# Patient Record
Sex: Female | Born: 1950
Health system: Southern US, Community
[De-identification: ages and names within clinical notes are randomized; demographics above are authoritative.]

## PROBLEM LIST (undated history)

## (undated) DIAGNOSIS — J45909 Unspecified asthma, uncomplicated: Secondary | ICD-10-CM

## (undated) DIAGNOSIS — R51 Headache: Secondary | ICD-10-CM

## (undated) DIAGNOSIS — J189 Pneumonia, unspecified organism: Secondary | ICD-10-CM

## (undated) DIAGNOSIS — R0602 Shortness of breath: Secondary | ICD-10-CM

## (undated) DIAGNOSIS — Z9889 Other specified postprocedural states: Secondary | ICD-10-CM

## (undated) DIAGNOSIS — R112 Nausea with vomiting, unspecified: Secondary | ICD-10-CM

## (undated) DIAGNOSIS — K219 Gastro-esophageal reflux disease without esophagitis: Secondary | ICD-10-CM

## (undated) HISTORY — PX: KNEE SURGERY: SHX244

## (undated) HISTORY — DX: Unspecified asthma, uncomplicated: J45.909

## (undated) HISTORY — PX: ABDOMINAL HYSTERECTOMY: SHX81

## (undated) HISTORY — PX: CHOLECYSTECTOMY: SHX55

## (undated) HISTORY — PX: RHINOPLASTY: SHX2354

---
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
## 1999-04-16 ENCOUNTER — Other Ambulatory Visit: Admission: RE | Admit: 1999-04-16 | Discharge: 1999-04-16 | Payer: Self-pay | Admitting: *Deleted

## 1999-12-31 ENCOUNTER — Encounter: Payer: Self-pay | Admitting: Orthopedic Surgery

## 1999-12-31 ENCOUNTER — Ambulatory Visit (HOSPITAL_COMMUNITY): Admission: RE | Admit: 1999-12-31 | Discharge: 1999-12-31 | Payer: Self-pay | Admitting: Orthopedic Surgery

## 2004-02-20 ENCOUNTER — Other Ambulatory Visit: Admission: RE | Admit: 2004-02-20 | Discharge: 2004-02-20 | Payer: Self-pay | Admitting: *Deleted

## 2005-04-14 ENCOUNTER — Encounter: Admission: RE | Admit: 2005-04-14 | Discharge: 2005-04-14 | Payer: Self-pay | Admitting: Family Medicine

## 2005-04-20 ENCOUNTER — Encounter: Admission: RE | Admit: 2005-04-20 | Discharge: 2005-04-20 | Payer: Self-pay | Admitting: General Surgery

## 2005-05-10 ENCOUNTER — Ambulatory Visit (HOSPITAL_COMMUNITY): Admission: RE | Admit: 2005-05-10 | Discharge: 2005-05-11 | Payer: Self-pay | Admitting: General Surgery

## 2010-03-10 LAB — HM COLONOSCOPY

## 2011-12-09 ENCOUNTER — Encounter (HOSPITAL_COMMUNITY): Payer: Self-pay | Admitting: Emergency Medicine

## 2011-12-09 ENCOUNTER — Emergency Department (HOSPITAL_COMMUNITY)
Admission: EM | Admit: 2011-12-09 | Discharge: 2011-12-09 | Disposition: A | Discharge status: Nursing Home (Includes ICF) | Payer: 59 | Source: Home / Self Care

## 2011-12-09 ENCOUNTER — Inpatient Hospital Stay (HOSPITAL_COMMUNITY)
Admission: EM | Admit: 2011-12-09 | Discharge: 2011-12-11 | DRG: 195 | Disposition: A | Discharge status: Home / Self Care | Payer: 59 | Attending: Internal Medicine | Admitting: Internal Medicine

## 2011-12-09 ENCOUNTER — Emergency Department (INDEPENDENT_AMBULATORY_CARE_PROVIDER_SITE_OTHER): Payer: 59

## 2011-12-09 DIAGNOSIS — J189 Pneumonia, unspecified organism: Secondary | ICD-10-CM

## 2011-12-09 DIAGNOSIS — K219 Gastro-esophageal reflux disease without esophagitis: Secondary | ICD-10-CM | POA: Diagnosis present

## 2011-12-09 DIAGNOSIS — D72829 Elevated white blood cell count, unspecified: Secondary | ICD-10-CM | POA: Diagnosis present

## 2011-12-09 DIAGNOSIS — J154 Pneumonia due to other streptococci: Principal | ICD-10-CM | POA: Diagnosis present

## 2011-12-09 DIAGNOSIS — J45909 Unspecified asthma, uncomplicated: Secondary | ICD-10-CM | POA: Diagnosis present

## 2011-12-09 HISTORY — DX: Nausea with vomiting, unspecified: R11.2

## 2011-12-09 HISTORY — DX: Headache: R51

## 2011-12-09 HISTORY — DX: Gastro-esophageal reflux disease without esophagitis: K21.9

## 2011-12-09 HISTORY — DX: Nausea with vomiting, unspecified: Z98.890

## 2011-12-09 HISTORY — DX: Shortness of breath: R06.02

## 2011-12-09 HISTORY — DX: Pneumonia, unspecified organism: J18.9

## 2011-12-09 LAB — CBC
MCH: 30.6 pg (ref 26.0–34.0)
MCHC: 33.6 g/dL (ref 30.0–36.0)
MCV: 91.2 fL (ref 78.0–100.0)
Platelets: 163 10*3/uL (ref 150–400)
RBC: 4.34 MIL/uL (ref 3.87–5.11)

## 2011-12-09 LAB — DIFFERENTIAL
Basophils Relative: 0 % (ref 0–1)
Eosinophils Absolute: 0 10*3/uL (ref 0.0–0.7)
Eosinophils Relative: 0 % (ref 0–5)
Lymphs Abs: 0.5 10*3/uL — ABNORMAL LOW (ref 0.7–4.0)
Monocytes Relative: 5 % (ref 3–12)

## 2011-12-09 LAB — BASIC METABOLIC PANEL
BUN: 19 mg/dL (ref 6–23)
Calcium: 10.2 mg/dL (ref 8.4–10.5)
GFR calc non Af Amer: 90 mL/min — ABNORMAL LOW (ref >90)
Glucose, Bld: 159 mg/dL — ABNORMAL HIGH (ref 70–99)
Sodium: 135 mEq/L (ref 135–145)

## 2011-12-09 NOTE — ED Notes (Signed)
PT. TRANSFERRED FROM [facility] THIS EVENING - CHEST X-RAY RESULT SHOWS RLL PNEUMONIA , PERSISTENT COUGH FOR 4 WEEKS/SLIGHT SOB , SEEN AT [facility] TODAY RECEIVED NEBULIZER TREATMENT / STEROID INJECTION AND PRESCRIPTION FOR Z-PACK AND PREDNISONE.

## 2011-12-09 NOTE — ED Notes (Signed)
Seen at [facility] today.  Coughing and phlegm for 4 weeks.  Today had an episode of feeling very tired and near syncopal episode while running around doing errands.  No wheezing, has had fever today.  Right lower back pain with attempts to take deep inspirations, also has stuffy ears.

## 2011-12-09 NOTE — ED Notes (Signed)
Offered blankets, pillows, sprite/ice, graham crackers

## 2011-12-09 NOTE — ED Notes (Signed)
Patient received script for z-pack and prednisone.

## 2011-12-09 NOTE — ED Provider Notes (Signed)
History     CSN: [id number]  Arrival date & time 12/09/11  1735   None     Chief Complaint  Patient presents with  . URI    (Consider location/radiation/quality/duration/timing/severity/associated sxs/prior treatment) HPI Comments: Patient presents today requesting a chest x-ray. She states she has had a productive cough with yellow-green sputum for the last 4 weeks. She denies dyspnea but has had some intermittent wheezing. She does have a history of asthma. She states that she was running to [place] today after work and became very lightheaded and felt when she was going to pass out. She was seen at her primary care provider's office and treated with a nebulizer and given an injection of steroids. She states her pulse ox was low at 91%. They prescribed a Z-Pak and oral steroids for her. They also advised her to come to our urgent care to have a chest x-ray performed.   Past Medical History  Diagnosis Date  . Asthma     Past Surgical History  Procedure Date  . Cholecystectomy   . Abdominal hysterectomy   . Rhinoplasty     History reviewed. No pertinent family history.  History  Substance Use Topics  . Smoking status: Never Smoker   . Smokeless tobacco: Not on file  . Alcohol Use: Yes    OB History    Grav Para Term Preterm Abortions TAB SAB Ect Mult Living                  Review of Systems  Constitutional: Negative for fever, chills and fatigue.  HENT: Negative for ear pain, sore throat, rhinorrhea, sneezing, postnasal drip and sinus pressure.   Respiratory: Positive for cough and wheezing. Negative for shortness of breath.   Cardiovascular: Negative for chest pain.  Musculoskeletal: Positive for back pain (Rt mid, with deep inspiration).    Allergies  Codeine; Food; Penicillins; and Sulfa antibiotics  Home Medications   Current Outpatient Rx  Name Route Sig Dispense Refill  . VENTOLIN IN Inhalation Inhale into the lungs.    . ESOMEPRAZOLE MAGNESIUM 20 MG  PO CPDR Oral Take 20 mg by mouth daily before breakfast.    . FLUTICASONE PROPIONATE 50 MCG/ACT NA SUSP Nasal Place 2 sprays into the nose daily.    Marland Kitchen ADVAIR HFA IN Inhalation Inhale into the lungs.    . IBANDRONATE SODIUM 150 MG PO TABS Oral Take 150 mg by mouth every 30 (thirty) days. Take in the morning with a full glass of water, on an empty stomach, and do not take anything else by mouth or lie down for the next 30 min.    Marland Kitchen LORATADINE-D 12HR PO Oral Take by mouth.      BP 122/64  Pulse 102  Temp(Src) 99.2 F (37.3 C) (Oral)  Resp 20  SpO2 94%  Physical Exam  Nursing note and vitals reviewed. Constitutional: She appears well-developed and well-nourished. No distress.  HENT:  Head: Normocephalic and atraumatic.  Right Ear: Tympanic membrane, external ear and ear canal normal.  Left Ear: Tympanic membrane, external ear and ear canal normal.  Nose: Nose normal.  Mouth/Throat: Uvula is midline, oropharynx is clear and moist and mucous membranes are normal. No oropharyngeal exudate, posterior oropharyngeal edema or posterior oropharyngeal erythema.  Neck: Neck supple.  Cardiovascular: Normal rate, regular rhythm and normal heart sounds.   Pulmonary/Chest: Effort normal and breath sounds normal. No respiratory distress.  Lymphadenopathy:    She has no cervical adenopathy.  Neurological: She is  alert.  Skin: Skin is warm and dry.  Psychiatric: She has a normal mood and affect.    ED Course  Procedures (including critical care time)  Labs Reviewed - No data to display Dg Chest 2 View  12/09/2011  *RADIOLOGY REPORT*  Clinical Data: 4-week history of malaise.  Cough with fever.  CHEST - 2 VIEW  Comparison: Chest x-ray 05/04/2005.  Findings: Borderline cardiac enlargement.  Low lung volumes.  Plate- like atelectasis left base.  Focal opacity along the right heart border not present previously likely represents pneumonia.  This may involve the right middle and right lower lobes.  There  is no significant effusion.  There is no visible pneumothorax. Unremarkable aorta.  No acute osseous findings.  IMPRESSION: Interval worsening aeration since 2006.  Right lower lung zone process suspicious for right middle and right lower lobe pneumonia. Mild left base atelectasis.  Original Report Authenticated By: Elsie Stain, M.D.     1. Community acquired pneumonia       MDM  CXR reviewed by radiologist and myself. Pt transferred to ED.        Melody Comas, [place] 12/09/11 2038

## 2011-12-10 ENCOUNTER — Encounter (HOSPITAL_COMMUNITY): Payer: Self-pay | Admitting: Internal Medicine

## 2011-12-10 DIAGNOSIS — J189 Pneumonia, unspecified organism: Secondary | ICD-10-CM | POA: Diagnosis present

## 2011-12-10 DIAGNOSIS — D72829 Elevated white blood cell count, unspecified: Secondary | ICD-10-CM | POA: Diagnosis present

## 2011-12-10 DIAGNOSIS — J45909 Unspecified asthma, uncomplicated: Secondary | ICD-10-CM | POA: Diagnosis present

## 2011-12-10 LAB — COMPREHENSIVE METABOLIC PANEL
ALT: 16 U/L (ref 0–35)
Albumin: 3.5 g/dL (ref 3.5–5.2)
Calcium: 9.7 mg/dL (ref 8.4–10.5)
GFR calc Af Amer: >90 mL/min (ref >90)
Glucose, Bld: 127 mg/dL — ABNORMAL HIGH (ref 70–99)
Potassium: 3.8 mEq/L (ref 3.5–5.1)
Sodium: 135 mEq/L (ref 135–145)
Total Protein: 6.1 g/dL (ref 6.0–8.3)

## 2011-12-10 LAB — CBC
HCT: 34.7 % — ABNORMAL LOW (ref 36.0–46.0)
Hemoglobin: 11.9 g/dL — ABNORMAL LOW (ref 12.0–15.0)
MCV: 91.6 fL (ref 78.0–100.0)
WBC: 12.9 10*3/uL — ABNORMAL HIGH (ref 4.0–10.5)

## 2011-12-10 LAB — DIFFERENTIAL
Eosinophils Relative: 0 % (ref 0–5)
Lymphocytes Relative: 4 % — ABNORMAL LOW (ref 12–46)
Monocytes Absolute: 0.5 10*3/uL (ref 0.1–1.0)
Monocytes Relative: 4 % (ref 3–12)
Neutro Abs: 11.9 10*3/uL — ABNORMAL HIGH (ref 1.7–7.7)

## 2011-12-10 LAB — EXPECTORATED SPUTUM ASSESSMENT W GRAM STAIN, RFLX TO RESP C

## 2011-12-10 MED ORDER — ENOXAPARIN SODIUM 40 MG/0.4ML ~~LOC~~ SOLN
40.0000 mg | SUBCUTANEOUS | Status: DC
  Administered 2011-12-10: 40 mg via SUBCUTANEOUS
  Filled 2011-12-10 (×4): qty 0.4

## 2011-12-10 MED ORDER — LORATADINE 10 MG PO TABS
5.0000 mg | ORAL_TABLET | Freq: Every day | ORAL | Status: DC
  Administered 2011-12-10: 5 mg via ORAL
  Administered 2011-12-11: 11:00:00 via ORAL
  Filled 2011-12-10 (×2): qty 0.5

## 2011-12-10 MED ORDER — ADULT MULTIVITAMIN W/MINERALS CH
1.0000 | ORAL_TABLET | Freq: Every day | ORAL | Status: DC
  Administered 2011-12-10 – 2011-12-11 (×2): 1 via ORAL
  Filled 2011-12-10 (×2): qty 1

## 2011-12-10 MED ORDER — FLUTICASONE-SALMETEROL 250-50 MCG/DOSE IN AEPB
1.0000 | INHALATION_SPRAY | Freq: Two times a day (BID) | RESPIRATORY_TRACT | Status: DC
  Administered 2011-12-10 – 2011-12-11 (×2): 1 via RESPIRATORY_TRACT
  Filled 2011-12-10: qty 14

## 2011-12-10 MED ORDER — SODIUM CHLORIDE 0.9 % IV SOLN
INTRAVENOUS | Status: DC
  Administered 2011-12-10: 75 mL/h via INTRAVENOUS

## 2011-12-10 MED ORDER — LORATADINE-PSEUDOEPHEDRINE ER 5-120 MG PO TB12: 1.0000 | ORAL_TABLET | Freq: Every morning | ORAL | Status: DC

## 2011-12-10 MED ORDER — ONDANSETRON HCL 4 MG/2ML IJ SOLN: 4.0000 mg | Freq: Four times a day (QID) | INTRAMUSCULAR | Status: DC | PRN

## 2011-12-10 MED ORDER — ACETAMINOPHEN 325 MG PO TABS
650.0000 mg | ORAL_TABLET | Freq: Four times a day (QID) | ORAL | Status: DC | PRN
  Administered 2011-12-10: 650 mg via ORAL
  Filled 2011-12-10: qty 2

## 2011-12-10 MED ORDER — ALBUTEROL SULFATE HFA 108 (90 BASE) MCG/ACT IN AERS
2.0000 | INHALATION_SPRAY | Freq: Four times a day (QID) | RESPIRATORY_TRACT | Status: DC | PRN
  Filled 2011-12-10: qty 6.7

## 2011-12-10 MED ORDER — IBANDRONATE SODIUM 150 MG PO TABS: 150.0000 mg | ORAL_TABLET | ORAL | Status: DC

## 2011-12-10 MED ORDER — ALBUTEROL SULFATE (5 MG/ML) 0.5% IN NEBU
5.0000 mg | INHALATION_SOLUTION | RESPIRATORY_TRACT | Status: DC | PRN
  Administered 2011-12-10 (×2): 2.5 mg via RESPIRATORY_TRACT
  Administered 2011-12-11: 5 mg via RESPIRATORY_TRACT
  Filled 2011-12-10 (×3): qty 0.5

## 2011-12-10 MED ORDER — MOXIFLOXACIN HCL IN NACL 400 MG/250ML IV SOLN
400.0000 mg | Freq: Once | INTRAVENOUS | Status: AC
  Administered 2011-12-10: 400 mg via INTRAVENOUS
  Filled 2011-12-10: qty 250

## 2011-12-10 MED ORDER — LORATADINE 10 MG PO TABS: 5.0000 mg | ORAL_TABLET | Freq: Every day | ORAL | Status: DC

## 2011-12-10 MED ORDER — PANTOPRAZOLE SODIUM 40 MG PO TBEC
40.0000 mg | DELAYED_RELEASE_TABLET | Freq: Every day | ORAL | Status: DC
  Administered 2011-12-10 – 2011-12-11 (×2): 40 mg via ORAL
  Filled 2011-12-10 (×2): qty 1

## 2011-12-10 MED ORDER — FLUTICASONE PROPIONATE 50 MCG/ACT NA SUSP
2.0000 | Freq: Every day | NASAL | Status: DC | PRN
  Filled 2011-12-10: qty 16

## 2011-12-10 MED ORDER — PSEUDOEPHEDRINE HCL ER 120 MG PO TB12
120.0000 mg | ORAL_TABLET | Freq: Every day | ORAL | Status: DC
  Administered 2011-12-10 – 2011-12-11 (×2): 120 mg via ORAL
  Filled 2011-12-10 (×2): qty 1

## 2011-12-10 MED ORDER — LEVOFLOXACIN IN D5W 750 MG/150ML IV SOLN
750.0000 mg | INTRAVENOUS | Status: DC
  Administered 2011-12-11: 750 mg via INTRAVENOUS
  Filled 2011-12-10 (×3): qty 150

## 2011-12-10 MED ORDER — PSEUDOEPHEDRINE HCL ER 120 MG PO TB12
120.0000 mg | ORAL_TABLET | Freq: Two times a day (BID) | ORAL | Status: DC
  Filled 2011-12-10 (×2): qty 1

## 2011-12-10 NOTE — Progress Notes (Signed)
PHARMACIST - PHYSICIAN COMMUNICATION  CONCERNING: P&T Medication Policy Regarding Oral Bisphosphonates  RECOMMENDATION: Your order for alendronate (Fosamax), ibandronate (Boniva), or risedronate (Actonel) has been discontinued at this time.  If the patient's post-hospital medical condition warrants safe use of this class of drugs, please resume the pre-hospital regimen upon discharge.  DESCRIPTION:  Alendronate (Fosamax), ibandronate (Boniva), and risedronate (Actonel) can cause severe esophageal erosions in patients who are unable to remain upright at least 30 minutes after taking this medication.   Since brief interruptions in therapy are thought to have minimal impact on bone mineral density, the Pharmacy & Therapeutics Committee has established that bisphosphonate orders should be routinely discontinued during hospitalization.   To override this safety policy and permit administration of Boniva, Fosamax, or Actonel in the hospital, prescribers must write "DO NOT HOLD" in the comments section when placing the order for this class of medications.   Karver Fadden Jonathan  

## 2011-12-10 NOTE — Progress Notes (Signed)
12/10/2011 Newel Oien [facility] Case Management Note [phone]  Utilization review completed.  

## 2011-12-10 NOTE — Progress Notes (Signed)
ANTIBIOTIC CONSULT NOTE - FOLLOW UP  Pharmacy Consult for Rx to renally adjust antibiotics (levofloxacin) Indication: pneumonia  Allergies  Allergen Reactions  . Codeine   . Food   . Penicillins   . Sulfa Antibiotics     Patient Measurements: Weight: 212 lb 11.9 oz (96.5 kg) Adjusted Body Weight:   Vital Signs: Temp: 99.7 F (37.6 C) (03/29 0508) Temp src: Oral (03/29 0508) BP: 104/58 mmHg (03/29 0508) Pulse Rate: 87  (03/29 0508) Intake/Output from previous day: 03/28 0701 - 03/29 0700 In: 120 [P.O.:120] Out: 650 [Urine:650] Intake/Output from this shift:    Labs:  Basename 12/10/11 0328 12/09/11 2136  WBC 12.9* 15.7*  HGB 11.9* 13.3  PLT 141* 163  LABCREA -- --  CREATININE 0.74 0.76   CrCl is unknown because there is no height on file for the current visit. No results found for this basename: VANCOTROUGH:2,VANCOPEAK:2,VANCORANDOM:2,GENTTROUGH:2,GENTPEAK:2,GENTRANDOM:2,TOBRATROUGH:2,TOBRAPEAK:2,TOBRARND:2,AMIKACINPEAK:2,AMIKACINTROU:2,AMIKACIN:2, in the last 72 hours   Microbiology: No results found for this or any previous visit (from the past 720 hour(s)).  Anti-infectives     Start     Dose/Rate Route Frequency Ordered Stop   12/11/11 0000   levofloxacin (LEVAQUIN) IVPB 750 mg        750 mg 100 mL/hr over 90 Minutes Intravenous Every 24 hours 12/10/11 0314 12/15/11 2359   12/10/11 0015   moxifloxacin (AVELOX) IVPB 400 mg        400 mg 250 mL/hr over 60 Minutes Intravenous  Once 12/10/11 0012 12/10/11 0153          Assessment: 60 YOF admitted SOB, Cough, Fever.  Started levofloxacin 750mg  IV q24h for CAP.  S. Pneumoniae urine Ag is positive. BC pending.  WBC inc at admit, afeb, renal fx WNL.    Plan:  1. Continue levofoxacin 750mg  IV q24h, await blood cultures. Rx to follow at distance as no intervention needed at this time.   Dannielle Huh 12/10/2011,9:10 AM

## 2011-12-10 NOTE — Progress Notes (Signed)
Subjective: Patient seen and examined this morning. informs her breathing to be better  Objective:  Vital signs in last 24 hours:  Filed Vitals:   12/10/11 0215 12/10/11 0254 12/10/11 0508 12/10/11 0917  BP: 112/61 114/71 104/58   Pulse: 87 91 87   Temp:  99.9 F (37.7 C) 99.7 F (37.6 C)   TempSrc:  Oral Oral   Resp:  20 20   Weight:  96.5 kg (212 lb 11.9 oz)    SpO2: 98% 98% 97% 97%    Intake/Output from previous day:   Intake/Output Summary (Last 24 hours) at 12/10/11 1307 Last data filed at 12/10/11 0456  Gross per 24 hour  Intake    120 ml  Output    650 ml  Net   -530 ml    Physical Exam:  General: elderly female  in no acute distress. HEENT: no pallor, no icterus, moist oral mucosa, no JVD, no lymphadenopathy Heart: Normal  s1 &s2  Regular rate and rhythm, without murmurs, rubs, gallops. Lungs: coarse crackles over rt side, no added sounds Abdomen: Soft, nontender, nondistended, positive bowel sounds. Extremities: No clubbing cyanosis or edema with positive pedal pulses. Neuro: Alert, awake, oriented x3, nonfocal.   Lab Results:  Basic Metabolic Panel:    Component Value Date/Time   NA 135 12/10/2011 0328   K 3.8 12/10/2011 0328   CL 101 12/10/2011 0328   CO2 24 12/10/2011 0328   BUN 17 12/10/2011 0328   CREATININE 0.74 12/10/2011 0328   GLUCOSE 127* 12/10/2011 0328   CALCIUM 9.7 12/10/2011 0328   CBC:    Component Value Date/Time   WBC 12.9* 12/10/2011 0328   HGB 11.9* 12/10/2011 0328   HCT 34.7* 12/10/2011 0328   PLT 141* 12/10/2011 0328   MCV 91.6 12/10/2011 0328   NEUTROABS 11.9* 12/10/2011 0328   LYMPHSABS 0.5* 12/10/2011 0328   MONOABS 0.5 12/10/2011 0328   EOSABS 0.0 12/10/2011 0328   BASOSABS 0.0 12/10/2011 0328    No results found for this or any previous visit (from the past 240 hour(s)).  Studies/Results: Dg Chest 2 View  12/09/2011  *RADIOLOGY REPORT*  Clinical Data: 4-week history of malaise.  Cough with fever.  CHEST - 2 VIEW  Comparison:  Chest x-ray 05/04/2005.  Findings: Borderline cardiac enlargement.  Low lung volumes.  Plate- like atelectasis left base.  Focal opacity along the right heart border not present previously likely represents pneumonia.  This may involve the right middle and right lower lobes.  There is no significant effusion.  There is no visible pneumothorax. Unremarkable aorta.  No acute osseous findings.  IMPRESSION: Interval worsening aeration since 2006.  Right lower lung zone process suspicious for right middle and right lower lobe pneumonia. Mild left base atelectasis.  Original Report Authenticated By: Elsie Stain, M.D.    Medications: Scheduled Meds:   . enoxaparin  40 mg Subcutaneous Q24H  . Fluticasone-Salmeterol  1 puff Inhalation Q12H  . levofloxacin (LEVAQUIN) IV  750 mg Intravenous Q24H  . pseudoephedrine  120 mg Oral Daily   And  . loratadine  5 mg Oral Daily  . moxifloxacin  400 mg Intravenous Once  . mulitivitamin with minerals  1 tablet Oral Daily  . pantoprazole  40 mg Oral Daily  . DISCONTD: ibandronate  150 mg Oral Q30 days  . DISCONTD: loratadine  5 mg Oral Daily  . DISCONTD: loratadine-pseudoephedrine  1 tablet Oral q morning - 10a  . DISCONTD: pseudoephedrine  120 mg Oral  BID   Continuous Infusions:   . sodium chloride 75 mL/hr (12/10/11 0350)   PRN Meds:.acetaminophen, albuterol, albuterol, fluticasone, ondansetron  Assessment 61 y/o female with asthma admitted with SOB and cough and fever secondary to strep pneumonia  Plan:  *CAP (community acquired pneumonia) Strep pneumonia ag positive Started on levaquin and will continue Leucocytosis improving  d/c fluids   Asthma  cont advair  DVT prophylaxis  Regular diet  Full code  D/c home in am if stable    LOS: 1 day   Cyris Maalouf 12/10/2011, 1:07 PM

## 2011-12-10 NOTE — Progress Notes (Signed)
   CARE MANAGEMENT NOTE 12/10/2011  Patient:  Cindy Rogers, Cindy Rogers   Account Number:  [id number]  Date Initiated:  12/10/2011  Documentation initiated by:  Donn Pierini  Subjective/Objective Assessment:   Pt admitted with PNA     Action/Plan:   PTA pt lived at home with spouse, was independent with ADLs   Anticipated DC Date:  12/11/2011   Anticipated DC Plan:  HOME/SELF CARE      DC Planning Services  CM consult      Choice offered to / List presented to:             Status of service:  In process, will continue to follow Medicare Important Message given?   (If response is "NO", the following Medicare IM given date fields will be blank) Date Medicare IM given:   Date Additional Medicare IM given:    Discharge Disposition:    Per UR Regulation:    If discussed at Long Length of Stay Meetings, dates discussed:    Comments:  PCP- Claude Manges- [place] Sprang- [place]. J.  12/10/11- 1420- Donn Pierini RN, BSN [phone] Spoke with pt at bedside- per conversation pt states she feels stronger today-has been up to bathroom. Pt reports that she uses [facility] for medications and has medication benefits- pt will have transportation home. No anticipated d/c needs.  CM to  follow.

## 2011-12-10 NOTE — ED Notes (Addendum)
Pt stated that she has been having a cough x 1 week. She went to her primary MD who sent her to Urgent Care to for a chest Xray. According to Urgent Care she has PNA and they sent her to the ED. Currently no respiratory distress. Pt is on room air. Will continue to monitor.

## 2011-12-10 NOTE — ED Provider Notes (Signed)
History     CSN: [id number]  Arrival date & time 12/09/11  2118   First MD Initiated Contact with Patient 12/10/11 0011      Chief Complaint  Patient presents with  . Cough    (Consider location/radiation/quality/duration/timing/severity/associated sxs/prior treatment) The history is provided by the patient and the spouse.   not feeling well for the last few weeks with cough and malaise, the last 2 days developed fever with productive sputum and worsening cough. Saw primary care physician this morning, was sent for chest x-ray and told to come the emergency department to be admitted to the hospital. Some shortness of breath. No hemoptysis. No chest pains. No leg pain or leg swelling. No sick contacts at home. Moderate in severity. Breathing treatment was given with steroids at her doctor's office. H/o asthma. Wheezes feel improved at time of my evaluation.  Past Medical History  Diagnosis Date  . Asthma     Past Surgical History  Procedure Date  . Cholecystectomy   . Abdominal hysterectomy   . Rhinoplasty     No family history on file.  History  Substance Use Topics  . Smoking status: Never Smoker   . Smokeless tobacco: Not on file  . Alcohol Use: Yes    OB History    Grav Para Term Preterm Abortions TAB SAB Ect Mult Living                  Review of Systems  Constitutional: Positive for fever.  HENT: Negative for neck pain and neck stiffness.   Eyes: Negative for pain.  Respiratory: Positive for cough and shortness of breath.   Cardiovascular: Negative for chest pain.  Gastrointestinal: Negative for abdominal pain.  Genitourinary: Negative for dysuria.  Musculoskeletal: Negative for back pain.  Skin: Negative for rash.  Neurological: Negative for headaches.  All other systems reviewed and are negative.    Allergies  Codeine; Food; Penicillins; and Sulfa antibiotics  Home Medications   Current Outpatient Rx  Name Route Sig Dispense Refill  .  ALBUTEROL SULFATE HFA 108 (90 BASE) MCG/ACT IN AERS Inhalation Inhale 2 puffs into the lungs every 6 (six) hours as needed. For shortness of breath    . CALCIUM CARBONATE-VITAMIN D 500-200 MG-UNIT PO TABS Oral Take 1 tablet by mouth daily.    Marland Kitchen ESOMEPRAZOLE MAGNESIUM 20 MG PO CPDR Oral Take 20 mg by mouth daily before breakfast.    . OMEGA-3 FATTY ACIDS 1000 MG PO CAPS Oral Take 2 g by mouth daily.    Marland Kitchen FLUTICASONE PROPIONATE 50 MCG/ACT NA SUSP Nasal Place 2 sprays into the nose daily as needed. For allergies    . FLUTICASONE-SALMETEROL 250-50 MCG/DOSE IN AEPB Inhalation Inhale 1 puff into the lungs every 12 (twelve) hours.    . IBANDRONATE SODIUM 150 MG PO TABS Oral Take 150 mg by mouth every 30 (thirty) days. Take in the morning with a full glass of water, on an empty stomach, and do not take anything else by mouth or lie down for the next 30 min.    Marland Kitchen LORATADINE-D 12HR PO Oral Take 1 tablet by mouth daily.     . ADULT MULTIVITAMIN W/MINERALS CH Oral Take 1 tablet by mouth daily.      BP 105/67  Pulse 84  Temp(Src) 98.8 F (37.1 C) (Oral)  Resp 20  SpO2 93%  Physical Exam  Constitutional: She is oriented to person, place, and time. She appears well-developed and well-nourished.  HENT:  Head:  Normocephalic and atraumatic.  Eyes: Conjunctivae and EOM are normal. Pupils are equal, round, and reactive to light.  Neck: Trachea normal. Neck supple. No thyromegaly present.  Cardiovascular: Normal rate, regular rhythm, S1 normal, S2 normal and normal pulses.     No systolic murmur is present   No diastolic murmur is present  Pulses:      Radial pulses are 2+ on the right side, and 2+ on the left side.  Pulmonary/Chest: She has no wheezes. She has no rhonchi. She has no rales. She exhibits no tenderness.       Mildly coarse breath sounds on the right without wheezes. Mild tachypnea  Abdominal: Soft. Normal appearance and bowel sounds are normal. There is no tenderness. There is no CVA  tenderness and negative Murphy's sign.  Musculoskeletal:       BLE:s Calves nontender, no cords or erythema, negative Homans sign  Neurological: She is alert and oriented to person, place, and time. She has normal strength. No cranial nerve deficit or sensory deficit. GCS eye subscore is 4. GCS verbal subscore is 5. GCS motor subscore is 6.  Skin: Skin is warm and dry. No rash noted. She is not diaphoretic.  Psychiatric: Her speech is normal.       Cooperative and appropriate    ED Course  Procedures (including critical care time)  Labs Reviewed  CBC - Abnormal; Notable for the following:    WBC 15.7 (*)    All other components within normal limits  DIFFERENTIAL - Abnormal; Notable for the following:    Neutrophils Relative 92 (*)    Neutro Abs 14.4 (*)    Lymphocytes Relative 3 (*)    Lymphs Abs 0.5 (*)    All other components within normal limits  BASIC METABOLIC PANEL - Abnormal; Notable for the following:    Glucose, Bld 159 (*)    GFR calc non Af Amer 90 (*)    All other components within normal limits   Dg Chest 2 View  12/09/2011  *RADIOLOGY REPORT*  Clinical Data: 4-week history of malaise.  Cough with fever.  CHEST - 2 VIEW  Comparison: Chest x-ray 05/04/2005.  Findings: Borderline cardiac enlargement.  Low lung volumes.  Plate- like atelectasis left base.  Focal opacity along the right heart border not present previously likely represents pneumonia.  This may involve the right middle and right lower lobes.  There is no significant effusion.  There is no visible pneumothorax. Unremarkable aorta.  No acute osseous findings.  IMPRESSION: Interval worsening aeration since 2006.  Right lower lung zone process suspicious for right middle and right lower lobe pneumonia. Mild left base atelectasis.  Original Report Authenticated By: Elsie Stain, M.D.     1. Community acquired pneumonia    1:20 AM is discussed as above with Dr. Mikeal Hawthorne, for hypoxia pulse ox 91% room air and mild  respiratory distress with minimal wheezes and history of asthma. IV antibiotics and medical admission.    MDM   Community acquired pneumonia with associated hypoxia. Plan admit medicine        Sunnie Nielsen, MD 12/10/11 [phone]

## 2011-12-10 NOTE — ED Notes (Signed)
Placed on 2L O2 N/C. Will continue to monitor.

## 2011-12-10 NOTE — H&P (Signed)
Cindy Rogers is an 61 y.o. female.   Chief Complaint: SOB HPI: 62 yo with SOB, cough and fever. No chest pain. Patient has no sick contact. No NVD. Has history of asthma and GERD.  Past Medical History  Diagnosis Date  . Asthma   . PONV (postoperative nausea and vomiting)   . Shortness of breath   . Pneumonia   . GERD (gastroesophageal reflux disease)   . Headache     Past Surgical History  Procedure Date  . Cholecystectomy   . Abdominal hysterectomy   . Rhinoplasty     History reviewed. No pertinent family history. Social History:  reports that she has never smoked. She has never used smokeless tobacco. She reports that she drinks alcohol. She reports that she does not use illicit drugs.  Allergies:  Allergies  Allergen Reactions  . Codeine   . Food   . Penicillins   . Sulfa Antibiotics     Medications Prior to Admission  Medication Dose Route Frequency Provider Last Rate Last Dose  . 0.9 %  sodium chloride infusion   Intravenous Continuous Rometta Emery, MD 75 mL/hr at 12/10/11 0350 75 mL/hr at 12/10/11 0350  . acetaminophen (TYLENOL) tablet 650 mg  650 mg Oral Q6H PRN Caroline More, NP      . albuterol (PROVENTIL HFA;VENTOLIN HFA) 108 (90 BASE) MCG/ACT inhaler 2 puff  2 puff Inhalation Q6H PRN Rometta Emery, MD      . albuterol (PROVENTIL) (5 MG/ML) 0.5% nebulizer solution 5 mg  5 mg Nebulization Q4H PRN Sunnie Nielsen, MD      . enoxaparin (LOVENOX) injection 40 mg  40 mg Subcutaneous Q24H Rometta Emery, MD      . fluticasone (FLONASE) 50 MCG/ACT nasal spray 2 spray  2 spray Each Nare Daily PRN Rometta Emery, MD      . Fluticasone-Salmeterol (ADVAIR) 250-50 MCG/DOSE inhaler 1 puff  1 puff Inhalation Q12H Rometta Emery, MD      . levofloxacin (LEVAQUIN) IVPB 750 mg  750 mg Intravenous Q24H Rometta Emery, MD      . pseudoephedrine (SUDAFED) 12 hr tablet 120 mg  120 mg Oral Daily Nishant Dhungel, MD       And  . loratadine (CLARITIN) tablet 5 mg  5 mg  Oral Daily Nishant Dhungel, MD      . moxifloxacin (AVELOX) IVPB 400 mg  400 mg Intravenous Once Sunnie Nielsen, MD   400 mg at 12/10/11 0053  . mulitivitamin with minerals tablet 1 tablet  1 tablet Oral Daily Rometta Emery, MD      . ondansetron [facility]) injection 4 mg  4 mg Intravenous Q6H PRN Sunnie Nielsen, MD      . pantoprazole (PROTONIX) EC tablet 40 mg  40 mg Oral Daily Rometta Emery, MD      . DISCONTD: ibandronate (BONIVA) tablet 150 mg  150 mg Oral Q30 days Rometta Emery, MD      . DISCONTD: loratadine (CLARITIN) tablet 5 mg  5 mg Oral Daily Nishant Dhungel, MD      . DISCONTD: loratadine-pseudoephedrine (CLARITIN-D 12-hour) 5-120 MG per 12 hr tablet 1 tablet  1 tablet Oral q morning - 10a Rometta Emery, MD      . DISCONTD: pseudoephedrine (SUDAFED) 12 hr tablet 120 mg  120 mg Oral BID Eddie North, MD       No current outpatient prescriptions on file as of 12/10/2011.  Results for orders placed during the hospital encounter of 12/09/11 (from the past 48 hour(s))  CBC     Status: Abnormal   Collection Time   12/09/11  9:36 PM      Component Value Range Comment   WBC 15.7 (*) 4.0 - 10.5 (K/uL)    RBC 4.34  3.87 - 5.11 (MIL/uL)    Hemoglobin 13.3  12.0 - 15.0 (g/dL)    HCT 16.1  09.6 - 04.5 (%)    MCV 91.2  78.0 - 100.0 (fL)    MCH 30.6  26.0 - 34.0 (pg)    MCHC 33.6  30.0 - 36.0 (g/dL)    RDW 40.9  81.1 - 91.4 (%)    Platelets 163  150 - 400 (K/uL)   DIFFERENTIAL     Status: Abnormal   Collection Time   12/09/11  9:36 PM      Component Value Range Comment   Neutrophils Relative 92 (*) 43 - 77 (%)    Neutro Abs 14.4 (*) 1.7 - 7.7 (K/uL)    Lymphocytes Relative 3 (*) 12 - 46 (%)    Lymphs Abs 0.5 (*) 0.7 - 4.0 (K/uL)    Monocytes Relative 5  3 - 12 (%)    Monocytes Absolute 0.7  0.1 - 1.0 (K/uL)    Eosinophils Relative 0  0 - 5 (%)    Eosinophils Absolute 0.0  0.0 - 0.7 (K/uL)    Basophils Relative 0  0 - 1 (%)    Basophils Absolute 0.0  0.0 - 0.1 (K/uL)     BASIC METABOLIC PANEL     Status: Abnormal   Collection Time   12/09/11  9:36 PM      Component Value Range Comment   Sodium 135  135 - 145 (mEq/L)    Potassium 4.0  3.5 - 5.1 (mEq/L)    Chloride 98  96 - 112 (mEq/L)    CO2 26  19 - 32 (mEq/L)    Glucose, Bld 159 (*) 70 - 99 (mg/dL)    BUN 19  6 - 23 (mg/dL)    Creatinine, Ser 7.82  0.50 - 1.10 (mg/dL)    Calcium 95.6  8.4 - 10.5 (mg/dL)    GFR calc non Af Amer 90 (*) >90 (mL/min)    GFR calc Af Amer >90  >90 (mL/min)   COMPREHENSIVE METABOLIC PANEL     Status: Abnormal   Collection Time   12/10/11  3:28 AM      Component Value Range Comment   Sodium 135  135 - 145 (mEq/L)    Potassium 3.8  3.5 - 5.1 (mEq/L)    Chloride 101  96 - 112 (mEq/L)    CO2 24  19 - 32 (mEq/L)    Glucose, Bld 127 (*) 70 - 99 (mg/dL)    BUN 17  6 - 23 (mg/dL)    Creatinine, Ser 2.13  0.50 - 1.10 (mg/dL)    Calcium 9.7  8.4 - 10.5 (mg/dL)    Total Protein 6.1  6.0 - 8.3 (g/dL)    Albumin 3.5  3.5 - 5.2 (g/dL)    AST 14  0 - 37 (U/L)    ALT 16  0 - 35 (U/L)    Alkaline Phosphatase 44  39 - 117 (U/L)    Total Bilirubin 1.1  0.3 - 1.2 (mg/dL)    GFR calc non Af Amer >90  >90 (mL/min)    GFR calc Af Amer >90  >90 (  mL/min)   CBC     Status: Abnormal   Collection Time   12/10/11  3:28 AM      Component Value Range Comment   WBC 12.9 (*) 4.0 - 10.5 (K/uL)    RBC 3.79 (*) 3.87 - 5.11 (MIL/uL)    Hemoglobin 11.9 (*) 12.0 - 15.0 (g/dL)    HCT 04.5 (*) 40.9 - 46.0 (%)    MCV 91.6  78.0 - 100.0 (fL)    MCH 31.4  26.0 - 34.0 (pg)    MCHC 34.3  30.0 - 36.0 (g/dL)    RDW 81.1  91.4 - 78.2 (%)    Platelets 141 (*) 150 - 400 (K/uL)   DIFFERENTIAL     Status: Abnormal   Collection Time   12/10/11  3:28 AM      Component Value Range Comment   Neutrophils Relative 92 (*) 43 - 77 (%)    Neutro Abs 11.9 (*) 1.7 - 7.7 (K/uL)    Lymphocytes Relative 4 (*) 12 - 46 (%)    Lymphs Abs 0.5 (*) 0.7 - 4.0 (K/uL)    Monocytes Relative 4  3 - 12 (%)    Monocytes Absolute  0.5  0.1 - 1.0 (K/uL)    Eosinophils Relative 0  0 - 5 (%)    Eosinophils Absolute 0.0  0.0 - 0.7 (K/uL)    Basophils Relative 0  0 - 1 (%)    Basophils Absolute 0.0  0.0 - 0.1 (K/uL)   STREP PNEUMONIAE URINARY ANTIGEN     Status: Abnormal   Collection Time   12/10/11  4:55 AM      Component Value Range Comment   Strep Pneumo Urinary Antigen POSITIVE (*) NEGATIVE     Dg Chest 2 View  12/09/2011  *RADIOLOGY REPORT*  Clinical Data: 4-week history of malaise.  Cough with fever.  CHEST - 2 VIEW  Comparison: Chest x-ray 05/04/2005.  Findings: Borderline cardiac enlargement.  Low lung volumes.  Plate- like atelectasis left base.  Focal opacity along the right heart border not present previously likely represents pneumonia.  This may involve the right middle and right lower lobes.  There is no significant effusion.  There is no visible pneumothorax. Unremarkable aorta.  No acute osseous findings.  IMPRESSION: Interval worsening aeration since 2006.  Right lower lung zone process suspicious for right middle and right lower lobe pneumonia. Mild left base atelectasis.  Original Report Authenticated By: Elsie Stain, M.D.    Review of Systems  Constitutional: Positive for fever.  Eyes: Negative.   Respiratory: Positive for cough, sputum production, shortness of breath and wheezing. Negative for hemoptysis.   Skin: Negative.   All other systems reviewed and are negative.    Blood pressure 104/58, pulse 87, temperature 99.7 F (37.6 C), temperature source Oral, resp. rate 20, weight 96.5 kg (212 lb 11.9 oz), SpO2 97.00%. Physical Exam  Constitutional: She is oriented to person, place, and time. She appears well-developed and well-nourished.  HENT:  Head: Normocephalic and atraumatic.  Right Ear: External ear normal.  Left Ear: External ear normal.  Nose: Nose normal.  Mouth/Throat: Oropharynx is clear and moist.  Eyes: Conjunctivae and EOM are normal. Pupils are equal, round, and reactive to  light.  Neck: Normal range of motion. Neck supple.  Cardiovascular: Normal rate, regular rhythm, normal heart sounds and intact distal pulses.   Respiratory: She is in respiratory distress. She has no wheezes. She has rales. She exhibits no tenderness.  GI:  Soft. Bowel sounds are normal.  Musculoskeletal: Normal range of motion.  Neurological: She is alert and oriented to person, place, and time. She has normal reflexes.  Skin: Skin is dry.  Psychiatric: She has a normal mood and affect. Her behavior is normal. Judgment and thought content normal.     Assessment/Plan 1. Pneumonia: Seems CAP. Will admit and start Levaquine since patient is Penicillin allergic 2. Asthma: no exacerbation: Empiric nebulizers only 3. Leucocytosis: Due to Pneumonia  Xavior Niazi,LAWAL 12/10/2011, 6:53 AM

## 2011-12-11 DIAGNOSIS — J154 Pneumonia due to other streptococci: Secondary | ICD-10-CM | POA: Diagnosis present

## 2011-12-11 MED ORDER — GUAIFENESIN-DM 100-10 MG/5ML PO SYRP: 5.0000 mL | ORAL_SOLUTION | Freq: Three times a day (TID) | ORAL | Status: AC | PRN

## 2011-12-11 MED ORDER — LEVOFLOXACIN 750 MG PO TABS: 750.0000 mg | ORAL_TABLET | Freq: Every day | ORAL | Status: AC

## 2011-12-11 NOTE — Discharge Summary (Signed)
Patient ID: Cindy Rogers MRN: [id number] DOB/AGE: 11-18-1950 61 y.o.  Admit date: 12/09/2011 Discharge date: 12/11/2011  Primary Care Physician:  Claude Manges, FNP, FNP  Discharge Diagnoses:     *CAP (community acquired streptococcal pneumonia)  Asthma   Medication List  As of 12/11/2011  9:57 AM   TAKE these medications         albuterol 108 (90 BASE) MCG/ACT inhaler   Commonly known as: PROVENTIL HFA;VENTOLIN HFA   Inhale 2 puffs into the lungs every 6 (six) hours as needed. For shortness of breath      calcium-vitamin D 500-200 MG-UNIT per tablet   Commonly known as: OSCAL WITH D   Take 1 tablet by mouth daily.      fish oil-omega-3 fatty acids 1000 MG capsule   Take 2 g by mouth daily.      fluticasone 50 MCG/ACT nasal spray   Commonly known as: FLONASE   Place 2 sprays into the nose daily as needed. For allergies      Fluticasone-Salmeterol 250-50 MCG/DOSE Aepb   Commonly known as: ADVAIR   Inhale 1 puff into the lungs every 12 (twelve) hours.      guaiFENesin-dextromethorphan 100-10 MG/5ML syrup   Commonly known as: ROBITUSSIN DM   Take 5 mLs by mouth 3 (three) times daily as needed for cough.      ibandronate 150 MG tablet   Commonly known as: BONIVA   Take 150 mg by mouth every 30 (thirty) days. Take in the morning with a full glass of water, on an empty stomach, and do not take anything else by mouth or lie down for the next 30 min.      levofloxacin 750 MG tablet   Commonly known as: LEVAQUIN   Take 1 tablet (750 mg total) by mouth daily.   Start taking on: 12/12/2011      LORATADINE-D 12HR PO   Take 1 tablet by mouth daily.      mulitivitamin with minerals Tabs   Take 1 tablet by mouth daily.         ASK your doctor about these medications         esomeprazole 20 MG capsule   Commonly known as: NEXIUM   Take 20 mg by mouth daily before breakfast.            Disposition and Follow-up:  D/c home with PCP follow up in 1 week  Consults:   none  Significant Diagnostic Studies:  Clinical Data: 4-week history of malaise. Cough with fever.  CHEST - 2 VIEW  Comparison: Chest x-ray 05/04/2005.  Findings: Borderline cardiac enlargement. Low lung volumes. Plate-  like atelectasis left base. Focal opacity along the right heart  border not present previously likely represents pneumonia. This  may involve the right middle and right lower lobes. There is no  significant effusion. There is no visible pneumothorax.  Unremarkable aorta. No acute osseous findings.  IMPRESSION:  Interval worsening aeration since 2006. Right lower lung zone  process suspicious for right middle and right lower lobe pneumonia.  Mild left base atelectasis.   Brief H and P: For complete details please refer to admission H and P, but in brief 61 y/o female with hx of asthma, HL presented to the ED with subjective fever, chills and productive cough for past few days with progressive shortness of breath. She denies any chest pain or palpitations. Denies any sick contacts. She was noted to have a rt sided infiltrate on CXR  suggestive of PNA along with leucocytosis and low grade temperature and admitted to hospitalist service.   Physical Exam on Discharge:  Filed Vitals:   12/10/11 1951 12/10/11 2115 12/11/11 0500 12/11/11 0858  BP:  112/84 102/57   Pulse: 79 92 73   Temp:  97.3 F (36.3 C) 99.5 F (37.5 C)   TempSrc:  Oral Oral   Resp: 20 18 20    Weight:      SpO2:  93% 94% 96%     Intake/Output Summary (Last 24 hours) at 12/11/11 0957 Last data filed at 12/11/11 0900  Gross per 24 hour  Intake    600 ml  Output   2450 ml  Net  -1850 ml    General: Alert, awake, oriented x3, in no acute distress. HEENT: No bruits, no goiter. Heart: Regular rate and rhythm, without murmurs, rubs, gallops. Lungs: Clear to auscultation bilaterally. Abdomen: Soft, nontender, nondistended, positive bowel sounds. Extremities: No clubbing cyanosis or edema with  positive pedal pulses. Neuro: Grossly intact, nonfocal.  CBC:    Component Value Date/Time   WBC 12.9* 12/10/2011 0328   HGB 11.9* 12/10/2011 0328   HCT 34.7* 12/10/2011 0328   PLT 141* 12/10/2011 0328   MCV 91.6 12/10/2011 0328   NEUTROABS 11.9* 12/10/2011 0328   LYMPHSABS 0.5* 12/10/2011 0328   MONOABS 0.5 12/10/2011 0328   EOSABS 0.0 12/10/2011 0328   BASOSABS 0.0 12/10/2011 0328    Basic Metabolic Panel:    Component Value Date/Time   NA 135 12/10/2011 0328   K 3.8 12/10/2011 0328   CL 101 12/10/2011 0328   CO2 24 12/10/2011 0328   BUN 17 12/10/2011 0328   CREATININE 0.74 12/10/2011 0328   GLUCOSE 127* 12/10/2011 0328   CALCIUM 9.7 12/10/2011 0328    Hospital Course:    *CAP (community acquired pneumonia) Patient admitted to medical floor.  she was started on empiric abx with levaquin for community acquired PNA Blood cultures were sent on admission and negative for growth on preliminary read  streptococcal ag was positive  patient was also given IV fluids and nebs with improvement in her symptoms of SOB  she remained afebrile and follow up wbc showed improvement in leucocytosis.  patient today is afebrile and c/o productive cough but improving. Her lung exam is clear and she can be discharged home with outpatient follow up.     Time spent on Discharge: 45 minutes  Signed: Eddie North 12/11/2011, 9:57 AM

## 2011-12-13 NOTE — ED Provider Notes (Signed)
Medical screening examination/treatment/procedure(s) were performed by non-physician practitioner and as supervising physician I was immediately available for consultation/collaboration.  Luiz Blare MD   Luiz Blare, MD 12/13/11 [phone]

## 2011-12-16 LAB — CULTURE, BLOOD (ROUTINE X 2)
Culture  Setup Time: 201303290825
Culture  Setup Time: 201303290825
Culture: NO GROWTH

## 2011-12-23 ENCOUNTER — Ambulatory Visit
Admission: RE | Admit: 2011-12-23 | Discharge: 2011-12-23 | Disposition: A | Discharge status: Home / Self Care | Payer: 59 | Source: Ambulatory Visit | Attending: Family Medicine | Admitting: Family Medicine

## 2011-12-23 ENCOUNTER — Other Ambulatory Visit: Payer: Self-pay | Admitting: Family Medicine

## 2011-12-23 DIAGNOSIS — J189 Pneumonia, unspecified organism: Secondary | ICD-10-CM

## 2012-08-24 ENCOUNTER — Emergency Department (HOSPITAL_COMMUNITY)
Admission: EM | Admit: 2012-08-24 | Discharge: 2012-08-24 | Disposition: A | Discharge status: Home / Self Care | Payer: 59 | Attending: Emergency Medicine | Admitting: Emergency Medicine

## 2012-08-24 ENCOUNTER — Emergency Department (HOSPITAL_COMMUNITY): Payer: 59

## 2012-08-24 ENCOUNTER — Encounter (HOSPITAL_COMMUNITY): Payer: Self-pay | Admitting: Emergency Medicine

## 2012-08-24 DIAGNOSIS — Z79899 Other long term (current) drug therapy: Secondary | ICD-10-CM | POA: Insufficient documentation

## 2012-08-24 DIAGNOSIS — Z8701 Personal history of pneumonia (recurrent): Secondary | ICD-10-CM | POA: Insufficient documentation

## 2012-08-24 DIAGNOSIS — J45909 Unspecified asthma, uncomplicated: Secondary | ICD-10-CM | POA: Insufficient documentation

## 2012-08-24 DIAGNOSIS — K219 Gastro-esophageal reflux disease without esophagitis: Secondary | ICD-10-CM | POA: Insufficient documentation

## 2012-08-24 DIAGNOSIS — Y92009 Unspecified place in unspecified non-institutional (private) residence as the place of occurrence of the external cause: Secondary | ICD-10-CM | POA: Insufficient documentation

## 2012-08-24 DIAGNOSIS — S82409A Unspecified fracture of shaft of unspecified fibula, initial encounter for closed fracture: Secondary | ICD-10-CM

## 2012-08-24 DIAGNOSIS — Y939 Activity, unspecified: Secondary | ICD-10-CM | POA: Insufficient documentation

## 2012-08-24 DIAGNOSIS — S82899A Other fracture of unspecified lower leg, initial encounter for closed fracture: Secondary | ICD-10-CM | POA: Insufficient documentation

## 2012-08-24 DIAGNOSIS — X500XXA Overexertion from strenuous movement or load, initial encounter: Secondary | ICD-10-CM | POA: Insufficient documentation

## 2012-08-24 MED ORDER — OXYCODONE-ACETAMINOPHEN 5-325 MG PO TABS
1.0000 | ORAL_TABLET | Freq: Once | ORAL | Status: AC
  Administered 2012-08-24: 1 via ORAL
  Filled 2012-08-24: qty 1

## 2012-08-24 MED ORDER — OXYCODONE-ACETAMINOPHEN 5-325 MG PO TABS: 2.0000 | ORAL_TABLET | ORAL | Status: DC | PRN

## 2012-08-24 NOTE — ED Notes (Signed)
Patient claims was in parking lot and fell on ice.  Patient claims that she "twisted the ankle, heard something snap and then I just sat down".

## 2012-08-24 NOTE — Progress Notes (Signed)
Orthopedic Tech Progress Note Patient Details:  Cindy Rogers 06-28-1951 [id number] Post. Short leg splint applied to left LE. Patient also fitted for crutches according to height and comfort. Crutch instruction given.  Ortho Devices Type of Ortho Device: Post (short) splint Ortho Device/Splint Location: Left LE Ortho Device/Splint Interventions: Application   Asia R Thompson 08/24/2012, 12:27 PM

## 2012-08-24 NOTE — ED Provider Notes (Signed)
History     CSN: [id number]  Arrival date & time 08/24/12  5784   First MD Initiated Contact with Patient 08/24/12 [phone]      Chief Complaint  Patient presents with  . Ankle Pain    (Consider location/radiation/quality/duration/timing/severity/associated sxs/prior treatment) Patient is a 61 y.o. female presenting with ankle pain. The history is provided by the patient and the spouse. No language interpreter was used.  Ankle Pain  The incident occurred 3 to 5 hours ago. The incident occurred at home. The injury mechanism was a fall.    Past Medical History  Diagnosis Date  . Asthma   . PONV (postoperative nausea and vomiting)   . Shortness of breath   . Pneumonia   . GERD (gastroesophageal reflux disease)   . Headache     Past Surgical History  Procedure Date  . Cholecystectomy   . Abdominal hysterectomy   . Rhinoplasty     No family history on file.  History  Substance Use Topics  . Smoking status: Never Smoker   . Smokeless tobacco: Never Used  . Alcohol Use: Yes    OB History    Grav Para Term Preterm Abortions TAB SAB Ect Mult Living                  Review of Systems  Allergies  Food; Codeine; Penicillins; and Sulfa antibiotics  Home Medications   Current Outpatient Rx  Name  Route  Sig  Dispense  Refill  . ALBUTEROL SULFATE HFA 108 (90 BASE) MCG/ACT IN AERS   Inhalation   Inhale 2 puffs into the lungs every 6 (six) hours as needed. For shortness of breath         . CALCIUM CARBONATE-VITAMIN D 500-200 MG-UNIT PO TABS   Oral   Take 1 tablet by mouth daily.         Marland Kitchen VITAMIN D 1000 UNITS PO TABS   Oral   Take 1,000 Units by mouth daily.         Marland Kitchen ESOMEPRAZOLE MAGNESIUM 40 MG PO CPDR   Oral   Take 40 mg by mouth daily before breakfast.         . OMEGA-3 FATTY ACIDS 1000 MG PO CAPS   Oral   Take 2 g by mouth daily.         Marland Kitchen FLUTICASONE PROPIONATE 50 MCG/ACT NA SUSP   Nasal   Place 2 sprays into the nose daily as needed. For  allergies         . FLUTICASONE-SALMETEROL 250-50 MCG/DOSE IN AEPB   Inhalation   Inhale 1 puff into the lungs every 12 (twelve) hours.         . IBANDRONATE SODIUM 150 MG PO TABS   Oral   Take 150 mg by mouth every 30 (thirty) days. Take in the morning with a full glass of water, on an empty stomach, and do not take anything else by mouth or lie down for the next 30 min.         . IBUPROFEN 200 MG PO TABS   Oral   Take 600 mg by mouth every 6 (six) hours as needed. For pain         . LORATADINE-D 12HR PO   Oral   Take 1 tablet by mouth daily.          . ADULT MULTIVITAMIN W/MINERALS CH   Oral   Take 1 tablet by mouth daily.  BP 109/56  Pulse 67  Temp 98.1 F (36.7 C) (Oral)  Resp 18  Ht 5' 10.5" (1.791 m)  Wt 220 lb (99.791 kg)  BMI 31.12 kg/m2  SpO2 97%  Physical Exam  Nursing note and vitals reviewed. Constitutional: She is oriented to person, place, and time. She appears well-developed and well-nourished.  HENT:  Head: Normocephalic and atraumatic.  Eyes: Conjunctivae normal and EOM are normal. Pupils are equal, round, and reactive to light.  Neck: Normal range of motion. Neck supple.  Cardiovascular: Normal rate.   Pulmonary/Chest: Effort normal.  Abdominal: Soft.  Musculoskeletal: She exhibits tenderness. She exhibits no edema.       Left ankle tenderness [place] ankle rules designate x-rayed needed. Positive C & S below injury.  Limited ROM due to pain  Neurological: She is alert and oriented to person, place, and time. She has normal reflexes.  Skin: Skin is warm and dry.  Psychiatric: She has a normal mood and affect.      Spoke with orthopedic PA who is in surgery presently. He would be glad to followup. She will call the office for an appointment today or tomorrow. ED Course  Procedures (including critical care time)  Labs Reviewed - No data to display No results found.   No diagnosis found.    MDM  Distal fibula  fracture on the left. Positive C & S below injury. Patient will followup with Eulah Pont and Wyline Mood. Posterior splint and crutches applied. Prescription for Percocet for pain. Patient understands to return for worsening symptoms.        Remi Haggard, NP 08/24/12 [phone]

## 2012-08-24 NOTE — ED Notes (Signed)
Pt getting undressed form waist down, putting on gown and ice pack ws given to place on ankle; family at bedside

## 2012-08-26 NOTE — ED Provider Notes (Signed)
Medical screening examination/treatment/procedure(s) were performed by non-physician practitioner and as supervising physician I was immediately available for consultation/collaboration.   Jermond Burkemper M Dashawn Bartnick, DO 08/26/12 1310 

## 2013-05-01 ENCOUNTER — Other Ambulatory Visit: Payer: Self-pay | Admitting: Hematology & Oncology

## 2013-07-12 ENCOUNTER — Other Ambulatory Visit: Payer: Self-pay | Admitting: Oral Surgery

## 2013-08-27 ENCOUNTER — Ambulatory Visit
Admission: RE | Admit: 2013-08-27 | Discharge: 2013-08-27 | Disposition: A | Discharge status: Home / Self Care | Payer: Commercial Managed Care - PPO | Source: Ambulatory Visit | Attending: Family | Admitting: Family

## 2013-08-27 ENCOUNTER — Other Ambulatory Visit: Payer: Self-pay | Admitting: Family

## 2013-08-27 DIAGNOSIS — R05 Cough: Secondary | ICD-10-CM

## 2013-10-11 ENCOUNTER — Ambulatory Visit (INDEPENDENT_AMBULATORY_CARE_PROVIDER_SITE_OTHER): Payer: Commercial Managed Care - PPO | Admitting: Internal Medicine

## 2013-10-11 ENCOUNTER — Encounter: Payer: Self-pay | Admitting: Internal Medicine

## 2013-10-11 VITALS — BP 138/82 | HR 86 | Temp 99.2°F | Ht 70.0 in | Wt 227.0 lb

## 2013-10-11 DIAGNOSIS — J45991 Cough variant asthma: Secondary | ICD-10-CM

## 2013-10-11 MED ORDER — PREDNISONE 10 MG PO TABS: ORAL_TABLET | ORAL | Status: DC

## 2013-10-11 MED ORDER — CEFDINIR 300 MG PO CAPS: 300.0000 mg | ORAL_CAPSULE | Freq: Two times a day (BID) | ORAL | Status: DC

## 2013-10-11 MED ORDER — BUDESONIDE-FORMOTEROL FUMARATE 80-4.5 MCG/ACT IN AERO: INHALATION_SPRAY | RESPIRATORY_TRACT | Status: DC

## 2013-10-11 NOTE — Patient Instructions (Addendum)
omnicef 300 mg twice daily x 10 days Prednisone 10 mg take  4 each am x 2 days,   2 each am x 2 days,  1 each am x 2 days and stop  symbicort 80 Take 2 puffs first thing in am and then another 2 puffs about 12 hours later.      Stop advair, fish oil, boniva   For cough mucinex dm up to 1200 mg every 12 hours   nexium 40  Take 30-60 min before first meal of the day and Pepcid ac 20 mg at bedtime   GERD (REFLUX)  is an extremely common cause of respiratory symptoms, many times with no significant heartburn at all.    It can be treated with medication, but also with lifestyle changes including avoidance of late meals, excessive alcohol, smoking cessation, and avoid fatty foods, chocolate, peppermint, colas, red wine, and acidic juices such as orange juice.  NO MINT OR MENTHOL PRODUCTS SO NO COUGH DROPS  USE SUGARLESS CANDY INSTEAD (jolley ranchers or Stover's)  NO OIL BASED VITAMINS - use powdered substitutes.    Please schedule a follow up office visit in 2 weeks, sooner if needed

## 2013-10-11 NOTE — Progress Notes (Signed)
   Subjective:    Patient ID: ELVIS BOOT, female    DOB: 03-Mar-1951   MRN: [id number]  HPI  39 yowf never smoker eczema as toddler then asthma at  age7-8 rx with allergy shots and some kind of resp rx ever since with sev times per year gets "bronchitis" rx abx s prednisone then onsetcough Oct/Nov 2014  with fever/ nasty mucus better with abx but cough lingered and never resolved on advair maint rx referred 10/11/2013 to pulmonary clinic by Eloise Levels [facility] at [facility].  10/11/2013 1st [place] [facility] office visit/ Qiara Minetti cc cough each am x 4 months x tbsp x green mucus thicker in ams.  On nexium and boniva/fish oil.  Albuterol helps some  No obvious patterns in  day to day or daytime variabilty or assoc chronic cough or cp or chest tightness, subjective wheeze overt sinus or hb symptoms. No unusual exp hx or h/o childhood pna/ asthma or knowledge of premature birth.  Sleeping ok without nocturnal  or early am exacerbation  of respiratory  c/o's or need for noct saba. Also denies any obvious fluctuation of symptoms with weather or environmental changes or other aggravating or alleviating factors except as outlined above   Current Medications, Allergies, Complete Past Medical History, Past Surgical History, Family History, and Social History were reviewed in [employer] record.            Review of Systems  Constitutional: Positive for fever. Negative for chills and unexpected weight change.  HENT: Positive for ear pain and postnasal drip. Negative for congestion, dental problem, nosebleeds, rhinorrhea, sinus pressure, sneezing, sore throat, trouble swallowing and voice change.   Eyes: Negative for visual disturbance.  Respiratory: Positive for cough. Negative for choking and shortness of breath.   Cardiovascular: Negative for chest pain and leg swelling.  Gastrointestinal: Negative for vomiting, abdominal pain and diarrhea.  Genitourinary: Negative for  difficulty urinating.  Musculoskeletal: Negative for arthralgias.  Skin: Negative for rash.  Neurological: Negative for tremors, syncope and headaches.  Hematological: Does not bruise/bleed easily.       Objective:   Physical Exam   amb slt hoarse wf nad  Wt Readings from Last 3 Encounters:  10/11/13 227 lb (102.967 kg)  08/24/12 220 lb (99.791 kg)  12/10/11 212 lb 11.9 oz (96.5 kg)     HEENT: nl dentition, turbinates, and orophanx. Nl external ear canals without cough reflex   NECK :  without JVD/Nodes/TM/ nl carotid upstrokes bilaterally   LUNGS: no acc muscle use, clear to A and P bilaterally with  Cough on exp maneuvers   CV:  RRR  no s3 or murmur or increase in P2, no edema   ABD:  soft and nontender with nl excursion in the supine position. No bruits or organomegaly, bowel sounds nl  MS:  warm without deformities, calf tenderness, cyanosis or clubbing  SKIN: warm and dry without lesions    NEURO:  alert, approp, no deficits    cxr 08/27/13 No acute cardiopulmonary disease     Assessment & Plan:

## 2013-10-13 DIAGNOSIS — J45991 Cough variant asthma: Secondary | ICD-10-CM | POA: Insufficient documentation

## 2013-10-13 NOTE — Assessment & Plan Note (Signed)
DDX of  difficult airways managment all start with A and  include Adherence, Ace Inhibitors, Acid Reflux, Active Sinus Disease, Alpha 1 Antitripsin deficiency, Anxiety masquerading as Airways dz,  ABPA,  allergy(esp in young), Aspiration (esp in elderly), Adverse effects of DPI,  Active smokers, plus two Bs  = Bronchiectasis and Beta blocker use..and one C= CHF  Adherence is always the initial "prime suspect" and is a multilayered concern that requires a "trust but verify" approach in every patient - starting with knowing how to use medications, especially inhalers, correctly, keeping up with refills and understanding the fundamental difference between maintenance and prns vs those medications only taken for a very short course and then stopped and not refilled.  The proper method of use, as well as anticipated side effects, of a metered-dose inhaler are discussed and demonstrated to the patient. Improved effectiveness after extensive coaching during this visit to a level of approximately  75% so try symbicort 80 2bid  ?adverse effect of advair > try symbicort hfa  ? Active sinus dz > try omnicef x 10 d  ? Acid (or non-acid) GERD > always difficult to exclude as up to 75% of pts in some series report no assoc GI/ Heartburn symptoms> rec max (24h)  acid suppression and diet restrictions/ reviewed and instructions given in writing. (off boniva and fish oil for now)    Each maintenance medication was reviewed in detail including most importantly the difference between maintenance and as needed and under what circumstances the prns are to be used.  Please see instructions for details which were reviewed in writing and the patient given a copy.

## 2013-10-13 NOTE — Assessment & Plan Note (Signed)
DDX of  difficult airways managment all start with A and  include Adherence, Ace Inhibitors, Acid Reflux, Active Sinus Disease, Alpha 1 Antitripsin deficiency, Anxiety masquerading as Airways dz,  ABPA,  allergy(esp in young), Aspiration (esp in elderly), Adverse effects of DPI,  Active smokers, plus two Bs  = Bronchiectasis and Beta blocker use..and one C= CHF  Adherence is always the initial "prime suspect" and is a multilayered concern that requires a "trust but verify" approach in every patient - starting with knowing how to use medications, especially inhalers, correctly, keeping up with refills and understanding the fundamental difference between maintenance and prns vs those medications only taken for a very short course and then stopped and not refilled.  The proper method of use, as well as anticipated side effects, of a metered-dose inhaler are discussed and demonstrated to the patient. Improved effectiveness after extensive coaching during this visit to a level of approximately  75% so try symbicort 80 2bid  ?adverse effect of advair > try symbicort hfa  ? Active sinus dz > try omnicef x 10 d  ? Acid (or non-acid) GERD > always difficult to exclude as up to 75% of pts in some series report no assoc GI/ Heartburn symptoms> rec max (24h)  acid suppression and diet restrictions/ reviewed and instructions given in writing. (off boniva and fish oil for now)

## 2013-10-25 ENCOUNTER — Other Ambulatory Visit (INDEPENDENT_AMBULATORY_CARE_PROVIDER_SITE_OTHER): Payer: Commercial Managed Care - PPO

## 2013-10-25 ENCOUNTER — Encounter: Payer: Self-pay | Admitting: Internal Medicine

## 2013-10-25 ENCOUNTER — Ambulatory Visit (INDEPENDENT_AMBULATORY_CARE_PROVIDER_SITE_OTHER): Payer: Commercial Managed Care - PPO | Admitting: Internal Medicine

## 2013-10-25 VITALS — BP 116/78 | HR 78 | Temp 98.4°F | Ht 70.0 in | Wt 228.4 lb

## 2013-10-25 DIAGNOSIS — J45991 Cough variant asthma: Secondary | ICD-10-CM

## 2013-10-25 LAB — CBC WITH DIFFERENTIAL/PLATELET
BASOS ABS: 0 10*3/uL (ref 0.0–0.1)
Basophils Relative: 0.3 % (ref 0.0–3.0)
EOS ABS: 0.1 10*3/uL (ref 0.0–0.7)
Eosinophils Relative: 1.4 % (ref 0.0–5.0)
HCT: 41.3 % (ref 36.0–46.0)
HEMOGLOBIN: 13.8 g/dL (ref 12.0–15.0)
LYMPHS PCT: 14.9 % (ref 12.0–46.0)
Lymphs Abs: 1.1 10*3/uL (ref 0.7–4.0)
MCHC: 33.4 g/dL (ref 30.0–36.0)
MCV: 93.9 fl (ref 78.0–100.0)
MONO ABS: 0.4 10*3/uL (ref 0.1–1.0)
Monocytes Relative: 5.4 % (ref 3.0–12.0)
NEUTROS ABS: 5.9 10*3/uL (ref 1.4–7.7)
NEUTROS PCT: 78 % — AB (ref 43.0–77.0)
Platelets: 208 10*3/uL (ref 150.0–400.0)
RBC: 4.4 Mil/uL (ref 3.87–5.11)
RDW: 13.7 % (ref 11.5–14.6)
WBC: 7.6 10*3/uL (ref 4.5–10.5)

## 2013-10-25 MED ORDER — BUDESONIDE-FORMOTEROL FUMARATE 160-4.5 MCG/ACT IN AERO: INHALATION_SPRAY | RESPIRATORY_TRACT | Status: DC

## 2013-10-25 NOTE — Progress Notes (Signed)
Subjective:    Patient ID: Cindy Rogers, female    DOB: [birth year]   MRN: [id number]   Brief patient profile:  55 yowf never smoker eczema as toddler then asthma at  age7-8 rx with allergy shots and some kind of resp rx ever since with sev times per year gets "bronchitis" rx abx s prednisone then onsetcough Oct/Nov 2014  with fever/ nasty mucus better with abx but cough lingered and never resolved on advair maint rx referred 10/11/2013 to pulmonary clinic by Cindy Levels NP at [facility].   History of Present Illness  10/11/2013 1st [facility] office visit/ Cindy Rogers cc cough each am x 4 months x tbsp x green mucus thicker in ams.  On nexium and boniva/fish oil.  Albuterol helps some rec omnicef 300 mg twice daily x 10 days Prednisone 10 mg take  4 each am x 2 days,   2 each am x 2 days,  1 each am x 2 days and stop  symbicort 80 Take 2 puffs first thing in am and then another 2 puffs about 12 hours later.  Stop advair, fish oil, boniva  For cough mucinex dm up to 1200 mg every 12 hours  nexium 40  Take 30-60 min before first meal of the day and Pepcid ac 20 mg at bedtime  GERD diet   10/25/2013 f/u ov/Cindy Rogers re: cough variant asthma Chief Complaint  Patient presents with  . Follow-up    Feeling better-cough not as much-milky green,no metallic taste now,no sob,occass. wheezing,denies cp or tightness       No obvious day to day or daytime variabilty or assoc chronic cough or cp or chest tightness, subjective wheeze overt sinus or hb symptoms. No unusual exp hx or h/o childhood pna/ asthma or knowledge of premature birth.  Sleeping ok without nocturnal  or early am exacerbation  of respiratory  c/o's or need for noct saba. Also denies any obvious fluctuation of symptoms with weather or environmental changes or other aggravating or alleviating factors except as outlined above   Current Medications, Allergies, Complete Past Medical History, Past Surgical History, Family  History, and Social History were reviewed in [employer] record.  ROS  The following are not active complaints unless bolded sore throat, dysphagia, dental problems, itching, sneezing,  nasal congestion or excess/ purulent secretions, ear ache,   fever, chills, sweats, unintended wt loss, pleuritic or exertional cp, hemoptysis,  orthopnea pnd or leg swelling, presyncope, palpitations, heartburn, abdominal pain, anorexia, nausea, vomiting, diarrhea  or change in bowel or urinary habits, change in stools or urine, dysuria,hematuria,  rash, arthralgias, visual complaints, headache, numbness weakness or ataxia or problems with walking or coordination,  change in mood/affect or memory.             Objective:   Physical Exam   amb slt hoarse wf nad 10/25/2013       228 Wt Readings from Last 3 Encounters:  10/11/13 227 lb (102.967 kg)  08/24/12 220 lb (99.791 kg)  12/10/11 212 lb 11.9 oz (96.5 kg)     HEENT: nl dentition, turbinates, and orophanx. Nl external ear canals without cough reflex   NECK :  without JVD/Nodes/TM/ nl carotid upstrokes bilaterally   LUNGS: no acc muscle use, clear to A and P bilaterally with still  Coughs on exp maneuvers   CV:  RRR  no s3 or murmur or increase in P2, no edema   ABD:  soft and nontender with  nl excursion in the supine position. No bruits or organomegaly, bowel sounds nl  MS:  warm without deformities, calf tenderness, cyanosis or clubbing  SKIN: warm and dry without lesions    NEURO:  alert, approp, no deficits    cxr 08/27/13 No acute cardiopulmonary disease     Assessment & Plan:

## 2013-10-25 NOTE — Patient Instructions (Addendum)
Please remember to go to the lab  department downstairs for your tests - we will call you with the results when they are available.  Please see patient coordinator before you leave today  to schedule sinus ct   Increase symbicort 160 Take 2 puffs first thing in am and then another 2 puffs about 12 hours later.   Continue nexium 40 mg before breakfast and pepcid at bedtime  Please schedule a follow up office visit in 2  weeks, sooner if needed

## 2013-10-26 ENCOUNTER — Encounter: Payer: Self-pay | Admitting: Internal Medicine

## 2013-10-26 LAB — ALLERGY PROFILE REGION II-DC, DE, MD, ~~LOC~~, VA
Allergen, D pternoyssinus,d7: 0.22 kU/L — ABNORMAL HIGH
Alternaria Alternata: <0.1 kU/L
Aspergillus fumigatus, m3: <0.1 kU/L
CAT DANDER: 0.2 kU/L — AB
Cladosporium Herbarum: <0.1 kU/L
Cockroach: <0.1 kU/L
D. FARINAE: 0.14 kU/L — AB
Dog Dander: <0.1 kU/L
IgE (Immunoglobulin E), Serum: 3.8 IU/mL (ref 0.0–180.0)
Johnson Grass: <0.1 kU/L
Pecan/Hickory Tree IgE: <0.1 kU/L

## 2013-10-26 NOTE — Assessment & Plan Note (Addendum)
Better but still coughs on exp ? What is causing her problem to be difficult to control  DDX of  difficult airways managment all start with A and  include Adherence, Ace Inhibitors, Acid Reflux, Active Sinus Disease, Alpha 1 Antitripsin deficiency, Anxiety masquerading as Airways dz,  ABPA,  allergy(esp in young), Aspiration (esp in elderly), Adverse effects of DPI,  Active smokers, plus two Bs  = Bronchiectasis and Beta blocker use..and one C= CHF  Adherence is always the initial "prime suspect" and is a multilayered concern that requires a "trust but verify" approach in every patient - starting with knowing how to use medications, especially inhalers, correctly, keeping up with refills and understanding the fundamental difference between maintenance and prns vs those medications only taken for a very short course and then stopped and not refilled.  - The proper method of use, as well as anticipated side effects, of a metered-dose inhaler are discussed and demonstrated to the patient. Improved effectiveness after extensive coaching during this visit to a level of approximately  90% so try symbicort 160 2bid  ? Active sinus dz > check ct  ? Allergies > check profile   ? Acid (or non-acid) GERD > always difficult to exclude as up to 75% of pts in some series report no assoc GI/ Heartburn symptoms> rec cont max (24h)  acid suppression and diet restrictions/ stay off boniva and fish oil for now   See instructions for specific recommendations which were reviewed directly with the patient who was given a copy with highlighter outlining the key components.

## 2013-10-26 NOTE — Progress Notes (Signed)
Quick Note:  LMTCB ______ 

## 2013-10-29 ENCOUNTER — Telehealth: Payer: Self-pay

## 2013-10-29 NOTE — Telephone Encounter (Signed)
Pt aware of test results, advised pt to continue with office recs.  Nothing further needed at this time.

## 2013-10-29 NOTE — Telephone Encounter (Signed)
Message copied by Len Blalock on Mon Oct 29, 2013  2:29 PM ------      Message from: Christinia Gully B      Created: Fri Oct 26, 2013  5:12 PM       Call patient :  Studies are unremarkable, very slt allergies to dust and cat are probably not relevant ------

## 2013-10-29 NOTE — Progress Notes (Signed)
Quick Note:  LMTCB ______ 

## 2013-10-30 ENCOUNTER — Other Ambulatory Visit: Payer: Commercial Managed Care - PPO

## 2013-11-02 ENCOUNTER — Ambulatory Visit (INDEPENDENT_AMBULATORY_CARE_PROVIDER_SITE_OTHER)
Admission: RE | Admit: 2013-11-02 | Discharge: 2013-11-02 | Disposition: A | Discharge status: Home / Self Care | Payer: Commercial Managed Care - PPO | Source: Ambulatory Visit | Attending: Internal Medicine | Admitting: Internal Medicine

## 2013-11-02 DIAGNOSIS — J45991 Cough variant asthma: Secondary | ICD-10-CM

## 2013-11-02 NOTE — Progress Notes (Signed)
Quick Note:  Spoke with pt and notified of results per Dr. Wert. Pt verbalized understanding and denied any questions.  ______ 

## 2013-11-03 ENCOUNTER — Encounter: Payer: Self-pay | Admitting: Internal Medicine

## 2013-11-08 ENCOUNTER — Ambulatory Visit: Payer: Commercial Managed Care - PPO | Admitting: Adult Health

## 2013-11-09 ENCOUNTER — Telehealth: Payer: Self-pay | Admitting: Internal Medicine

## 2013-11-09 NOTE — Telephone Encounter (Signed)
1 sample up front for p/u  Pt aware  Will keep ov next wk

## 2013-11-12 ENCOUNTER — Ambulatory Visit (INDEPENDENT_AMBULATORY_CARE_PROVIDER_SITE_OTHER): Payer: Commercial Managed Care - PPO | Admitting: Adult Health

## 2013-11-12 ENCOUNTER — Encounter: Payer: Self-pay | Admitting: Adult Health

## 2013-11-12 VITALS — BP 124/72 | HR 70 | Temp 98.6°F | Ht 70.0 in | Wt 228.8 lb

## 2013-11-12 DIAGNOSIS — J45991 Cough variant asthma: Secondary | ICD-10-CM

## 2013-11-12 MED ORDER — BUDESONIDE-FORMOTEROL FUMARATE 160-4.5 MCG/ACT IN AERO: INHALATION_SPRAY | RESPIRATORY_TRACT | Status: DC

## 2013-11-12 NOTE — Progress Notes (Signed)
Subjective:    Patient ID: Cindy Rogers, female    DOB: [birth year]   MRN: [id number]   Brief patient profile:  75 yowf never smoker eczema as toddler then asthma at  age7-8 rx with allergy shots and some kind of resp rx ever since with sev times per year gets "bronchitis" rx abx s prednisone then onsetcough Oct/Nov 2014  with fever/ nasty mucus better with abx but cough lingered and never resolved on advair maint rx referred 10/11/2013 to pulmonary clinic by Eloise Levels NP at [facility].   History of Present Illness  10/11/2013 1st [facility] office visit/ Wert cc cough each am x 4 months x tbsp x green mucus thicker in ams.  On nexium and boniva/fish oil.  Albuterol helps some rec omnicef 300 mg twice daily x 10 days Prednisone 10 mg take  4 each am x 2 days,   2 each am x 2 days,  1 each am x 2 days and stop  symbicort 80 Take 2 puffs first thing in am and then another 2 puffs about 12 hours later.  Stop advair, fish oil, boniva  For cough mucinex dm up to 1200 mg every 12 hours  nexium 40  Take 30-60 min before first meal of the day and Pepcid ac 20 mg at bedtime  GERD diet   10/25/2013 f/u ov/Wert re: cough variant asthma Chief Complaint  Patient presents with  . Follow-up    Feeling better-cough not as much-milky green,no metallic taste now,no sob,occass. wheezing,denies cp or tightness   >>CT sinus >nad   11/12/2013 Follow up  Patient returns for a two-week followup for cough variant Asthma asthma. Last visit, patient underwent a CT sinus, which showed no evidence of sinus disease. Her Symbicort was increased to 160 2 puffs twice daily. Patient reports that she feels much improved. Cough has decreased substantially. She feels that her breathing and shortness of breath, is 90% better. She denies any hemoptysis, orthopnea, PND, or leg swelling.      Current Medications, Allergies, Complete Past Medical History, Past Surgical History, Family History, and  Social History were reviewed in [employer] record.  ROS  The following are not active complaints unless bolded sore throat, dysphagia, dental problems, itching, sneezing,  nasal congestion or excess/ purulent secretions, ear ache,   fever, chills, sweats, unintended wt loss, pleuritic or exertional cp, hemoptysis,  orthopnea pnd or leg swelling, presyncope, palpitations, heartburn, abdominal pain, anorexia, nausea, vomiting, diarrhea  or change in bowel or urinary habits, change in stools or urine, dysuria,hematuria,  rash, arthralgias, visual complaints, headache, numbness weakness or ataxia or problems with walking or coordination,  change in mood/affect or memory.             Objective:   Physical Exam   amb slt hoarse wf nad 10/25/2013       228 >228 11/12/2013     HEENT: nl dentition, turbinates, and orophanx. Nl external ear canals without cough reflex   NECK :  without JVD/Nodes/TM/ nl carotid upstrokes bilaterally   LUNGS: no acc muscle use, clear to A and P bilaterally   CV:  RRR  no s3 or murmur or increase in P2, no edema   ABD:  soft and nontender with nl excursion in the supine position. No bruits or organomegaly, bowel sounds nl  MS:  warm without deformities, calf tenderness, cyanosis or clubbing  SKIN: warm and dry without lesions    NEURO:  alert, approp, no deficits    cxr 08/27/13 No acute cardiopulmonary disease     Assessment & Plan:

## 2013-11-12 NOTE — Patient Instructions (Signed)
Symbicort 160 Take 2 puffs first thing in am and then another 2 puffs about 12 hours later. -rinse after use.  Continue nexium 40 mg before breakfast and pepcid at bedtime Please schedule a follow up office visit in 2  Months with Dr. Melvyn Novas  , sooner if needed

## 2013-11-12 NOTE — Assessment & Plan Note (Signed)
Much improved on current regimen  Needs PFT on return   Plan  Symbicort 160 Take 2 puffs first thing in am and then another 2 puffs about 12 hours later. -rinse after use.  Continue nexium 40 mg before breakfast and pepcid at bedtime Please schedule a follow up office visit in 2  Months with Dr. Melvyn Novas  , sooner if needed

## 2014-02-08 ENCOUNTER — Ambulatory Visit (INDEPENDENT_AMBULATORY_CARE_PROVIDER_SITE_OTHER): Payer: Commercial Managed Care - PPO | Admitting: Internal Medicine

## 2014-02-08 ENCOUNTER — Encounter: Payer: Self-pay | Admitting: Internal Medicine

## 2014-02-08 VITALS — BP 102/64 | HR 93 | Temp 99.0°F | Ht 70.0 in | Wt 229.0 lb

## 2014-02-08 DIAGNOSIS — J45991 Cough variant asthma: Secondary | ICD-10-CM

## 2014-02-08 MED ORDER — BUDESONIDE-FORMOTEROL FUMARATE 80-4.5 MCG/ACT IN AERO: INHALATION_SPRAY | RESPIRATORY_TRACT | Status: DC

## 2014-02-08 MED ORDER — PREDNISONE 10 MG PO TABS: ORAL_TABLET | ORAL | Status: DC

## 2014-02-08 MED ORDER — BUDESONIDE-FORMOTEROL FUMARATE 80-4.5 MCG/ACT IN AERO
INHALATION_SPRAY | RESPIRATORY_TRACT | Status: DC
Start: 2014-02-08 — End: 2014-02-08

## 2014-02-08 MED ORDER — MONTELUKAST SODIUM 10 MG PO TABS: ORAL_TABLET | ORAL | Status: DC

## 2014-02-08 NOTE — Progress Notes (Signed)
Subjective:    Patient ID: Cindy Rogers, female    DOB: 22-Nov-1950   MRN: [id number]   Brief patient profile:  63 yowf never smoker eczema as toddler then asthma at  Age 63 rx with allergy shots and some kind of resp rx ever since with sev times per year gets "bronchitis" rx abx s prednisone then onset cough Oct/Nov 2014  with fever/ nasty mucus better with abx but cough lingered and never resolved on advair maint rx referred 10/11/2013 to pulmonary clinic by Cindy Levels NP at [facility] with Upper airway cough syndrome vs UACS   History of Present Illness  10/11/2013 1st [facility] office visit/ Cindy Rogers cc cough each am x 4 months x tbsp x green mucus thicker in ams.  On nexium and boniva/fish oil.  Albuterol helps some rec omnicef 300 mg twice daily x 10 days Prednisone 10 mg take  4 each am x 2 days,   2 each am x 2 days,  1 each am x 2 days and stop  symbicort 80 Take 2 puffs first thing in am and then another 2 puffs about 12 hours later.  Stop advair, fish oil, boniva  For cough mucinex dm up to 1200 mg every 12 hours  nexium 40  Take 30-60 min before first meal of the day and Pepcid ac 20 mg at bedtime  GERD diet   10/25/2013 f/u ov/Cindy Rogers re: cough variant asthma Chief Complaint  Patient presents with  . Follow-up    Feeling better-cough not as much-milky green,no metallic taste now,no sob,occass. wheezing,denies cp or tightness   >>CT sinus = nad  rec .  Increase symbicort 160 Take 2 puffs first thing in am and then another 2 puffs about 12 hours later.  Continue nexium 40 mg before breakfast and pepcid at bedtime and stay off boniva for now    11/12/2013 Follow up  Patient returns for a two-week followup for cough variant Asthma asthma. Last visit, patient underwent a CT sinus, which showed no evidence of sinus disease. Her Symbicort was increased to 160 2 puffs twice daily. Patient reports that she feels much improved. Cough has decreased substantially. She  feels that her breathing and shortness of breath, is 90% better. rec  no change rx   02/08/2014 f/u ov/Cindy Rogers re: chronic cough x 6 m/ longstanding ? Asthma with neg allergy profile Chief Complaint  Patient presents with  . Follow-up    Pt states cough is unchanged. Still "coughing up junk every day"- minimal yellow to white sputum.   mostly in am's doesn't wake her up prematurely, ? With swallowing made worse On symbicort 160 2 bid and gerd rx Only med that works really well is prednisone, never tried singulair     No obvious day to day or daytime variabilty or assoc sob or cp or chest tightness, subjective wheeze overt sinus or hb symptoms. No unusual exp hx or h/o childhood pna/ asthma or knowledge of premature birth.  Sleeping ok without nocturnal  or early am exacerbation  of respiratory  c/o's or need for noct saba. Also denies any obvious fluctuation of symptoms with weather or environmental changes or other aggravating or alleviating factors except as outlined above   Current Medications, Allergies, Complete Past Medical History, Past Surgical History, Family History, and Social History were reviewed in [employer] record.  ROS  The following are not active complaints unless bolded sore throat, dysphagia, dental problems, itching, sneezing,  nasal congestion or excess/ purulent secretions, ear ache,   fever, chills, sweats, unintended wt loss, pleuritic or exertional cp, hemoptysis,  orthopnea pnd or leg swelling, presyncope, palpitations, heartburn, abdominal pain, anorexia, nausea, vomiting, diarrhea  or change in bowel or urinary habits, change in stools or urine, dysuria,hematuria,  rash, arthralgias, visual complaints, headache, numbness weakness or ataxia or problems with walking or coordination,  change in mood/affect or memory.                 Objective:   Physical Exam   amb slt hoarse wf nad  10/25/2013       228 >228 11/12/2013 > 02/08/2014 229      HEENT: nl dentition, turbinates, and orophanx. Nl external ear canals without cough reflex   NECK :  without JVD/Nodes/TM/ nl carotid upstrokes bilaterally   LUNGS: prominent pseudowheeze, resolves completely with PLM   CV:  RRR  no s3 or murmur or increase in P2, no edema   ABD:  soft and nontender with nl excursion in the supine position. No bruits or organomegaly, bowel sounds nl  MS:  warm without deformities, calf tenderness, cyanosis or clubbing  SKIN: warm and dry without lesions    NEURO:  alert, approp, no deficits    cxr 08/27/13 No acute cardiopulmonary disease     Assessment & Plan:

## 2014-02-08 NOTE — Patient Instructions (Addendum)
Try symbicort 80 Take 2 puffs first thing in am and then another 2 puffs about 12 hours later.   Add singulair 10 mg each pm   Prednisone 10 mg take  4 each am x 2 days,   2 each am x 2 days,  1 each am x 2 days and stop  Please schedule a follow up visit in 3 months but call sooner if needed

## 2014-02-08 NOTE — Assessment & Plan Note (Signed)
symbicort 80 2bid started 10/12/13  - increased symbicort to 160 2bid 10/26/2013 - Sinus CT 11/02/13 > No evidence of acute or chronic sinusitis. Negative study. - Allergy profile 10/25/13 > IgE  3.8 with min elevations of cat/ dust   Discussed in detail all the  indications, usual  risks and alternatives  relative to the benefits with patient who agrees to proceed with trial of lower dose symbicort (as the higher dose may be contributing to pseudowheeze) and trial of singulair   The proper method of use, as well as anticipated side effects, of a metered-dose inhaler are discussed and demonstrated to the patient. Improved effectiveness after extensive coaching during this visit to a level of approximately  90%     Each maintenance medication was reviewed in detail including most importantly the difference between maintenance and as needed and under what circumstances the prns are to be used.  Please see instructions for details which were reviewed in writing and the patient given a copy.

## 2014-04-17 ENCOUNTER — Other Ambulatory Visit: Payer: Self-pay | Admitting: Family

## 2014-04-17 DIAGNOSIS — M8580 Other specified disorders of bone density and structure, unspecified site: Secondary | ICD-10-CM

## 2014-05-09 ENCOUNTER — Other Ambulatory Visit: Payer: Self-pay | Admitting: Internal Medicine

## 2014-05-09 ENCOUNTER — Ambulatory Visit: Payer: Commercial Managed Care - PPO | Admitting: Internal Medicine

## 2014-05-09 DIAGNOSIS — J45991 Cough variant asthma: Secondary | ICD-10-CM

## 2014-05-10 ENCOUNTER — Ambulatory Visit (INDEPENDENT_AMBULATORY_CARE_PROVIDER_SITE_OTHER): Payer: Commercial Managed Care - PPO | Admitting: Internal Medicine

## 2014-05-10 ENCOUNTER — Encounter: Payer: Self-pay | Admitting: Internal Medicine

## 2014-05-10 VITALS — BP 116/70 | HR 79 | Temp 99.1°F | Ht 70.0 in | Wt 227.0 lb

## 2014-05-10 DIAGNOSIS — J45991 Cough variant asthma: Secondary | ICD-10-CM

## 2014-05-10 DIAGNOSIS — M810 Age-related osteoporosis without current pathological fracture: Secondary | ICD-10-CM

## 2014-05-10 LAB — PULMONARY FUNCTION TEST
DL/VA % PRED: 94 %
DL/VA: 5.15 ml/min/mmHg/L
DLCO UNC % PRED: 88 %
DLCO UNC: 28.63 ml/min/mmHg
FEF 25-75 Post: 1.84 L/sec
FEF 25-75 Pre: 1.74 L/sec
FEF2575-%Change-Post: 5 %
FEF2575-%Pred-Post: 71 %
FEF2575-%Pred-Pre: 67 %
FEV1-%CHANGE-POST: 0 %
FEV1-%PRED-POST: 90 %
FEV1-%PRED-PRE: 89 %
FEV1-PRE: 2.74 L
FEV1-Post: 2.76 L
FEV1FVC-%CHANGE-POST: 0 %
FEV1FVC-%PRED-PRE: 89 %
FEV6-%Change-Post: 0 %
FEV6-%PRED-POST: 101 %
FEV6-%Pred-Pre: 101 %
FEV6-PRE: 3.9 L
FEV6-Post: 3.89 L
FEV6FVC-%Change-Post: 0 %
FEV6FVC-%PRED-PRE: 102 %
FEV6FVC-%Pred-Post: 102 %
FVC-%Change-Post: 0 %
FVC-%Pred-Post: 99 %
FVC-%Pred-Pre: 99 %
FVC-POST: 3.97 L
FVC-Pre: 3.95 L
POST FEV1/FVC RATIO: 70 %
POST FEV6/FVC RATIO: 98 %
PRE FEV6/FVC RATIO: 99 %
Pre FEV1/FVC ratio: 69 %
RV % pred: 87 %
RV: 2.05 L
TLC % pred: 99 %
TLC: 5.93 L

## 2014-05-10 MED ORDER — MONTELUKAST SODIUM 10 MG PO TABS: ORAL_TABLET | ORAL | Status: DC

## 2014-05-10 NOTE — Progress Notes (Addendum)
Subjective:    Patient ID: Cindy Rogers, female    DOB: [birth year]   MRN: [id number]   Brief patient profile:  63 yowf never smoker eczema as toddler then asthma at  Age [age] rx with allergy shots and some kind of resp rx ever since with sev times per year gets "bronchitis" rx abx s prednisone then onset cough Oct/Nov 2014  with fever/ nasty mucus better with abx but cough lingered and never resolved on advair maint rx referred 10/11/2013 to pulmonary clinic by Eloise Levels NP at [facility] with Upper airway cough syndrome vs UACS   History of Present Illness  10/11/2013 1st [facility] office visit/ Cindy Rogers cc cough each am x 4 months x tbsp x green mucus thicker in ams.  On nexium and boniva/fish oil.  Albuterol helps some rec omnicef 300 mg twice daily x 10 days Prednisone 10 mg take  4 each am x 2 days,   2 each am x 2 days,  1 each am x 2 days and stop  symbicort 80 Take 2 puffs first thing in am and then another 2 puffs about 12 hours later.  Stop advair, fish oil, boniva  For cough mucinex dm up to 1200 mg every 12 hours  nexium 40  Take 30-60 min before first meal of the day and Pepcid ac 20 mg at bedtime  GERD diet   10/25/2013 f/u ov/Cindy Rogers re: cough variant asthma Chief Complaint  Patient presents with  . Follow-up    Feeling better-cough not as much-milky green,no metallic taste now,no sob,occass. wheezing,denies cp or tightness   >>CT sinus = nad  rec .  Increase symbicort 160 Take 2 puffs first thing in am and then another 2 puffs about 12 hours later.  Continue nexium 40 mg before breakfast and pepcid at bedtime and stay off boniva for now        02/08/2014 f/u ov/Cindy Rogers re: chronic cough x 6 m/ longstanding ? Asthma with neg allergy profile Chief Complaint  Patient presents with  . Follow-up    Pt states cough is unchanged. Still "coughing up junk every day"- minimal yellow to white sputum.   mostly in am's doesn't wake her up prematurely, ? With  swallowing made worse On symbicort 160 2 bid and gerd rx Only med that works really well is prednisone, never tried singulair  rec Try symbicort 80 Take 2 puffs first thing in am and then another 2 puffs about 12 hours later.  Add singulair 10 mg each pm  Prednisone 10 mg take  4 each am x 2 days,   2 each am x 2 days,  1 each am x 2 days and stop   05/10/2014 f/u ov/Cindy Rogers re: ? Cough variant asthma vs asthma Chief Complaint  Patient presents with  . Follow-up    Pt states that her cough is much improved. She is using albuterol inhaler approx once per month.   still some problem walking in heat and humidity but overall very satisfied and change def occurred after starting singulair and lowering dose of symbicort to 80 2bid  No obvious day to day or daytime variabilty or assoc excess/ purulent mucus or cp or chest tightness, subjective wheeze overt sinus or hb symptoms. No unusual exp hx or h/o childhood pna/ asthma or knowledge of premature birth.  Sleeping ok without nocturnal  or early am exacerbation  of respiratory  c/o's or need for noct saba. Also denies any obvious fluctuation  of symptoms with weather or environmental changes or other aggravating or alleviating factors except as outlined above   Current Medications, Allergies, Complete Past Medical History, Past Surgical History, Family History, and Social History were reviewed in [employer] record.  ROS  The following are not active complaints unless bolded sore throat, dysphagia, dental problems, itching, sneezing,  nasal congestion or excess/ purulent secretions, ear ache,   fever, chills, sweats, unintended wt loss, pleuritic or exertional cp, hemoptysis,  orthopnea pnd or leg swelling, presyncope, palpitations, heartburn, abdominal pain, anorexia, nausea, vomiting, diarrhea  or change in bowel or urinary habits, change in stools or urine, dysuria,hematuria,  rash, arthralgias, visual complaints, headache,  numbness weakness or ataxia or problems with walking or coordination,  change in mood/affect or memory.                 Objective:   Physical Exam   amb min  hoarse wf nad  10/25/2013  228 >228 11/12/2013 > 02/08/2014 229 > 05/10/2014  227     HEENT: nl dentition, turbinates, and orophanx. Nl external ear canals without cough reflex   NECK :  without JVD/Nodes/TM/ nl carotid upstrokes bilaterally   LUNGS: prominent pseudowheeze, resolves completely with PLM   CV:  RRR  no s3 or murmur or increase in P2, no edema   ABD:  soft and nontender with nl excursion in the supine position. No bruits or organomegaly, bowel sounds nl  MS:  warm without deformities, calf tenderness, cyanosis or clubbing  SKIN: warm and dry without lesions    NEURO:  alert, approp, no deficits    cxr 08/27/13 No acute cardiopulmonary disease     Assessment & Plan:   Outpatient Encounter Prescriptions as of 05/10/2014  Medication Sig  . albuterol (PROVENTIL HFA;VENTOLIN HFA) 108 (90 BASE) MCG/ACT inhaler Inhale 2 puffs into the lungs every 6 (six) hours as needed. For shortness of breath  . budesonide-formoterol (SYMBICORT) 80-4.5 MCG/ACT inhaler Take 2 puffs first thing in am and then another 2 puffs about 12 hours later.  . calcium-vitamin D (OSCAL WITH D) 500-200 MG-UNIT per tablet Take 1 tablet by mouth daily.  . cholecalciferol (VITAMIN D) 1000 UNITS tablet Take 1,000 Units by mouth daily.  . ergocalciferol (VITAMIN D2) 50000 UNITS capsule Take 50,000 Units by mouth once a week.  . esomeprazole (NEXIUM) 20 MG capsule Take 2 daily before breakfast  . famotidine (PEPCID) 20 MG tablet Take by mouth one at bedtime  . fluticasone (FLONASE) 50 MCG/ACT nasal spray Place 2 sprays into the nose daily as needed. For allergies  . guaiFENesin (MUCINEX) 600 MG 12 hr tablet Take 600 mg by mouth 2 (two) times daily as needed.  Marland Kitchen ibuprofen (ADVIL,MOTRIN) 200 MG tablet Take 600 mg by mouth every 6 (six) hours as  needed. For pain  . montelukast (SINGULAIR) 10 MG tablet One at bedtime every night  . Multiple Vitamin (MULITIVITAMIN WITH MINERALS) TABS Take 1 tablet by mouth daily.  . [DISCONTINUED] montelukast (SINGULAIR) 10 MG tablet One at bedtime every night  . [DISCONTINUED] predniSONE (DELTASONE) 10 MG tablet Take  4 each am x 2 days,   2 each am x 2 days,  1 each am x 2 days and stop

## 2014-05-10 NOTE — Patient Instructions (Signed)
Alternative to boniva is reclast  Ok to try off symbiocort to see what difference this makes and if worse at all restart the 2 every 12 hours dose  We can see you for refills but if you're doing great it's ok to let you primary doctor do this and let your friends know, too!!!

## 2014-05-10 NOTE — Progress Notes (Signed)
PFT done today. 

## 2014-05-10 NOTE — Assessment & Plan Note (Signed)
Trial off boniva 10/11/13 due to cough > ok to rechallenge now that cough is well controlled but low threshold to change to IV reclast if cough recurs on it

## 2014-05-10 NOTE — Assessment & Plan Note (Signed)
symbicort 80 2bid started 10/12/13  - increased symbicort to 160 2bid 10/26/2013 - Sinus CT 11/02/13 > No evidence of acute or chronic sinusitis. Negative study. - Allergy profile 10/25/13 > IgE  3.8 with min elevations of cat/ dust  - Symbicort 80 plus singulair 02/08/2014  - 02/08/2014 p extensive coaching HFA effectiveness =    90% - PFT's 05/10/2014 wnl except for fef 25-75 > ok to try off maint symbicort and just restart prn     Each maintenance medication was reviewed in detail including most importantly the difference between maintenance and as needed and under what circumstances the prns are to be used.  Please see instructions for details which were reviewed in writing and the patient given a copy.

## 2015-01-17 ENCOUNTER — Ambulatory Visit (INDEPENDENT_AMBULATORY_CARE_PROVIDER_SITE_OTHER): Payer: BLUE CROSS/BLUE SHIELD | Admitting: Internal Medicine

## 2015-01-17 ENCOUNTER — Encounter (INDEPENDENT_AMBULATORY_CARE_PROVIDER_SITE_OTHER): Payer: Self-pay

## 2015-01-17 ENCOUNTER — Encounter: Payer: Self-pay | Admitting: Internal Medicine

## 2015-01-17 VITALS — BP 122/74 | HR 80 | Ht 70.0 in | Wt 231.8 lb

## 2015-01-17 DIAGNOSIS — J45991 Cough variant asthma: Secondary | ICD-10-CM

## 2015-01-17 MED ORDER — BECLOMETHASONE DIPROPIONATE 80 MCG/ACT IN AERS: INHALATION_SPRAY | RESPIRATORY_TRACT | Status: DC

## 2015-01-17 MED ORDER — MONTELUKAST SODIUM 10 MG PO TABS: ORAL_TABLET | ORAL | Status: DC

## 2015-01-17 MED ORDER — ALBUTEROL SULFATE HFA 108 (90 BASE) MCG/ACT IN AERS: INHALATION_SPRAY | RESPIRATORY_TRACT | Status: DC

## 2015-01-17 NOTE — Patient Instructions (Addendum)
Qvar 80 Take 2 puffs first thing in am and then another 2 puffs about 12 hours later consistently   Ok to leave off flonase if you don't feel it's helping  Only use your albuterol (proair)  as a rescue medication to be used if you can't catch your breath by resting or doing a relaxed purse lip breathing pattern.  - The less you use it, the better it will work when you need it. - Ok to use up to 2 puffs  every 4 hours if you must but call for immediate appointment if use goes up over your usual need - Don't leave home without it !!  (think of it like the spare tire for your car)   Please schedule a follow up visit in 3 months but call sooner if needed

## 2015-01-17 NOTE — Progress Notes (Signed)
Subjective:    Patient ID: Cindy Rogers, female    DOB: [birth year]   MRN: [id number]   Brief patient profile:  64 yowf never smoker eczema as toddler then asthma at  Age [age] rx with allergy shots and some kind of resp rx ever since with sev times per year gets "bronchitis" rx abx s prednisone then onset cough Oct/Nov 2014  with fever/ nasty mucus better with abx but cough lingered and never resolved on advair maint rx referred 10/11/2013 to pulmonary clinic by Eloise Levels NP at [facility] with Upper airway cough syndrome vs UACS   History of Present Illness  64/29/2015 1st  Pulmonary office visit/ Falisha Osment cc cough each am x 4 months x tbsp x green mucus thicker in ams.  On nexium and boniva/fish oil.  Albuterol helps some rec omnicef 300 mg twice daily x 10 days Prednisone 10 mg take  4 each am x 2 days,   2 each am x 2 days,  1 each am x 2 days and stop  symbicort 80 Take 2 puffs first thing in am and then another 2 puffs about 12 hours later.  Stop advair, fish oil, boniva  For cough mucinex dm up to 1200 mg every 12 hours  nexium 40  Take 30-60 min before first meal of the day and Pepcid ac 20 mg at bedtime  GERD diet   10/25/2013 f/u ov/[name] re: cough variant asthma Chief Complaint  Patient presents with  . Follow-up    Feeling better-cough not as much-milky green,no metallic taste now,no sob,occass. wheezing,denies cp or tightness   >>CT sinus = nad  rec .  Increase symbicort 160 Take 2 puffs first thing in am and then another 2 puffs about 12 hours later.  Continue nexium 40 mg before breakfast and pepcid at bedtime and stay off boniva for now        02/08/2014 f/u ov/[name] re: chronic cough x 6 m/ longstanding ? Asthma with neg allergy profile Chief Complaint  Patient presents with  . Follow-up    Pt states cough is unchanged. Still "coughing up junk every day"- minimal yellow to white sputum.   mostly in am's doesn't wake her up prematurely, ? With  swallowing made worse On symbicort 160 2 bid and gerd rx Only med that works really well is prednisone, never tried singulair  rec Try symbicort 80 Take 2 puffs first thing in am and then another 2 puffs about 12 hours later.  Add singulair 10 mg each pm  Prednisone 10 mg take  4 each am x 2 days,   2 each am x 2 days,  1 each am x 2 days and stop   05/10/2014 f/u ov/[name] re: ? Cough variant asthma vs asthma Chief Complaint  Patient presents with  . Follow-up    Pt states that her cough is much improved. She is using albuterol inhaler approx once per month.   still some problem walking in heat and humidity but overall very satisfied and change def occurred after starting singulair and lowering dose of symbicort to 80 2bid rec alternative to boniva is reclast Ok to try off symbiocort to see what difference this makes and if worse at all restart the 2 every 12 hours dose   01/17/2015 f/u ov/[name] [id number] variant asthma / recurred off symbicort 80 Chief Complaint  Patient presents with  . Follow-up    Pt states her breathing is doing well today. She had cough  a few wks ago, and had to start back on symbicort and this has helped.    Only able to stop the symbicort for a few weeks before symptoms recurred  No obvious day to day or daytime variabilty or assoc excess/ purulent mucus or cp or chest tightness, subjective wheeze overt sinus or hb symptoms. No unusual exp hx or h/o childhood pna/ asthma or knowledge of premature birth.  Sleeping ok without nocturnal  or early am exacerbation  of respiratory  c/o's or need for noct saba. Also denies any obvious fluctuation of symptoms with weather or environmental changes or other aggravating or alleviating factors except as outlined above   Current Medications, Allergies, Complete Past Medical History, Past Surgical History, Family History, and Social History were reviewed in [employer] record.  ROS  The following are not  active complaints unless bolded sore throat, dysphagia, dental problems, itching, sneezing,  nasal congestion or excess/ purulent secretions, ear ache,   fever, chills, sweats, unintended wt loss, pleuritic or exertional cp, hemoptysis,  orthopnea pnd or leg swelling, presyncope, palpitations, heartburn, abdominal pain, anorexia, nausea, vomiting, diarrhea  or change in bowel or urinary habits, change in stools or urine, dysuria,hematuria,  rash, arthralgias, visual complaints, headache, numbness weakness or ataxia or problems with walking or coordination,  change in mood/affect or memory.                 Objective:   Physical Exam   amb wf nad    10/25/2013  228 >228 11/12/2013 > 02/08/2014 229 > 05/10/2014  227 >  01/17/2015  231     HEENT: nl dentition, turbinates, and orophanx. Nl external ear canals without cough reflex   NECK :  without JVD/Nodes/TM/ nl carotid upstrokes bilaterally   LUNGS: prominent pseudowheeze, resolves completely with PLM   CV:  RRR  no s3 or murmur or increase in P2, no edema   ABD:  soft and nontender with nl excursion in the supine position. No bruits or organomegaly, bowel sounds nl  MS:  warm without deformities, calf tenderness, cyanosis or clubbing  SKIN: warm and dry without lesions    NEURO:  alert, approp, no deficits    cxr 08/27/13 No acute cardiopulmonary disease     Assessment & Plan:

## 2015-01-18 NOTE — Assessment & Plan Note (Signed)
symbicort 80 2bid started 10/12/13  - increased symbicort to 160 2bid 10/26/2013 - Sinus CT 11/02/13 > No evidence of acute or chronic sinusitis. Negative study. - Allergy profile 10/25/13 > IgE  3.8 with min elevations of cat/ dust  - Symbicort 80 plus singulair 02/08/2014  - 02/08/2014 p extensive coaching HFA effectiveness =    90% - PFT's 05/10/2014 wnl except for fef 25-75 > ok to try off maint symbicort and just restart prn> could not maint off symb even on singulair - 01/17/2015 p extensive coaching HFA effectiveness =    95% > - Trial of qvar 80 2bid  If doing great at 3 m consider step down to qvar 40 bid  Each maintenance medication was reviewed in detail including most importantly the difference between maintenance and as needed and under what circumstances the prns are to be used.  Please see instructions for details which were reviewed in writing and the patient given a copy.

## 2015-01-22 ENCOUNTER — Telehealth: Payer: Self-pay | Admitting: *Deleted

## 2015-01-22 NOTE — Telephone Encounter (Signed)
PA received from [facility] PA submitted via covermymeds Key: [id number]

## 2015-01-23 NOTE — Telephone Encounter (Signed)
PA for Qvar has been denied. Fax should be coming today with covered alternatives

## 2015-02-04 MED ORDER — MOMETASONE FUROATE 100 MCG/ACT IN AERO: 2.0000 | INHALATION_SPRAY | Freq: Two times a day (BID) | RESPIRATORY_TRACT | Status: DC

## 2015-02-04 NOTE — Telephone Encounter (Signed)
asthmanex hfa 100 2bid ok substitue

## 2015-02-04 NOTE — Telephone Encounter (Signed)
Qvar denied  Ins prefers asmanex or flovent  Pt not due back for 3 months  Please advise thanks

## 2015-02-04 NOTE — Telephone Encounter (Signed)
Patient must try and fail Asmanex and Flovent 1st. Per pts ins.

## 2015-02-04 NOTE — Telephone Encounter (Signed)
Sonya, if you have a chance can you check on this one? I never received any alternatives Thanks!

## 2015-02-04 NOTE — Telephone Encounter (Signed)
Spoke with the pt and notified of recs per MW  She verbalized understanding  1 sample of asmanex 100 up front for pick up  She will call for rx if it helps

## 2015-03-12 ENCOUNTER — Telehealth: Payer: Self-pay | Admitting: Internal Medicine

## 2015-03-12 MED ORDER — MOMETASONE FUROATE 100 MCG/ACT IN AERO: 2.0000 | INHALATION_SPRAY | Freq: Two times a day (BID) | RESPIRATORY_TRACT | Status: DC

## 2015-03-12 NOTE — Telephone Encounter (Signed)
Spoke with pt. She needs her asmanex RX sent to [facility]. I have done so. Nothing further needed

## 2015-03-24 ENCOUNTER — Telehealth: Payer: Self-pay | Admitting: Internal Medicine

## 2015-03-24 MED ORDER — AZITHROMYCIN 250 MG PO TABS: ORAL_TABLET | ORAL | Status: AC

## 2015-03-24 MED ORDER — PREDNISONE 10 MG PO TABS
ORAL_TABLET | ORAL | Status: DC
Start: 2015-03-24 — End: 2015-04-03

## 2015-03-24 NOTE — Telephone Encounter (Signed)
Spoke with pt. She would prefer to have the medication sent in. Rx's have been sent in. Nothing further was needed.

## 2015-03-24 NOTE — Telephone Encounter (Signed)
Spoke with pt. C/o prod cough (thick green phlem), chest cong, low grade temp in evenings around 99.8, chest tx, wheezing. She is taking mucinex BID. Wants to know if something can be called in. Please advise thanks

## 2015-03-24 NOTE — Telephone Encounter (Signed)
Strongly prefer w/in with Tammy or me but if declines this then zpak and Prednisone 10 mg take  4 each am x 2 days,   2 each am x 2 days,  1 each am x 2 days and stop but be sure has Maryla Morrow f/u w/in next 6 weeks

## 2015-04-02 ENCOUNTER — Telehealth: Payer: Self-pay | Admitting: Internal Medicine

## 2015-04-02 NOTE — Telephone Encounter (Signed)
Called pt and appt scheduled to see MW tomorrow AM at 9 am. Nothing further needed

## 2015-04-03 ENCOUNTER — Ambulatory Visit (INDEPENDENT_AMBULATORY_CARE_PROVIDER_SITE_OTHER): Payer: BLUE CROSS/BLUE SHIELD | Admitting: Internal Medicine

## 2015-04-03 ENCOUNTER — Encounter: Payer: Self-pay | Admitting: Internal Medicine

## 2015-04-03 ENCOUNTER — Ambulatory Visit (INDEPENDENT_AMBULATORY_CARE_PROVIDER_SITE_OTHER)
Admission: RE | Admit: 2015-04-03 | Discharge: 2015-04-03 | Disposition: A | Discharge status: Home / Self Care | Payer: BLUE CROSS/BLUE SHIELD | Source: Ambulatory Visit | Attending: Internal Medicine | Admitting: Internal Medicine

## 2015-04-03 VITALS — BP 122/80 | HR 78 | Temp 98.4°F | Ht 70.0 in | Wt 230.8 lb

## 2015-04-03 DIAGNOSIS — J45991 Cough variant asthma: Secondary | ICD-10-CM | POA: Diagnosis not present

## 2015-04-03 DIAGNOSIS — E669 Obesity, unspecified: Secondary | ICD-10-CM

## 2015-04-03 MED ORDER — CEFDINIR 300 MG PO CAPS: 300.0000 mg | ORAL_CAPSULE | Freq: Two times a day (BID) | ORAL | Status: DC

## 2015-04-03 NOTE — Patient Instructions (Addendum)
Stop asthmanex and start symbicort 160 Take 2 puffs first thing in am and then another 2 puffs about 12 hours later then when finished go back to the asthmanex   Only use your albuterol as a rescue medication to be used if you can't catch your breath or feel  Tight or wheezy   - The less you use it, the better it will work when you need it. - Ok to use up to 2 puffs  every 4 hours if you must but call for immediate appointment if use goes up over your usual need - Don't leave home without it !!  (think of it like the spare tire for your car)   omnicef twice daily x 10 days   Please remember to go to the  x-ray department downstairs for your tests - we will call you with the results when they are available.  If not happy with asthmanex > return with your formulary list of alternatives

## 2015-04-03 NOTE — Progress Notes (Signed)
Quick Note:  Spoke with pt and notified of results per Dr. Wert. Pt verbalized understanding and denied any questions.  ______ 

## 2015-04-03 NOTE — Progress Notes (Signed)
Subjective:    Patient ID: Cindy Rogers, female    DOB: 27-Jun-1951   MRN: [id number]   Brief patient profile:  64 yowf never smoker eczemaema as toddler then asthma at  Age [age] rx with allergy shots and some kind of resp rx ever since with sev times per year gets "bronchitis" rx abx s prednisone then onset cough Oct/Nov 2014  with fever/ nasty mucus better with abx but cough lingered and never resolved on advair maint rx referred 10/11/2013 to pulmonary clinic by Eloise Levels NP at [facility] with Upper airway cough syndrome vs UACS   History of Present Illness  10/11/2013 1st [facility] [facility] office visit/ Wert cc cough each am x 4 months x tbsp x green mucus thicker in ams.  On nexium and boniva/fish oil.  Albuterol helps some rec omnicef 300 mg twice daily x 10 days Prednisone 10 mg take  4 each am x 2 days,   2 each am x 2 days,  1 each am x 2 days and stop  symbicort 80 Take 2 puffs first thing in am and then another 2 puffs about 12 hours later.  Stop advair, fish oil, boniva  For cough mucinex dm up to 1200 mg every 12 hours  nexium 40  Take 30-60 min before first meal of the day and Pepcid ac 20 mg at bedtime  GERD diet   10/25/2013 f/u ov/Wert re: cough variant asthma Chief Complaint  Patient presents with  . Follow-up    Feeling better-cough not as much-milky green,no metallic taste now,no sob,occass. wheezing,denies cp or tightness   >>CT sinus = nad  rec .  Increase symbicort 160 Take 2 puffs first thing in am and then another 2 puffs about 12 hours later.  Continue nexium 40 mg before breakfast and pepcid at bedtime and stay off boniva for now        02/08/2014 f/u ov/Wert re: chronic cough x 6 m/ longstanding ? Asthma with neg allergy profile Chief Complaint  Patient presents with  . Follow-up    Pt states cough is unchanged. Still "coughing up junk every day"- minimal yellow to white sputum.   mostly in am's doesn't wake her up prematurely, ? With  swallowing made worse On symbicort 160 2 bid and gerd rx Only med that works really well is prednisone, never tried singulair  rec Try symbicort 80 Take 2 puffs first thing in am and then another 2 puffs about 12 hours later.  Add singulair 10 mg each pm  Prednisone 10 mg take  4 each am x 2 days,   2 each am x 2 days,  1 each am x 2 days and stop   05/10/2014 f/u ov/Wert re: ? Cough variant asthma vs asthma Chief Complaint  Patient presents with  . Follow-up    Pt states that her cough is much improved. She is using albuterol inhaler approx once per month.   still some problem walking in heat and humidity but overall very satisfied and change def occurred after starting singulair and lowering dose of symbicort to 80 2bid rec alternative to boniva is reclast Ok to try off symbiocort to see what difference this makes and if worse at all restart the 2 every 12 hours dose   01/17/2015 f/u ov/Wert [id number] variant asthma / recurred off symbicort 80 Chief Complaint  Patient presents with  . Follow-up    Pt states her breathing is doing well today. She had cough  a few wks ago, and had to start back on symbicort and this has helped.   Only able to stop the symbicort for a few weeks before symptoms recurred rec Qvar 80 Take 2 puffs first thing in am and then another 2 puffs about 12 hours later consistently  Ok to leave off flonase if you don't feel it's helping Only use your albuterol (proair) as rescue    04/03/2015  Acute  ov/Wert re: acute flare of cough while on asthmanex/ singulair maint rx  Chief Complaint  Patient presents with  . Acute Visit    Pt c/o increased cough- dark sputum, fever and fatigue for the past 2 wks.    Was doing great on asthmanex 100 Take 2 puffs first thing in am and then another 2 puffs about 12 hours later.  Abruptly sick x 2weeks after over heated then went into overcooled environnent in  a/c "chest cold"  Zpak/pred rx 03/24/15 > some better gen ant  cp  with coughing, more localized R parasternal and tender to touch last sev days prior to OV   Some better with saba and comfortable breathing at rest unless having a coughing fit    No obvious day to day or daytime variabilty or assoc  chest tightness, subjective wheeze overt sinus or hb symptoms. No unusual exp hx or h/o childhood pna/ asthma or knowledge of premature birth.  Sleeping ok without nocturnal  or early am exacerbation  of respiratory  c/o's or need for noct saba. Also denies any obvious fluctuation of symptoms with weather or environmental changes or other aggravating or alleviating factors except as outlined above   Current Medications, Allergies, Complete Past Medical History, Past Surgical History, Family History, and Social History were reviewed in [employer] record.  ROS  The following are not active complaints unless bolded sore throat, dysphagia, dental problems, itching, sneezing,  nasal congestion or excess/ purulent secretions, ear ache,   fever, chills, sweats, unintended wt loss, pleuritic or exertional cp, hemoptysis,  orthopnea pnd or leg swelling, presyncope, palpitations, heartburn, abdominal pain, anorexia, nausea, vomiting, diarrhea  or change in bowel or urinary habits, change in stools or urine, dysuria,hematuria,  rash, arthralgias, visual complaints, headache, numbness weakness or ataxia or problems with walking or coordination,  change in mood/affect or memory.                 Objective:   Physical Exam   amb wf nad    10/25/2013  228 >228 11/12/2013 > 02/08/2014 229 > 05/10/2014  227 >  01/17/2015  231 > 04/03/2015  231      HEENT: nl dentition, turbinates, and orophanx. Nl external ear canals without cough reflex   NECK :  without JVD/Nodes/TM/ nl carotid upstrokes bilaterally   LUNGS: prominent pseudowheeze, resolves completely with PLM   CV:  RRR  no s3 or murmur or increase in P2, no edema   ABD:  soft and nontender with nl  excursion in the supine position. No bruits or organomegaly, bowel sounds nl  MS:  warm without deformities, calf tenderness, cyanosis or clubbing  SKIN: warm and dry without lesions    NEURO:  alert, approp, no deficits       I personally reviewed images and agree with radiology impression as follows:  CXR:  04/03/15 No evidence of acute cardiopulmonary abnormality. No evidence of pneumonia. Lung volumes are normal.      Assessment & Plan:

## 2015-04-06 ENCOUNTER — Encounter: Payer: Self-pay | Admitting: Internal Medicine

## 2015-04-06 NOTE — Assessment & Plan Note (Signed)
Body mass index is 33.12 kg/(m^2).  No results found for: TSH   Contributing to gerd tendency/ doe/ needs to achieve and maintain neg calorie balance > f/u primary care

## 2015-04-06 NOTE — Assessment & Plan Note (Signed)
symbicort 80 2bid started 10/12/13  - increased symbicort to 160 2bid 10/26/2013 - Sinus CT 11/02/13 > No evidence of acute or chronic sinusitis. Negative study. - Allergy profile 10/25/13 > IgE  3.8 with min elevations of cat/ dust  - Symbicort 80 plus singulair 02/08/2014  - 02/08/2014 p extensive coaching HFA effectiveness =    90% - PFT's 05/10/2014 wnl except for fef 25-75 > ok to try off maint symbicort and just restart prn> could not maint off symb even on singulair - 01/17/2015 p extensive coaching HFA effectiveness =    95% > - Trial of qvar 80 2bid   Acute flare in setting of "head cold" but overall was doing fine on just ICS   Explained the natural history of uri and why it's necessary in patients at risk to treat GERD aggressively - at least  short term -   to reduce risk of evolving cyclical cough initially  triggered by epithelial injury and a heightened sensitivty to the effects of any upper airway irritants,  most importantly acid - related - then perpetuated by epithelial injury related to the cough itself as the upper airway collapses on itself.  That is, the more sensitive the epithelium becomes once it is damaged by the virus, the more the ensuing irritability> the more the cough, the more the secondary reflux (especially in those prone to reflux) the more the irritation of the sensitive mucosa and so on in a  Classic cyclical pattern.    Additionally rec omnicef rx and consider sinus ct if not better  I had an extended discussion with the patient reviewing all relevant studies completed to date and  lasting 15 to 20 minutes of a 25 minute visit    Each maintenance medication was reviewed in detail including most importantly the difference between maintenance and prns and under what circumstances the prns are to be triggered using an action plan format that is not reflected in the computer generated alphabetically organized AVS.    Please see instructions for details which were  reviewed in writing and the patient given a copy highlighting the part that I personally wrote and discussed at today's ov.

## 2015-11-23 ENCOUNTER — Other Ambulatory Visit: Payer: Self-pay | Admitting: Internal Medicine

## 2016-01-12 ENCOUNTER — Telehealth: Payer: Self-pay | Admitting: Internal Medicine

## 2016-01-12 MED ORDER — BUDESONIDE-FORMOTEROL FUMARATE 160-4.5 MCG/ACT IN AERO: 2.0000 | INHALATION_SPRAY | Freq: Two times a day (BID) | RESPIRATORY_TRACT | Status: DC

## 2016-01-12 MED ORDER — MONTELUKAST SODIUM 10 MG PO TABS: 10.0000 mg | ORAL_TABLET | Freq: Every day | ORAL | Status: DC

## 2016-01-12 NOTE — Telephone Encounter (Signed)
Fine with me

## 2016-01-12 NOTE — Telephone Encounter (Signed)
Last ov with MW on 04/03/15 Patient Instructions     Stop asthmanex and start symbicort 160 Take 2 puffs first thing in am and then another 2 puffs about 12 hours later then when finished go back to the asthmanex   Only use your albuterol as a rescue medication to be used if you can't catch your breath or feel Tight or wheezy  - The less you use it, the better it will work when you need it. - Ok to use up to 2 puffs every 4 hours if you must but call for immediate appointment if use goes up over your usual need - Don't leave home without it !! (think of it like the spare tire for your car)   omnicef twice daily x 10 days   Please remember to go to the x-ray department downstairs for your tests - we will call you with the results when they are available.  If not happy with asthmanex > return with your formulary list of alternatives   Called spoke with pt. She states that she has new insurance and she needs a new prescription for her singulair and for symbicort. She states that she was switched from symbicort to asthmanex due to insurance. Her new insurance will cover symbicort and she would like to switch back at this time. She is requesting a 90 day supply of both medications. I explained to her that I would need to send message to MW for approval for the symbicort. She voiced understanding and had no further questions.   MW please advise

## 2016-01-12 NOTE — Telephone Encounter (Signed)
Called spoke pt. I informed her of MW's recs and verified pharmacy as [facility]. She voiced understanding and had no further questions. Both rx sent. Nothing further needed.

## 2016-02-02 DIAGNOSIS — Z131 Encounter for screening for diabetes mellitus: Secondary | ICD-10-CM | POA: Diagnosis not present

## 2016-02-02 DIAGNOSIS — E785 Hyperlipidemia, unspecified: Secondary | ICD-10-CM | POA: Diagnosis not present

## 2016-02-02 DIAGNOSIS — E559 Vitamin D deficiency, unspecified: Secondary | ICD-10-CM | POA: Diagnosis not present

## 2016-02-02 LAB — LIPID PANEL
CHOLESTEROL: 219 mg/dL — AB (ref 0–200)
HDL: 59 mg/dL (ref 35–70)
LDL CALC: 142 mg/dL
Triglycerides: 88 mg/dL (ref 40–160)

## 2016-02-02 LAB — BASIC METABOLIC PANEL: GLUCOSE: 95 mg/dL

## 2016-02-02 LAB — VITAMIN D 25 HYDROXY (VIT D DEFICIENCY, FRACTURES): VIT D 25 HYDROXY: 33.4

## 2016-02-04 DIAGNOSIS — Z23 Encounter for immunization: Secondary | ICD-10-CM | POA: Diagnosis not present

## 2016-02-04 DIAGNOSIS — Z0000 Encounter for general adult medical examination without abnormal findings: Secondary | ICD-10-CM | POA: Diagnosis not present

## 2016-02-04 DIAGNOSIS — M8580 Other specified disorders of bone density and structure, unspecified site: Secondary | ICD-10-CM | POA: Diagnosis not present

## 2016-02-04 DIAGNOSIS — K5900 Constipation, unspecified: Secondary | ICD-10-CM | POA: Diagnosis not present

## 2016-02-04 LAB — LIPID PANEL
Cholesterol: 219 mg/dL — AB (ref 0–200)
HDL: 59 mg/dL (ref 35–70)
LDL Cholesterol: 142 mg/dL
TRIGLYCERIDES: 88 mg/dL (ref 40–160)

## 2016-02-04 LAB — BASIC METABOLIC PANEL: Glucose: 95 mg/dL

## 2016-02-04 LAB — VITAMIN D 25 HYDROXY (VIT D DEFICIENCY, FRACTURES): VIT D 25 HYDROXY: 33.4

## 2016-02-25 ENCOUNTER — Other Ambulatory Visit: Payer: Self-pay | Admitting: Family

## 2016-02-25 DIAGNOSIS — M8580 Other specified disorders of bone density and structure, unspecified site: Secondary | ICD-10-CM

## 2016-06-02 DIAGNOSIS — M8588 Other specified disorders of bone density and structure, other site: Secondary | ICD-10-CM | POA: Diagnosis not present

## 2016-06-02 DIAGNOSIS — Z1231 Encounter for screening mammogram for malignant neoplasm of breast: Secondary | ICD-10-CM | POA: Diagnosis not present

## 2016-06-02 LAB — HM MAMMOGRAPHY

## 2016-06-02 LAB — HM DEXA SCAN

## 2016-06-15 DIAGNOSIS — D225 Melanocytic nevi of trunk: Secondary | ICD-10-CM | POA: Diagnosis not present

## 2016-06-15 DIAGNOSIS — D2371 Other benign neoplasm of skin of right lower limb, including hip: Secondary | ICD-10-CM | POA: Diagnosis not present

## 2016-06-15 DIAGNOSIS — L720 Epidermal cyst: Secondary | ICD-10-CM | POA: Diagnosis not present

## 2016-06-15 DIAGNOSIS — L820 Inflamed seborrheic keratosis: Secondary | ICD-10-CM | POA: Diagnosis not present

## 2016-06-15 DIAGNOSIS — D1801 Hemangioma of skin and subcutaneous tissue: Secondary | ICD-10-CM | POA: Diagnosis not present

## 2016-06-15 DIAGNOSIS — D2361 Other benign neoplasm of skin of right upper limb, including shoulder: Secondary | ICD-10-CM | POA: Diagnosis not present

## 2016-06-15 DIAGNOSIS — L821 Other seborrheic keratosis: Secondary | ICD-10-CM | POA: Diagnosis not present

## 2016-06-15 DIAGNOSIS — D2262 Melanocytic nevi of left upper limb, including shoulder: Secondary | ICD-10-CM | POA: Diagnosis not present

## 2016-07-26 ENCOUNTER — Telehealth: Payer: Self-pay | Admitting: Internal Medicine

## 2016-07-26 NOTE — Telephone Encounter (Signed)
Spoke with pt. She is needing refills on Singulair and Symbicort. Advised her that we can't refill these medications without an appointment. OV has been scheduled for 07/27/16 at 9:15am. Nothing further was needed.

## 2016-07-27 ENCOUNTER — Ambulatory Visit (INDEPENDENT_AMBULATORY_CARE_PROVIDER_SITE_OTHER): Payer: PPO | Admitting: Internal Medicine

## 2016-07-27 ENCOUNTER — Encounter: Payer: Self-pay | Admitting: Internal Medicine

## 2016-07-27 VITALS — BP 116/80 | HR 82 | Ht 70.5 in | Wt 243.0 lb

## 2016-07-27 DIAGNOSIS — J45991 Cough variant asthma: Secondary | ICD-10-CM | POA: Diagnosis not present

## 2016-07-27 LAB — NITRIC OXIDE: Nitric Oxide: 32

## 2016-07-27 MED ORDER — BUDESONIDE-FORMOTEROL FUMARATE 80-4.5 MCG/ACT IN AERO: 2.0000 | INHALATION_SPRAY | Freq: Two times a day (BID) | RESPIRATORY_TRACT | 3 refills | Status: DC

## 2016-07-27 MED ORDER — MONTELUKAST SODIUM 10 MG PO TABS: 10.0000 mg | ORAL_TABLET | Freq: Every day | ORAL | 3 refills | Status: DC

## 2016-07-27 NOTE — Assessment & Plan Note (Addendum)
symbicort 80 2bid started 10/12/13  - increased symbicort to 160 2bid 10/26/2013 - Sinus CT 11/02/13 > No evidence of acute or chronic sinusitis. Negative study. - Allergy profile 10/25/13 > IgE  3.8 with min elevations of cat/ dust  - Symbicort 80 plus singulair 02/08/2014  - 02/08/2014 p extensive coaching HFA effectiveness =    90% - PFT's 05/10/2014 wnl except for fef 25-75 > ok to try off maint symbicort and just restart prn> could not maint off symb even on singulair - 01/17/2015  Trial of qvar 80 2bid> declined by insurace  - FENO 07/27/2016  =  32  symb 160 2bid and singulair > rec try symb 80 2bid   - The proper method of use, as well as anticipated side effects, of a metered-dose inhaler are discussed and demonstrated to the patient. Improved effectiveness after extensive coaching during this visit to a level of approximately 90 % from a baseline of 90 % > hfa still appropriate     I had an extended final summary discussion with the patient reviewing all relevant studies completed to date and  lasting 15 to 20 minutes of a 25 minute visit on the following issues:    1) All goals of chronic asthma control met including optimal function and elimination of symptoms with minimal need for rescue therapy.  Contingencies discussed in full including contacting this office immediately if not controlling the symptoms using the rule of two's.     2) FENO ok on symb 160/singulair and pt concerned re high dose symb but warned re starting 160 if having breakthru symptoms or need for saba using the rule of 2s  3) Formulary restrictions will be an ongoing challenge for the forseable future and I would be happy to pick an alternative if the pt will first  provide me a list of them but pt  will need to return here for training for any new device that is required eg dpi vs hfa vs respimat.    In meantime we can always provide samples so the patient never runs out of any needed respiratory medications.   4)  ok to try taper off ppi > f/u GI PRN - Discussed the recent press about ppi's in the context of a statistically significant (but questionably clinically relevant) increase in CRI in pts on ppi vs h2's > bottom line is the lowest dose of ppi that controls   gerd is the right dose and if that dose is zero that's fine esp since h2's are cheaper.      5) Each maintenance medication was reviewed in detail including most importantly the difference between maintenance and as needed and under what circumstances the prns are to be used.  Please see instructions for details which were reviewed in writing and the patient given a copy.    Pulmonary f/u can be prn

## 2016-07-27 NOTE — Progress Notes (Signed)
Subjective:    Patient ID: Cindy Rogers, female    DOB: 1951/01/24   MRN: [id number]   Brief patient profile:  20 yowf never smoker eczema as toddler then asthma at  Age 65-8 rx with allergy shots and some kind of resp rx ever since with sev times per year gets "bronchitis" rx abx s prednisone then onset cough Oct/Nov 2014  with fever/ nasty mucus better with abx but cough lingered and never resolved on advair maint rx referred 10/11/2013 to pulmonary clinic by Eloise Levels NP at [facility] with Upper airway cough syndrome vs UACS   History of Present Illness  10/11/2013 1st [facility] office visit/ Tonna Palazzi cc cough each am x 4 months x tbsp x green mucus thicker in ams.  On nexium and boniva/fish oil.  Albuterol helps some rec omnicef 300 mg twice daily x 10 days Prednisone 10 mg take  4 each am x 2 days,   2 each am x 2 days,  1 each am x 2 days and stop  symbicort 80 Take 2 puffs first thing in am and then another 2 puffs about 12 hours later.  Stop advair, fish oil, boniva  For cough mucinex dm up to 1200 mg every 12 hours  nexium 40  Take 30-60 min before first meal of the day and Pepcid ac 20 mg at bedtime  GERD diet   10/25/2013 f/u ov/Serria Sloma re: cough variant asthma Chief Complaint  Patient presents with  . Follow-up    Feeling better-cough not as much-milky green,no metallic taste now,no sob,occass. wheezing,denies cp or tightness   >>CT sinus = nad  rec .  Increase symbicort 160 Take 2 puffs first thing in am and then another 2 puffs about 12 hours later.  Continue nexium 40 mg before breakfast and pepcid at bedtime and stay off boniva for now        02/08/2014 f/u ov/Daleyssa Loiselle re: chronic cough x 6 m/ longstanding ? Asthma with neg allergy profile Chief Complaint  Patient presents with  . Follow-up    Pt states cough is unchanged. Still "coughing up junk every day"- minimal yellow to white sputum.   mostly in am's doesn't wake her up prematurely, ? With  swallowing made worse On symbicort 160 2 bid and gerd rx Only med that works really well is prednisone, never tried singulair  rec Try symbicort 80 Take 2 puffs first thing in am and then another 2 puffs about 12 hours later.  Add singulair 10 mg each pm  Prednisone 10 mg take  4 each am x 2 days,   2 each am x 2 days,  1 each am x 2 days and stop   05/10/2014 f/u ov/Kona Lover re: ? Cough variant asthma vs asthma Chief Complaint  Patient presents with  . Follow-up    Pt states that her cough is much improved. She is using albuterol inhaler approx once per month.   still some problem walking in heat and humidity but overall very satisfied and change def occurred after starting singulair and lowering dose of symbicort to 80 2bid rec alternative to boniva is reclast Ok to try off symbiocort to see what difference this makes and if worse at all restart the 2 every 12 hours dose   01/17/2015 f/u ov/Aniken Monestime [id number] variant asthma / recurred off symbicort 80 Chief Complaint  Patient presents with  . Follow-up    Pt states her breathing is doing well today. She had cough  a few wks ago, and had to start back on symbicort and this has helped.   Only able to stop the symbicort for a few weeks before symptoms recurred rec Qvar 80 Take 2 puffs first thing in am and then another 2 puffs about 12 hours later consistently  Ok to leave off flonase if you don't feel it's helping Only use your albuterol (proair) as rescue    04/03/2015  Acute  ov/Duanna Runk re: acute flare of cough while on asthmanex/ singulair maint rx  Chief Complaint  Patient presents with  . Acute Visit    Pt c/o increased cough- dark sputum, fever and fatigue for the past 2 wks.    Was doing great on asthmanex 100 Take 2 puffs first thing in am and then another 2 puffs about 12 hours later.  Abruptly sick x 2weeks after over heated then went into overcooled environnent in  a/c "chest cold"  Zpak/pred rx 03/24/15 > some better gen ant  cp  with coughing, more localized R parasternal and tender to touch last sev days prior to OV   Some better with saba and comfortable breathing at rest unless having a coughing fit  rec Stop asthmanex and start symbicort 160 Take 2 puffs first thing in am and then another 2 puffs about 12 hours later then when finished go back to the asthmanex   Only use your albuterol as a rescue medication to be used if you can't catch your breath or feel  Tight or wheezy   - The less you use it, the better it will work when you need it. - Ok to use up to 2 puffs  every 4 hours if you must but call for immediate appointment if use goes up over your usual need - Don't leave home without it !!  (think of it like the spare tire for your car)   omnicef twice daily x 10 days   Please remember to go to the  x-ray department downstairs for your tests - we will call you with the results when they are available.    07/27/2016  f/u ov/Naima Veldhuizen re: cough variant asthma on singulair/ symb 160  Chief Complaint  Patient presents with  . Follow-up    Breathing is doing well and she denies any new co's. She rarely uses albuterol inhaler.   recent uri > required saba twice but no pred or abx   Not limited by breathing from desired activities    No obvious day to day or daytime variabilty or assoc  chest tightness, subjective wheeze overt sinus or hb symptoms. No unusual exp hx or h/o childhood pna/ asthma or knowledge of premature birth.  Sleeping ok without nocturnal  or early am exacerbation  of respiratory  c/o's or need for noct saba. Also denies any obvious fluctuation of symptoms with weather or environmental changes or other aggravating or alleviating factors except as outlined above   Current Medications, Allergies, Complete Past Medical History, Past Surgical History, Family History, and Social History were reviewed in [employer] record.  ROS  The following are not active complaints unless  bolded sore throat, dysphagia, dental problems, itching, sneezing,  nasal congestion or excess/ purulent secretions, ear ache,   fever, chills, sweats, unintended wt loss, pleuritic or exertional cp, hemoptysis,  orthopnea pnd or leg swelling, presyncope, palpitations, heartburn, abdominal pain, anorexia, nausea, vomiting, diarrhea  or change in bowel or urinary habits, change in stools or urine, dysuria,hematuria,  rash, arthralgias,  visual complaints, headache, numbness weakness or ataxia or problems with walking or coordination,  change in mood/affect or memory.                 Objective:   Physical Exam   amb wf nad    10/25/2013  228 >228 11/12/2013 > 02/08/2014 229 > 05/10/2014  227 >  01/17/2015  231 > 04/03/2015  231 > 07/27/2016  243     HEENT: nl dentition, turbinates, and orophanx. Nl external ear canals without cough reflex   NECK :  without JVD/Nodes/TM/ nl carotid upstrokes bilaterally   LUNGS: min pseudowheeze, resolves with PLM   CV:  RRR  no s3 or murmur or increase in P2, no edema   ABD:  soft and nontender with nl excursion in the supine position. No bruits or organomegaly, bowel sounds nl  MS:  warm without deformities, calf tenderness, cyanosis or clubbing  SKIN: warm and dry without lesions    NEURO:  alert, approp, no deficits           Assessment & Plan:

## 2016-07-27 NOTE — Patient Instructions (Addendum)
  Try symbicort 80 Take 2 puffs first thing in am and then another 2 puffs about 12 hours later.    Only use your albuterol as a rescue medication to be used if you can't catch your breath by resting or doing a relaxed purse lip breathing pattern.  - The less you use it, the better it will work when you need it. - Ok to use up to 2 puffs  every 4 hours if you must but call for immediate appointment if use goes up over your usual need - Don't leave home without it !!  (think of it like the spare tire for your car)    After 2 weeks on the lower dose symbicort >>>Try to taper Nexium by using pecid ac 20 mg after bfast and supper for a couple of weeks then taper pepcid off also first stop the am dose and then a few weeks later the pm dose - if you have a flare of cough/wheeze or obvious heartburn as you taper then we need to refer you to our GI dept for other options for longterm treatment

## 2016-07-27 NOTE — Assessment & Plan Note (Signed)
Body mass index is 34.37 trending up  No results found for: TSH   Contributing to gerd tendency/ doe/reviewed the need and the process to achieve and maintain neg calorie balance > defer f/u primary care including intermittently monitoring thyroid status

## 2016-08-11 DIAGNOSIS — S61012A Laceration without foreign body of left thumb without damage to nail, initial encounter: Secondary | ICD-10-CM | POA: Diagnosis not present

## 2016-08-11 DIAGNOSIS — M79645 Pain in left finger(s): Secondary | ICD-10-CM | POA: Diagnosis not present

## 2016-11-02 DIAGNOSIS — S83281A Other tear of lateral meniscus, current injury, right knee, initial encounter: Secondary | ICD-10-CM | POA: Diagnosis not present

## 2017-01-26 ENCOUNTER — Ambulatory Visit (INDEPENDENT_AMBULATORY_CARE_PROVIDER_SITE_OTHER): Payer: PPO | Admitting: Adult Health

## 2017-01-26 ENCOUNTER — Encounter: Payer: Self-pay | Admitting: Adult Health

## 2017-01-26 VITALS — BP 127/75 | HR 76 | Temp 98.6°F | Ht 70.0 in | Wt 222.0 lb

## 2017-01-26 DIAGNOSIS — J45909 Unspecified asthma, uncomplicated: Secondary | ICD-10-CM

## 2017-01-26 DIAGNOSIS — Z0000 Encounter for general adult medical examination without abnormal findings: Secondary | ICD-10-CM

## 2017-01-26 DIAGNOSIS — J40 Bronchitis, not specified as acute or chronic: Secondary | ICD-10-CM | POA: Diagnosis not present

## 2017-01-26 DIAGNOSIS — K5904 Chronic idiopathic constipation: Secondary | ICD-10-CM

## 2017-01-26 HISTORY — DX: Bronchitis, not specified as acute or chronic: J40

## 2017-01-26 MED ORDER — AZITHROMYCIN 250 MG PO TABS: ORAL_TABLET | ORAL | 0 refills | Status: DC

## 2017-01-26 NOTE — Patient Instructions (Signed)
Asthma, Adult Asthma is a recurring condition in which the airways tighten and narrow. Asthma can make it difficult to breathe. It can cause coughing, wheezing, and shortness of breath. Asthma episodes, also called asthma attacks, range from minor to life-threatening. Asthma cannot be cured, but medicines and lifestyle changes can help control it. What are the causes? Asthma is believed to be caused by inherited (genetic) and environmental factors, but its exact cause is unknown. Asthma may be triggered by allergens, lung infections, or irritants in the air. Asthma triggers are different for each person. Common triggers include:  Animal dander.  Dust mites.  Cockroaches.  Pollen from trees or grass.  Mold.  Smoke.  Air pollutants such as dust, household cleaners, hair sprays, aerosol sprays, paint fumes, strong chemicals, or strong odors.  Cold air, weather changes, and winds (which increase molds and pollens in the air).  Strong emotional expressions such as crying or laughing hard.  Stress.  Certain medicines (such as aspirin) or types of drugs (such as beta-blockers).  Sulfites in foods and drinks. Foods and drinks that may contain sulfites include dried fruit, potato chips, and sparkling grape juice.  Infections or inflammatory conditions such as the flu, a cold, or an inflammation of the nasal membranes (rhinitis).  Gastroesophageal reflux disease (GERD).  Exercise or strenuous activity.  What are the signs or symptoms? Symptoms may occur immediately after asthma is triggered or many hours later. Symptoms include:  Wheezing.  Excessive nighttime or early morning coughing.  Frequent or severe coughing with a common cold.  Chest tightness.  Shortness of breath.  How is this diagnosed? The diagnosis of asthma is made by a review of your medical history and a physical exam. Tests may also be performed. These may include:  Lung function studies. These tests show how  much air you breathe in and out.  Allergy tests.  Imaging tests such as X-rays.  How is this treated? Asthma cannot be cured, but it can usually be controlled. Treatment involves identifying and avoiding your asthma triggers. It also involves medicines. There are 2 classes of medicine used for asthma treatment:  Controller medicines. These prevent asthma symptoms from occurring. They are usually taken every day.  Reliever or rescue medicines. These quickly relieve asthma symptoms. They are used as needed and provide short-term relief.  Your health care provider will help you create an asthma action plan. An asthma action plan is a written plan for managing and treating your asthma attacks. It includes a list of your asthma triggers and how they may be avoided. It also includes information on when medicines should be taken and when their dosage should be changed. An action plan may also involve the use of a device called a peak flow meter. A peak flow meter measures how well the lungs are working. It helps you monitor your condition. Follow these instructions at home:  Take medicines only as directed by your health care provider. Speak with your health care provider if you have questions about how or when to take the medicines.  Use a peak flow meter as directed by your health care provider. Record and keep track of readings.  Understand and use the action plan to help minimize or stop an asthma attack without needing to seek medical care.  Control your home environment in the following ways to help prevent asthma attacks: ? Do not smoke. Avoid being exposed to secondhand smoke. ? Change your heating and air conditioning filter regularly. ? Limit   and wood stoves.  Get rid of pests (such as roaches and mice) and their droppings.  Throw away plants if you see mold on them.  Clean your floors and dust regularly. Use unscented cleaning products.  Try to have someone  else vacuum for you regularly. Stay out of rooms while they are being vacuumed and for a short while afterward. If you vacuum, use a dust mask from a hardware store, a double-layered or microfilter vacuum cleaner bag, or a vacuum cleaner with a HEPA filter.  Replace carpet with wood, tile, or vinyl flooring. Carpet can trap dander and dust.  Use allergy-proof pillows, mattress covers, and box spring covers.  Wash bed sheets and blankets every week in hot water and dry them in a dryer.  Use blankets that are made of polyester or cotton.  Clean bathrooms and kitchens with bleach. If possible, have someone repaint the walls in these rooms with mold-resistant paint. Keep out of the rooms that are being cleaned and painted.  Wash hands frequently. Contact a health care provider if:  You have wheezing, shortness of breath, or a cough even if taking medicine to prevent attacks.  The colored mucus you cough up (sputum) is thicker than usual.  Your sputum changes from clear or white to yellow, green, gray, or bloody.  You have any problems that may be related to the medicines you are taking (such as a rash, itching, swelling, or trouble breathing).  You are using a reliever medicine more than 2-3 times per week.  Your peak flow is still at 50-79% of your personal best after following your action plan for 1 hour.  You have a fever. Get help right away if:  You seem to be getting worse and are unresponsive to treatment during an asthma attack.  You are short of breath even at rest.  You get short of breath when doing very little physical activity.  You have difficulty eating, drinking, or talking due to asthma symptoms.  You develop chest pain.  You develop a fast heartbeat.  You have a bluish color to your lips or fingernails.  You are light-headed, dizzy, or faint.  Your peak flow is less than 50% of your personal best. This information is not intended to replace advice given to  you by your health care provider. Make sure you discuss any questions you have with your health care provider. Document Released: 08/30/2005 Document Revised: 02/11/2016 Document Reviewed: 03/29/2013 Elsevier Interactive Patient Education  2017 [place] Many factors influence your heart health, including eating and exercise habits. Heart (coronary) risk increases with abnormal blood fat (lipid) levels. Heart-healthy meal planning includes limiting unhealthy fats, increasing healthy fats, and making other small dietary changes. This includes maintaining a healthy body weight to help keep lipid levels within a normal range. What is my plan? Your health care provider recommends that you:  Get no more than _________% of the total calories in your daily diet from fat.  Limit your intake of saturated fat to less than _________% of your total calories each day.  Limit the amount of cholesterol in your diet to less than _________ mg per day. What types of fat should I choose?  Choose healthy fats more often. Choose monounsaturated and polyunsaturated fats, such as olive oil and canola oil, flaxseeds, walnuts, almonds, and seeds.  Eat more omega-3 fats. Good choices include salmon, mackerel, sardines, tuna, flaxseed oil, and ground flaxseeds. Aim to eat fish at least two  times each week.  Limit saturated fats. Saturated fats are primarily found in animal products, such as meats, butter, and cream. Plant sources of saturated fats include palm oil, palm kernel oil, and coconut oil.  Avoid foods with partially hydrogenated oils in them. These contain trans fats. Examples of foods that contain trans fats are stick margarine, some tub margarines, cookies, crackers, and other baked goods. What general guidelines do I need to follow?  Check food labels carefully to identify foods with trans fats or high amounts of saturated fat.  Fill one half of your plate with vegetables  and green salads. Eat 4-5 servings of vegetables per day. A serving of vegetables equals 1 cup of raw leafy vegetables,  cup of raw or cooked cut-up vegetables, or  cup of vegetable juice.  Fill one fourth of your plate with whole grains. Look for the word "whole" as the first word in the ingredient list.  Fill one fourth of your plate with lean protein foods.  Eat 4-5 servings of fruit per day. A serving of fruit equals one medium whole fruit,  cup of dried fruit,  cup of fresh, frozen, or canned fruit, or  cup of 100% fruit juice.  Eat more foods that contain soluble fiber. Examples of foods that contain this type of fiber are apples, broccoli, carrots, beans, peas, and barley. Aim to get 20-30 g of fiber per day.  Eat more home-cooked food and less restaurant, buffet, and fast food.  Limit or avoid alcohol.  Limit foods that are high in starch and sugar.  Avoid fried foods.  Cook foods by using methods other than frying. Baking, boiling, grilling, and broiling are all great options. Other fat-reducing suggestions include:  Removing the skin from poultry.  Removing all visible fats from meats.  Skimming the fat off of stews, soups, and gravies before serving them.  Steaming vegetables in water or broth.  Lose weight if you are overweight. Losing just 5-10% of your initial body weight can help your overall health and prevent diseases such as diabetes and heart disease.  Increase your consumption of nuts, legumes, and seeds to 4-5 servings per week. One serving of dried beans or legumes equals  cup after being cooked, one serving of nuts equals 1 ounces, and one serving of seeds equals  ounce or 1 tablespoon.  You may need to monitor your salt (sodium) intake, especially if you have high blood pressure. Talk with your health care provider or dietitian to get more information about reducing sodium. What foods can I eat? Grains   Breads, including [place], white, pita, wheat,  raisin, rye, oatmeal, and [place]. Tortillas that are neither fried nor made with lard or trans fat. Low-fat rolls, including hotdog and hamburger buns and English muffins. Biscuits. Muffins. Waffles. Pancakes. Light popcorn. Whole-grain cereals. Flatbread. Melba toast. Pretzels. Breadsticks. Rusks. Low-fat snacks and crackers, including oyster, saltine, matzo, graham, animal, and rye. Rice and pasta, including brown rice and those that are made with whole wheat. Vegetables  All vegetables. Fruits  All fruits, but limit coconut. Meats and Other Protein Sources  Lean, well-trimmed beef, veal, pork, and lamb. Chicken and [place] without skin. All fish and shellfish. Wild duck, rabbit, pheasant, and venison. Egg whites or low-cholesterol egg substitutes. Dried beans, peas, lentils, and tofu.Seeds and most nuts. Dairy  Low-fat or nonfat cheeses, including ricotta, string, and mozzarella. Skim or 1% milk that is liquid, powdered, or evaporated. Buttermilk that is made with low-fat milk. Nonfat or  low-fat yogurt. Beverages  Mineral water. Diet carbonated beverages. Sweets and Desserts  Sherbets and fruit ices. Honey, jam, marmalade, jelly, and syrups. Meringues and gelatins. Pure sugar candy, such as hard candy, jelly beans, gumdrops, mints, marshmallows, and small amounts of dark chocolate. [employer] Eat all sweets and desserts in moderation. Fats and Oils  Nonhydrogenated (trans-free) margarines. Vegetable oils, including soybean, sesame, sunflower, olive, peanut, safflower, corn, canola, and cottonseed. Salad dressings or mayonnaise that are made with a vegetable oil. Limit added fats and oils that you use for cooking, baking, salads, and as spreads. Other  Cocoa powder. Coffee and tea. All seasonings and condiments. The items listed above may not be a complete list of recommended foods or beverages. Contact your dietitian for more options.  What foods are not recommended? Grains  Breads  that are made with saturated or trans fats, oils, or whole milk. Croissants. Butter rolls. Cheese breads. Sweet rolls. Donuts. Buttered popcorn. Chow mein noodles. High-fat crackers, such as cheese or butter crackers. Meats and Other Protein Sources  Fatty meats, such as hotdogs, short ribs, sausage, spareribs, bacon, ribeye roast or steak, and mutton. High-fat deli meats, such as salami and bologna. Caviar. Domestic duck and goose. Organ meats, such as kidney, liver, sweetbreads, brains, gizzard, chitterlings, and heart. Dairy  Cream, sour cream, cream cheese, and creamed cottage cheese. Whole milk cheeses, including blue (bleu), Monte[place], [place], [place], [place], [place], [place], [place], [place], and [place]. Whole or 2% milk that is liquid, evaporated, or condensed. Whole buttermilk. Cream sauce or high-fat cheese sauce. Yogurt that is made from whole milk. Beverages  Regular sodas and drinks with added sugar. Sweets and Desserts  Frosting. Pudding. Cookies. Cakes other than angel food cake. Candy that has milk chocolate or white chocolate, hydrogenated fat, butter, coconut, or unknown ingredients. Buttered syrups. Full-fat ice cream or ice cream drinks. Fats and Oils  Gravy that has suet, meat fat, or shortening. Cocoa butter, hydrogenated oils, palm oil, coconut oil, palm kernel oil. These can often be found in baked products, candy, fried foods, nondairy creamers, and whipped toppings. Solid fats and shortenings, including bacon fat, salt pork, lard, and butter. Nondairy cream substitutes, such as coffee creamers and sour cream substitutes. Salad dressings that are made of unknown oils, cheese, or sour cream. The items listed above may not be a complete list of foods and beverages to avoid. Contact your dietitian for more information.  This information is not intended to replace advice given to you by your health care provider. Make sure you discuss any questions you have with your health  care provider. Document Released: 06/08/2008 Document Revised: 03/19/2016 Document Reviewed: 02/21/2014 Elsevier Interactive Patient Education  2017 [employer].  Please continue all medications as directed. Please schedule annual physical with fasting labs at your convenience. Please call clinic with any questions/concerns.

## 2017-01-26 NOTE — Assessment & Plan Note (Signed)
Continue current medication regime. Followed by pulmonology.

## 2017-01-26 NOTE — Assessment & Plan Note (Signed)
Encouraged to drink at least 100 ounces water daily and increase daily fiber intake-to increase regular BMs. She is due for screening colonoscopy (last one 10 years ago).

## 2017-01-26 NOTE — Progress Notes (Signed)
Subjective:    Patient ID: Cindy Rogers, female    DOB: 1951/06/21, 66 y.o.   MRN: [id number]  HPI:  Ms. Zaffino is here to establish as a new pt.  She is very pleasant 66 year old lady.  PMH: Persistent asthma, GERD, and chronic idiopathis constipation.  She has lost 25 lbs since beginning of the year by "counting calories and exercise".  She plans on losing another 25 lbs for goal <200.  She is a retired Music therapist and enjoys playing golf 1-2 times a week  She feels that her asthma is well controlled with current medication regime.  She reports experiencing bronchitis twice a year and feels that she is developing a case now, r/t seasonal pollen.  She denies tobacco use and drinks a few glasses of wine/week.   Encouraged to drink at least 100 ounces water daily and increase daily fiber intake-to increase regular BMs. She is due for screening colonoscopy (last one 10 years ago) and annual medicare wellness OV.  Patient Care Team    Relationship Specialty Notifications Start End  Mellody Dance, DO PCP - General Family Medicine  01/26/17   Tanda Rockers, MD Consulting Physician Pulmonary Disease  01/26/17   Berle Mull, MD Consulting Physician Sports Medicine  01/26/17   Sydnee Levans, MD Consulting Physician Dermatology  01/26/17   Darleen Crocker, MD Consulting Physician Ophthalmology  01/26/17     Patient Active Problem List   Diagnosis Date Noted  . Morbid obesity due to excess calories ([place]) 04/06/2015  . Osteoporosis, unspecified 05/10/2014  . Cough variant asthma 10/13/2013  . Intrinsic asthma 12/10/2011     Past Medical History:  Diagnosis Date  . Asthma   . GERD (gastroesophageal reflux disease)   . Headache(784.0)   . Pneumonia   . PONV (postoperative nausea and vomiting)   . Shortness of breath      Past Surgical History:  Procedure Laterality Date  . ABDOMINAL HYSTERECTOMY    . CHOLECYSTECTOMY    . RHINOPLASTY       Family History  Problem Relation Age  of Onset  . Emphysema Father        smoked  . Heart disease Father   . Kidney cancer Father        with mets to liver and retina  . Heart disease Mother      History  Drug Use No     History  Alcohol Use  . Yes    Comment: glass of wine approx twice per wk     History  Smoking Status  . Never Smoker  Smokeless Tobacco  . Never Used     Outpatient Encounter Prescriptions as of 01/26/2017  Medication Sig  . albuterol (PROAIR HFA) 108 (90 BASE) MCG/ACT inhaler 2 puffs every 4 hours as needed only  if your can't catch your breath  . budesonide-formoterol (SYMBICORT) 80-4.5 MCG/ACT inhaler Inhale 2 puffs into the lungs 2 (two) times daily.  . calcium-vitamin D (OSCAL WITH D) 500-200 MG-UNIT per tablet Take 1 tablet by mouth daily.  . cholecalciferol (VITAMIN D) 1000 UNITS tablet Take 1,000 Units by mouth daily.  Marland Kitchen esomeprazole (NEXIUM) 20 MG capsule Take 1 daily before breakfast  . famotidine (PEPCID) 20 MG tablet Take by mouth one at bedtime  . fluticasone (FLONASE) 50 MCG/ACT nasal spray Place 2 sprays into the nose daily as needed. For allergies  . guaiFENesin (MUCINEX) 600 MG 12 hr tablet Take 600 mg by mouth 2 (  two) times daily as needed.  Marland Kitchen ibuprofen (ADVIL,MOTRIN) 200 MG tablet Take 600 mg by mouth every 6 (six) hours as needed. For pain  . montelukast (SINGULAIR) 10 MG tablet Take 1 tablet (10 mg total) by mouth at bedtime.  Marland Kitchen azithromycin (ZITHROMAX) 250 MG tablet Tapper Dose. 2 tabs day one, 1 tab days 2-5  . [DISCONTINUED] azithromycin (ZITHROMAX) 250 MG tablet Tapper Dose. 2 tabs day one, 1 tab days 2-5   No facility-administered encounter medications on file as of 01/26/2017.     Allergies: Food; Codeine; Penicillins; Sulfa antibiotics; and Neosporin af [miconazole]  Body mass index is 31.85 kg/m.  Blood pressure 127/75, pulse 76, temperature 98.6 F (37 C), temperature source Oral, height 5\' 10"  (1.778 m), weight 222 lb (100.7 kg), SpO2 97  %.     Review of Systems  Constitutional: Negative for activity change, appetite change, chills, diaphoresis, fatigue, fever and unexpected weight change.  HENT: Negative for congestion.   Eyes: Negative for visual disturbance.  Respiratory: Negative for cough, chest tightness, shortness of breath, wheezing and stridor.   Cardiovascular: Negative for chest pain, palpitations and leg swelling.  Gastrointestinal: Negative for abdominal distention, abdominal pain, blood in stool, constipation, diarrhea, nausea and vomiting.  Endocrine: Negative for cold intolerance, heat intolerance, polydipsia, polyphagia and polyuria.  Genitourinary: Negative for difficulty urinating, flank pain and hematuria.  Musculoskeletal: Negative for arthralgias, back pain, gait problem, joint swelling, myalgias, neck pain and neck stiffness.  Skin: Negative for color change, pallor, rash and wound.  Allergic/Immunologic: Negative for immunocompromised state.  Neurological: Negative for dizziness, tremors, weakness, light-headedness, numbness and headaches.  Hematological: Does not bruise/bleed easily.  Psychiatric/Behavioral: Negative for agitation, dysphoric mood and sleep disturbance. The patient is not nervous/anxious.        Objective:   Physical Exam  Constitutional: She is oriented to person, place, and time. She appears well-developed and well-nourished. No distress.  HENT:  Head: Normocephalic and atraumatic.  Right Ear: Hearing, tympanic membrane, external ear and ear canal normal.  Left Ear: Tympanic membrane, external ear and ear canal normal.  Nose: Nose normal. Right sinus exhibits no maxillary sinus tenderness and no frontal sinus tenderness. Left sinus exhibits no maxillary sinus tenderness and no frontal sinus tenderness.  Mouth/Throat: Uvula is midline and oropharynx is clear and moist.  Eyes: Conjunctivae are normal. Pupils are equal, round, and reactive to light.  Neck: Normal range of  motion. Neck supple.  Cardiovascular: Normal rate, regular rhythm, normal heart sounds and intact distal pulses.   No murmur heard. Pulmonary/Chest: Effort normal and breath sounds normal. No respiratory distress. She has no wheezes. She has no rales. She exhibits no tenderness.  Musculoskeletal: Normal range of motion.  Lymphadenopathy:    She has no cervical adenopathy.  Neurological: She is alert and oriented to person, place, and time. Coordination normal.  Skin: Skin is warm and dry. No rash noted. She is not diaphoretic. No erythema. No pallor.  Psychiatric: She has a normal mood and affect. Her behavior is normal. Judgment and thought content normal.  Nursing note and vitals reviewed.         Assessment & Plan:   1. Health care maintenance   2. Persistent asthma, unspecified asthma severity, unspecified whether complicated   3. Chronic idiopathic constipation   4. Intrinsic asthma   5. Bronchitis     Chronic idiopathic constipation Encouraged to drink at least 100 ounces water daily and increase daily fiber intake-to increase regular BMs. She is  due for screening colonoscopy (last one 10 years ago).  Intrinsic asthma Continue current medication regime. Followed by pulmonology.    Bronchitis Azithromycin. Increase fluids/rest/vit c.    FOLLOW-UP:  Return in about 1 year (around 01/26/2018) for Regular Follow Up.

## 2017-01-26 NOTE — Assessment & Plan Note (Signed)
Azithromycin. Increase fluids/rest/vit c.

## 2017-03-02 ENCOUNTER — Ambulatory Visit (INDEPENDENT_AMBULATORY_CARE_PROVIDER_SITE_OTHER): Payer: PPO | Admitting: Adult Health

## 2017-03-02 ENCOUNTER — Encounter: Payer: Self-pay | Admitting: Adult Health

## 2017-03-02 VITALS — BP 133/77 | HR 65 | Ht 69.75 in | Wt 218.0 lb

## 2017-03-02 DIAGNOSIS — Z23 Encounter for immunization: Secondary | ICD-10-CM

## 2017-03-02 DIAGNOSIS — M810 Age-related osteoporosis without current pathological fracture: Secondary | ICD-10-CM

## 2017-03-02 DIAGNOSIS — E559 Vitamin D deficiency, unspecified: Secondary | ICD-10-CM | POA: Diagnosis not present

## 2017-03-02 DIAGNOSIS — R5383 Other fatigue: Secondary | ICD-10-CM

## 2017-03-02 DIAGNOSIS — J45909 Unspecified asthma, uncomplicated: Secondary | ICD-10-CM

## 2017-03-02 DIAGNOSIS — Z0000 Encounter for general adult medical examination without abnormal findings: Secondary | ICD-10-CM | POA: Insufficient documentation

## 2017-03-02 DIAGNOSIS — K5904 Chronic idiopathic constipation: Secondary | ICD-10-CM

## 2017-03-02 MED ORDER — ZOSTER VAC RECOMB ADJUVANTED 50 MCG/0.5ML IM SUSR: 0.5000 mL | Freq: Once | INTRAMUSCULAR | 1 refills | Status: AC

## 2017-03-02 NOTE — Assessment & Plan Note (Signed)
Improving with increased water and fiber intake. Continues to exercise and follow a heart healthy diet.

## 2017-03-02 NOTE — Patient Instructions (Signed)
Heart-Healthy Eating Plan Many factors influence your heart health, including eating and exercise habits. Heart (coronary) risk increases with abnormal blood fat (lipid) levels. Heart-healthy meal planning includes limiting unhealthy fats, increasing healthy fats, and making other small dietary changes. This includes maintaining a healthy body weight to help keep lipid levels within a normal range. What is my plan? Your health care provider recommends that you:  Get no more than _________% of the total calories in your daily diet from fat.  Limit your intake of saturated fat to less than _________% of your total calories each day.  Limit the amount of cholesterol in your diet to less than _________ mg per day.  What types of fat should I choose?  Choose healthy fats more often. Choose monounsaturated and polyunsaturated fats, such as olive oil and canola oil, flaxseeds, walnuts, almonds, and seeds.  Eat more omega-3 fats. Good choices include salmon, mackerel, sardines, tuna, flaxseed oil, and ground flaxseeds. Aim to eat fish at least two times each week.  Limit saturated fats. Saturated fats are primarily found in animal products, such as meats, butter, and cream. Plant sources of saturated fats include palm oil, palm kernel oil, and coconut oil.  Avoid foods with partially hydrogenated oils in them. These contain trans fats. Examples of foods that contain trans fats are stick margarine, some tub margarines, cookies, crackers, and other baked goods. What general guidelines do I need to follow?  Check food labels carefully to identify foods with trans fats or high amounts of saturated fat.  Fill one half of your plate with vegetables and green salads. Eat 4-5 servings of vegetables per day. A serving of vegetables equals 1 cup of raw leafy vegetables,  cup of raw or cooked cut-up vegetables, or  cup of vegetable juice.  Fill one fourth of your plate with whole grains. Look for the word  "whole" as the first word in the ingredient list.  Fill one fourth of your plate with lean protein foods.  Eat 4-5 servings of fruit per day. A serving of fruit equals one medium whole fruit,  cup of dried fruit,  cup of fresh, frozen, or canned fruit, or  cup of 100% fruit juice.  Eat more foods that contain soluble fiber. Examples of foods that contain this type of fiber are apples, broccoli, carrots, beans, peas, and barley. Aim to get 20-30 g of fiber per day.  Eat more home-cooked food and less restaurant, buffet, and fast food.  Limit or avoid alcohol.  Limit foods that are high in starch and sugar.  Avoid fried foods.  Cook foods by using methods other than frying. Baking, boiling, grilling, and broiling are all great options. Other fat-reducing suggestions include: ? Removing the skin from poultry. ? Removing all visible fats from meats. ? Skimming the fat off of stews, soups, and gravies before serving them. ? Steaming vegetables in water or broth.  Lose weight if you are overweight. Losing just 5-10% of your initial body weight can help your overall health and prevent diseases such as diabetes and heart disease.  Increase your consumption of nuts, legumes, and seeds to 4-5 servings per week. One serving of dried beans or legumes equals  cup after being cooked, one serving of nuts equals 1 ounces, and one serving of seeds equals  ounce or 1 tablespoon.  You may need to monitor your salt (sodium) intake, especially if you have high blood pressure. Talk with your health care provider or dietitian to get  more information about reducing sodium. What foods can I eat? Grains  Breads, including [place], white, pita, wheat, raisin, rye, oatmeal, and [place]. Tortillas that are neither fried nor made with lard or trans fat. Low-fat rolls, including hotdog and hamburger buns and English muffins. Biscuits. Muffins. Waffles. Pancakes. Light popcorn. Whole-grain cereals. Flatbread.  Melba toast. Pretzels. Breadsticks. Rusks. Low-fat snacks and crackers, including oyster, saltine, matzo, graham, animal, and rye. Rice and pasta, including brown rice and those that are made with whole wheat. Vegetables All vegetables. Fruits All fruits, but limit coconut. Meats and Other Protein Sources Lean, well-trimmed beef, veal, pork, and lamb. Chicken and [place] without skin. All fish and shellfish. Wild duck, rabbit, pheasant, and venison. Egg whites or low-cholesterol egg substitutes. Dried beans, peas, lentils, and tofu.Seeds and most nuts. Dairy Low-fat or nonfat cheeses, including ricotta, string, and mozzarella. Skim or 1% milk that is liquid, powdered, or evaporated. Buttermilk that is made with low-fat milk. Nonfat or low-fat yogurt. Beverages Mineral water. Diet carbonated beverages. Sweets and Desserts Sherbets and fruit ices. Honey, jam, marmalade, jelly, and syrups. Meringues and gelatins. Pure sugar candy, such as hard candy, jelly beans, gumdrops, mints, marshmallows, and small amounts of dark chocolate. [employer] Eat all sweets and desserts in moderation. Fats and Oils Nonhydrogenated (trans-free) margarines. Vegetable oils, including soybean, sesame, sunflower, olive, peanut, safflower, corn, canola, and cottonseed. Salad dressings or mayonnaise that are made with a vegetable oil. Limit added fats and oils that you use for cooking, baking, salads, and as spreads. Other Cocoa powder. Coffee and tea. All seasonings and condiments. The items listed above may not be a complete list of recommended foods or beverages. Contact your dietitian for more options. What foods are not recommended? Grains Breads that are made with saturated or trans fats, oils, or whole milk. Croissants. Butter rolls. Cheese breads. Sweet rolls. Donuts. Buttered popcorn. Chow mein noodles. High-fat crackers, such as cheese or butter crackers. Meats and Other Protein Sources Fatty meats, such  as hotdogs, short ribs, sausage, spareribs, bacon, ribeye roast or steak, and mutton. High-fat deli meats, such as salami and bologna. Caviar. Domestic duck and goose. Organ meats, such as kidney, liver, sweetbreads, brains, gizzard, chitterlings, and heart. Dairy Cream, sour cream, cream cheese, and creamed cottage cheese. Whole milk cheeses, including blue (bleu), Monte[place] Jack, [place], [place], [place], [place], [place], [place], [place], and [place]. Whole or 2% milk that is liquid, evaporated, or condensed. Whole buttermilk. Cream sauce or high-fat cheese sauce. Yogurt that is made from whole milk. Beverages Regular sodas and drinks with added sugar. Sweets and Desserts Frosting. Pudding. Cookies. Cakes other than angel food cake. Candy that has milk chocolate or white chocolate, hydrogenated fat, butter, coconut, or unknown ingredients. Buttered syrups. Full-fat ice cream or ice cream drinks. Fats and Oils Gravy that has suet, meat fat, or shortening. Cocoa butter, hydrogenated oils, palm oil, coconut oil, palm kernel oil. These can often be found in baked products, candy, fried foods, nondairy creamers, and whipped toppings. Solid fats and shortenings, including bacon fat, salt pork, lard, and butter. Nondairy cream substitutes, such as coffee creamers and sour cream substitutes. Salad dressings that are made of unknown oils, cheese, or sour cream. The items listed above may not be a complete list of foods and beverages to avoid. Contact your dietitian for more information. This information is not intended to replace advice given to you by your health care provider. Make sure you discuss any questions you have with your health care  provider. Document Released: 06/08/2008 Document Revised: 03/19/2016 Document Reviewed: 02/21/2014 Elsevier Interactive Patient Education  2017 [place].   Asthma, Adult Asthma is a condition of the lungs in which the airways tighten and narrow. Asthma can make  it hard to breathe. Asthma cannot be cured, but medicine and lifestyle changes can help control it. Asthma may be started (triggered) by:  Animal skin flakes (dander).  Dust.  Cockroaches.  Pollen.  Mold.  Smoke.  Cleaning products.  Hair sprays or aerosol sprays.  Paint fumes or strong smells.  Cold air, weather changes, and winds.  Crying or laughing hard.  Stress.  Certain medicines or drugs.  Foods, such as dried fruit, potato chips, and sparkling grape juice.  Infections or conditions (colds, flu).  Exercise.  Certain medical conditions or diseases.  Exercise or tiring activities.  Follow these instructions at home:  Take medicine as told by your doctor.  Use a peak flow meter as told by your doctor. A peak flow meter is a tool that measures how well the lungs are working.  Record and keep track of the peak flow meter's readings.  Understand and use the asthma action plan. An asthma action plan is a written plan for taking care of your asthma and treating your attacks.  To help prevent asthma attacks: ? Do not smoke. Stay away from secondhand smoke. ? Change your heating and air conditioning filter often. ? Limit your use of fireplaces and wood stoves. ? Get rid of pests (such as roaches and mice) and their droppings. ? Throw away plants if you see mold on them. ? Clean your floors. Dust regularly. Use cleaning products that do not smell. ? Have someone vacuum when you are not home. Use a vacuum cleaner with a HEPA filter if possible. ? Replace carpet with wood, tile, or vinyl flooring. Carpet can trap animal skin flakes and dust. ? Use allergy-proof pillows, mattress covers, and box spring covers. ? Wash bed sheets and blankets every week in hot water and dry them in a dryer. ? Use blankets that are made of polyester or cotton. ? Clean bathrooms and kitchens with bleach. If possible, have someone repaint the walls in these rooms with mold-resistant  paint. Keep out of the rooms that are being cleaned and painted. ? Wash hands often. Contact a doctor if:  You have make a whistling sound when breaking (wheeze), have shortness of breath, or have a cough even if taking medicine to prevent attacks.  The colored mucus you cough up (sputum) is thicker than usual.  The colored mucus you cough up changes from clear or white to yellow, green, gray, or bloody.  You have problems from the medicine you are taking such as: ? A rash. ? Itching. ? Swelling. ? Trouble breathing.  You need reliever medicines more than 2-3 times a week.  Your peak flow measurement is still at 50-79% of your personal best after following the action plan for 1 hour.  You have a fever. Get help right away if:  You seem to be worse and are not responding to medicine during an asthma attack.  You are short of breath even at rest.  You get short of breath when doing very little activity.  You have trouble eating, drinking, or talking.  You have chest pain.  You have a fast heartbeat.  Your lips or fingernails start to turn blue.  You are light-headed, dizzy, or faint.  Your peak flow is less than 50%  of your personal best. This information is not intended to replace advice given to you by your health care provider. Make sure you discuss any questions you have with your health care provider. Document Released: 02/16/2008 Document Revised: 02/05/2016 Document Reviewed: 03/29/2013 Elsevier Interactive Patient Education  2017 [place] all medications as directed. EXCELLENT JOB on healthy eating and regular exercise! Will call when lab results are available. Annual follow-up, sooner if needed. NICE TO SEE YOU!

## 2017-03-02 NOTE — Progress Notes (Signed)
Subjective:    Patient ID: Cindy Rogers, female    DOB: 27-Mar-1951, 66 y.o.   MRN: [id number]  HPI: Cindy Rogers is here for Medicare Wellness Visit and fasting labs.  She is a very healthy, active 66 year old female.  She continues to loss weight via TLC, she has lost 4 lbs since last OV in mid May.  She denies any acute complaints/pain.  She denies tobacco use or excessive ETOH use.  She has hx of vit d deficiency and would like her levels to be checked today.  She continues to play golf several times a week and is thrilled "that I am fitting into old clothes, that I have not been able to fit in for years"-GREAT JOB! She is able to easily perform ADLs, IADLs.    Subjective:    Cindy Rogers is a 66 y.o. female who presents for Medicare Annual/Subsequent preventive examination.  Preventive Screening-Counseling & Management  Tobacco History  Smoking Status  . Never Smoker  Smokeless Tobacco  . Never Used     Problems Prior to Visit 1.   Current Problems (verified) Patient Active Problem List   Diagnosis Date Noted  . Medicare annual wellness visit, subsequent 03/02/2017  . Chronic idiopathic constipation 01/26/2017  . Bronchitis 01/26/2017  . Osteoporosis of femur without pathological fracture 05/10/2014  . Cough variant asthma 10/13/2013  . Intrinsic asthma 12/10/2011    Medications Prior to Visit Current Outpatient Prescriptions on File Prior to Visit  Medication Sig Dispense Refill  . albuterol (PROAIR HFA) 108 (90 BASE) MCG/ACT inhaler 2 puffs every 4 hours as needed only  if your can't catch your breath 1 Inhaler 1  . budesonide-formoterol (SYMBICORT) 80-4.5 MCG/ACT inhaler Inhale 2 puffs into the lungs 2 (two) times daily. 3 Inhaler 3  . calcium-vitamin D (OSCAL WITH D) 500-200 MG-UNIT per tablet Take 1 tablet by mouth daily.    . cholecalciferol (VITAMIN D) 1000 UNITS tablet Take 1,000 Units by mouth daily.    Marland Kitchen esomeprazole (NEXIUM) 20 MG capsule Take 1 daily before  breakfast    . famotidine (PEPCID) 20 MG tablet Take by mouth one at bedtime    . fluticasone (FLONASE) 50 MCG/ACT nasal spray Place 2 sprays into the nose daily as needed. For allergies    . ibuprofen (ADVIL,MOTRIN) 200 MG tablet Take 600 mg by mouth every 6 (six) hours as needed. For pain    . montelukast (SINGULAIR) 10 MG tablet Take 1 tablet (10 mg total) by mouth at bedtime. 90 tablet 3  . guaiFENesin (MUCINEX) 600 MG 12 hr tablet Take 600 mg by mouth 2 (two) times daily as needed.     No current facility-administered medications on file prior to visit.     Current Medications (verified) Current Outpatient Prescriptions  Medication Sig Dispense Refill  . albuterol (PROAIR HFA) 108 (90 BASE) MCG/ACT inhaler 2 puffs every 4 hours as needed only  if your can't catch your breath 1 Inhaler 1  . budesonide-formoterol (SYMBICORT) 80-4.5 MCG/ACT inhaler Inhale 2 puffs into the lungs 2 (two) times daily. 3 Inhaler 3  . calcium-vitamin D (OSCAL WITH D) 500-200 MG-UNIT per tablet Take 1 tablet by mouth daily.    . cholecalciferol (VITAMIN D) 1000 UNITS tablet Take 1,000 Units by mouth daily.    Marland Kitchen esomeprazole (NEXIUM) 20 MG capsule Take 1 daily before breakfast    . famotidine (PEPCID) 20 MG tablet Take by mouth one at bedtime    .  fluticasone (FLONASE) 50 MCG/ACT nasal spray Place 2 sprays into the nose daily as needed. For allergies    . ibuprofen (ADVIL,MOTRIN) 200 MG tablet Take 600 mg by mouth every 6 (six) hours as needed. For pain    . montelukast (SINGULAIR) 10 MG tablet Take 1 tablet (10 mg total) by mouth at bedtime. 90 tablet 3  . guaiFENesin (MUCINEX) 600 MG 12 hr tablet Take 600 mg by mouth 2 (two) times daily as needed.    . Zoster Vac Recomb Adjuvanted [facility]) injection Inject 0.5 mLs into the muscle once. 0.5 mL 1   No current facility-administered medications for this visit.      Allergies (verified) Food; Codeine; Penicillins; Sulfa antibiotics; and Neosporin af  [miconazole]   PAST HISTORY  Family History Family History  Problem Relation Age of Onset  . Emphysema Father        smoked  . Heart disease Father   . Kidney cancer Father        with mets to liver and retina  . Heart disease Mother     Social History Social History  Substance Use Topics  . Smoking status: Never Smoker  . Smokeless tobacco: Never Used  . Alcohol use Yes     Comment: glass of wine approx twice per wk     Are there smokers in your home (other than you)? No  Risk Factors Current exercise habits: Home exercise routine includes walking 1 hrs per day.  Dietary issues discussed:   Cardiac risk factors: advanced age (older than 74 for men, 10 for women).  Depression screen [facility] 2/9 03/02/2017  Decreased Interest 0  Down, Depressed, Hopeless 0  PHQ - 2 Score 0  Altered sleeping 0  Tired, decreased energy 0  Change in appetite 0  Feeling bad or failure about yourself  0  Trouble concentrating 0  Moving slowly or fidgety/restless 0  Suicidal thoughts 0  PHQ-9 Score 0   Activities of Daily Living In your present state of health, do you have difficulty performing the following activities?  1- Driving - no 2- Managing money - no 3- Feeding yourself - no 4- Getting from the bed to the chair - no 5- Climbing a flight of stairs - no 6- Preparing food and eating - no 7- Bathing or showering - no 8- Getting dressed - no 9- Getting to the toilet - no 10- Using the toilet - no 11- Moving around from place to place - no    Hearing Difficulties: No Do you often ask people to speak up or repeat themselves? No Do you experience ringing or noises in your ears? Yes Do you have difficulty understanding soft or whispered voices? No   Do you feel that you have a problem with memory? No  Do you often misplace items? No  Do you feel safe at home?  Yes  6CIT Screen 03/02/2017  What Year? 0 points  What month? 0 points  What time? 0 points  Count back from 20 0  points  Months in reverse 0 points  Repeat phrase 0 points  Total Score 0   Current Exercise Habits: Home exercise routine, Type of exercise: yoga;Other - see comments (golf, boot camp, gardening)   Functional Status Survey: Is the patient deaf or have difficulty hearing?: No Does the patient have difficulty seeing, even when wearing glasses/contacts?: No Does the patient have difficulty concentrating, remembering, or making decisions?: No Does the patient have difficulty walking or climbing stairs?:  No Does the patient have difficulty dressing or bathing?: No Does the patient have difficulty doing errands alone such as visiting a doctor's office or shopping?: No   List the Names of Other Physician/Practitioners you currently use: 1.    Indicate any recent Medical Services you may have received from other than [facility] providers in the past year (date may be approximate).  Immunization History  Administered Date(s) Administered  . Influenza Split 06/13/2013, 06/13/2014  . Influenza Whole 06/13/2016  . Pneumococcal Conjugate-13 02/04/2016  . Pneumococcal Polysaccharide-23 04/21/2011  . Tdap 03/27/2009    Screening Tests Health Maintenance  Topic Date Due  . Hepatitis C Screening  April 08, 1951  . PNA vac Low Risk Adult (2 of 2 - PPSV23) 02/03/2017  . INFLUENZA VACCINE  04/13/2017  . MAMMOGRAM  06/02/2018  . TETANUS/TDAP  03/28/2019  . COLONOSCOPY  03/10/2020  . DEXA SCAN  Completed    All answers were reviewed with the patient and necessary referrals were made:  Esaw Grandchild, NP   03/02/2017   History reviewed: current medications, past family history, past medical history, past social history, past surgical history and problem list  Review of Systems Pertinent items are noted in HPI.    Objective:     Vision by Snellen chart: right UYQ:IHKVQQV declines measurement, left ZDG:LOVFIEP declines measurement  Body mass index is 31.5 kg/m. BP 133/77   Pulse 65   Ht 5'  9.75" (1.772 m)   Wt 218 lb (98.9 kg)   SpO2 96%   BMI 31.50 kg/m   BP 133/77   Pulse 65   Ht 5' 9.75" (1.772 m)   Wt 218 lb (98.9 kg)   SpO2 96%   BMI 31.50 kg/m   General Appearance:    Alert, cooperative, no distress, appears stated age  Head:    Normocephalic, without obvious abnormality, atraumatic  Eyes:    PERRL, conjunctiva/corneas clear, EOM's intact, fundi    benign, both eyes  Ears:    Normal TM's and external ear canals, both ears  Nose:   Nares normal, septum midline, mucosa normal, no drainage    or sinus tenderness  Throat:   Lips, mucosa, and tongue normal; teeth and gums normal  Neck:   Supple, symmetrical, trachea midline, no adenopathy;    thyroid:  no enlargement/tenderness/nodules; no carotid   bruit or JVD  Back:     Symmetric, no curvature, ROM normal, no CVA tenderness  Lungs:     Clear to auscultation bilaterally, respirations unlabored  Chest Wall:    No tenderness or deformity   Heart:    Regular rate and rhythm, S1 and S2 normal, no murmur, rub   or gallop  Breast Exam:    No tenderness, masses, or nipple abnormality  Abdomen:     Soft, non-tender, bowel sounds active all four quadrants,    no masses, no organomegaly  Genitalia:    Normal female without lesion, discharge or tenderness  Rectal:    Normal tone, normal prostate, no masses or tenderness;   guaiac negative stool  Extremities:   Extremities normal, atraumatic, no cyanosis or edema  Pulses:   2+ and symmetric all extremities  Skin:   Skin color, texture, turgor normal, no rashes or lesions  Lymph nodes:   Cervical, supraclavicular, and axillary nodes normal  Neurologic:   CNII-XII intact, normal strength, sensation and reflexes    throughout       Assessment:    Please see below. Plan:  During the course of the visit the patient was educated and counseled about appropriate screening and preventive services including:    Pneumococcal vaccine   Diet review for nutrition  referral? Yes ____  Not Indicated __X__   Patient Instructions (the written plan) was given to the patient.  Medicare Attestation I have personally reviewed: The patient's medical and social history Their use of alcohol, tobacco or illicit drugs Their current medications and supplements The patient's functional ability including ADLs,fall risks, home safety risks, cognitive, and hearing and visual impairment Diet and physical activities Evidence for depression or mood disorders  The patient's weight, height, BMI, and visual acuity have been recorded in the chart.  I have made referrals, counseling, and provided education to the patient based on review of the above and I have provided the patient with a written personalized care plan for preventive services.     Esaw Grandchild, NP   03/02/2017           Review of Systems  Constitutional: Positive for fatigue. Negative for activity change, appetite change, chills, diaphoresis, fever and unexpected weight change.  HENT: Negative for congestion, postnasal drip and rhinorrhea.   Eyes: Negative for visual disturbance.  Respiratory: Negative for cough, chest tightness, shortness of breath, wheezing and stridor.   Cardiovascular: Negative for chest pain, palpitations and leg swelling.  Gastrointestinal: Negative for abdominal distention, abdominal pain, blood in stool, constipation, diarrhea, nausea and vomiting.  Endocrine: Negative for cold intolerance, heat intolerance, polydipsia, polyphagia and polyuria.  Genitourinary: Negative for difficulty urinating, flank pain and pelvic pain.  Musculoskeletal: Negative for arthralgias, back pain, gait problem, joint swelling, myalgias, neck pain and neck stiffness.  Skin: Negative for color change, pallor, rash and wound.  Allergic/Immunologic: Negative for immunocompromised state.  Neurological: Negative for dizziness, weakness, light-headedness and headaches.  Hematological: Does not  bruise/bleed easily.  Psychiatric/Behavioral: Negative for agitation, decreased concentration, self-injury, sleep disturbance and suicidal ideas. The patient is not nervous/anxious and is not hyperactive.        Objective:   Physical Exam  Constitutional: She is oriented to person, place, and time. She appears well-developed and well-nourished.  HENT:  Head: Normocephalic and atraumatic.  Right Ear: Hearing, tympanic membrane, external ear and ear canal normal. No decreased hearing is noted.  Left Ear: Hearing, tympanic membrane, external ear and ear canal normal. No decreased hearing is noted.  Nose: Nose normal. Right sinus exhibits no maxillary sinus tenderness and no frontal sinus tenderness. Left sinus exhibits no maxillary sinus tenderness and no frontal sinus tenderness.  Mouth/Throat: Uvula is midline, oropharynx is clear and moist and mucous membranes are normal.  Eyes: Conjunctivae are normal. Pupils are equal, round, and reactive to light.  Neck: Normal range of motion. Neck supple.  Cardiovascular: Normal rate, regular rhythm, normal heart sounds and intact distal pulses.   No murmur heard. Pulmonary/Chest: Effort normal and breath sounds normal. No respiratory distress. She has no wheezes. She has no rales. She exhibits no tenderness.  Abdominal: Soft. Bowel sounds are normal. She exhibits no distension and no mass. There is no tenderness. There is no rebound and no guarding.  Musculoskeletal: Normal range of motion. She exhibits no edema.  Lymphadenopathy:    She has no cervical adenopathy.  Neurological: She is alert and oriented to person, place, and time. Coordination normal.  Skin: Skin is warm and dry. No rash noted. She is not diaphoretic. No erythema. No pallor.  Psychiatric: She has a normal mood and affect.  Her behavior is normal. Judgment and thought content normal.  Nursing note and vitals reviewed.         Assessment & Plan:   1. Need for shingles vaccine    2. Intrinsic asthma   3. Chronic idiopathic constipation   4. Osteoporosis of femur without pathological fracture   5. Healthcare maintenance   6. Vitamin D deficiency   7. Fatigue, unspecified type   8. Medicare annual wellness visit, subsequent     Intrinsic asthma Very well controlled with montelukast 10mg  daily, symbicort BID, and regular exercise.  Followed by Pulmonology/Dr. Melvyn Novas.  Chronic idiopathic constipation Improving with increased water and fiber intake. Continues to exercise and follow a heart healthy diet.  Medicare annual wellness visit, subsequent Wellness visit completed. Health maintenance verified/updated.     FOLLOW-UP:  Return in about 1 year (around 03/02/2018) for Regular Follow Up.

## 2017-03-02 NOTE — Assessment & Plan Note (Signed)
Wellness visit completed. Health maintenance verified/updated.

## 2017-03-02 NOTE — Assessment & Plan Note (Signed)
Very well controlled with montelukast 10mg  daily, symbicort BID, and regular exercise.  Followed by Pulmonology/Dr. Melvyn Novas.

## 2017-03-03 ENCOUNTER — Telehealth: Payer: Self-pay

## 2017-03-03 ENCOUNTER — Other Ambulatory Visit: Payer: Self-pay | Admitting: Adult Health

## 2017-03-03 DIAGNOSIS — E785 Hyperlipidemia, unspecified: Secondary | ICD-10-CM | POA: Insufficient documentation

## 2017-03-03 DIAGNOSIS — E7800 Pure hypercholesterolemia, unspecified: Secondary | ICD-10-CM

## 2017-03-03 LAB — CBC WITH DIFFERENTIAL/PLATELET
BASOS ABS: 0 10*3/uL (ref 0.0–0.2)
Basos: 1 %
EOS (ABSOLUTE): 0.1 10*3/uL (ref 0.0–0.4)
Eos: 3 %
HEMOGLOBIN: 13.2 g/dL (ref 11.1–15.9)
Hematocrit: 41.2 % (ref 34.0–46.6)
IMMATURE GRANS (ABS): 0 10*3/uL (ref 0.0–0.1)
Immature Granulocytes: 0 %
LYMPHS: 38 %
Lymphocytes Absolute: 1.2 10*3/uL (ref 0.7–3.1)
MCH: 28.4 pg (ref 26.6–33.0)
MCHC: 32 g/dL (ref 31.5–35.7)
MCV: 89 fL (ref 79–97)
Monocytes Absolute: 0.3 10*3/uL (ref 0.1–0.9)
Monocytes: 9 %
Neutrophils Absolute: 1.5 10*3/uL (ref 1.4–7.0)
Neutrophils: 49 %
Platelets: 175 10*3/uL (ref 150–379)
RBC: 4.65 x10E6/uL (ref 3.77–5.28)
RDW: 13.8 % (ref 12.3–15.4)
WBC: 3 10*3/uL — ABNORMAL LOW (ref 3.4–10.8)

## 2017-03-03 LAB — COMPREHENSIVE METABOLIC PANEL
ALBUMIN: 4.6 g/dL (ref 3.6–4.8)
ALT: 19 IU/L (ref 0–32)
AST: 18 IU/L (ref 0–40)
Albumin/Globulin Ratio: 2.3 — ABNORMAL HIGH (ref 1.2–2.2)
Alkaline Phosphatase: 59 IU/L (ref 39–117)
BUN / CREAT RATIO: 17 (ref 12–28)
BUN: 16 mg/dL (ref 8–27)
Bilirubin Total: 0.5 mg/dL (ref 0.0–1.2)
CALCIUM: 10.5 mg/dL — AB (ref 8.7–10.3)
CO2: 25 mmol/L (ref 20–29)
CREATININE: 0.92 mg/dL (ref 0.57–1.00)
Chloride: 108 mmol/L — ABNORMAL HIGH (ref 96–106)
GFR calc non Af Amer: 65 mL/min/{1.73_m2} (ref >59)
GFR, EST AFRICAN AMERICAN: 75 mL/min/{1.73_m2} (ref >59)
Globulin, Total: 2 g/dL (ref 1.5–4.5)
Glucose: 91 mg/dL (ref 65–99)
Potassium: 5.1 mmol/L (ref 3.5–5.2)
Sodium: 146 mmol/L — ABNORMAL HIGH (ref 134–144)
Total Protein: 6.6 g/dL (ref 6.0–8.5)

## 2017-03-03 LAB — VITAMIN B12: Vitamin B-12: 345 pg/mL (ref 232–1245)

## 2017-03-03 LAB — LIPID PANEL
CHOL/HDL RATIO: 3.7 ratio (ref 0.0–4.4)
Cholesterol, Total: 224 mg/dL — ABNORMAL HIGH (ref 100–199)
HDL: 61 mg/dL (ref >39)
LDL CALC: 147 mg/dL — AB (ref 0–99)
Triglycerides: 82 mg/dL (ref 0–149)
VLDL Cholesterol Cal: 16 mg/dL (ref 5–40)

## 2017-03-03 LAB — HEPATITIS C ANTIBODY

## 2017-03-03 LAB — HEMOGLOBIN A1C
Est. average glucose Bld gHb Est-mCnc: 105 mg/dL
HEMOGLOBIN A1C: 5.3 % (ref 4.8–5.6)

## 2017-03-03 LAB — TSH: TSH: 4.57 u[IU]/mL — ABNORMAL HIGH (ref 0.450–4.500)

## 2017-03-03 LAB — VITAMIN D 25 HYDROXY (VIT D DEFICIENCY, FRACTURES): VIT D 25 HYDROXY: 26.7 ng/mL — AB (ref 30.0–100.0)

## 2017-03-03 MED ORDER — ATORVASTATIN CALCIUM 10 MG PO TABS: 10.0000 mg | ORAL_TABLET | Freq: Every day | ORAL | 3 refills | Status: DC

## 2017-03-03 NOTE — Telephone Encounter (Signed)
-----   Message from Esaw Grandchild, NP sent at 03/03/2017  1:51 PM EDT ----- Kermit Balo Afternoon, Please call Ms. Cindy Rogers and share that: 1) Hepatitis Screening was negative  2) Vit d low at 26.7, she can increase her daily OTC Vit d from 1000 IU to 2000 IU 3) Lipid panel:  Tot Chol 224 (normal <199), TG 82 (normal <149), HDL 61 (normal >39), LDL 147 (normal <99)-these numbers generate a ASCVD risk of 6.8%, optimal is 4.2% (which means her rick of cardiovascular event is increased). Guidelines recommend to start a moderate intensity statin, I sent in Atorvastatin 10mg , we will need to re-check lipids 6-8 weeks. He LFTs look great, we will need to re-check these is 12 weeks.  Please have her schedule nurse visit for fasting labs in 6-8 weeks. CBC, TSH, CMP stable A1c 5.3-normal. I sent in Rx and put in lab orders. Thanks! Valetta Fuller

## 2017-03-03 NOTE — Assessment & Plan Note (Signed)
Ref Range & Units 1d ago (03/02/17) 57yr ago (02/04/16) 30yr ago (02/02/16)   Cholesterol, Total 100 - 199 mg/dL 224       Triglycerides 0 - 149 mg/dL 82  88R  88R    HDL >39 mg/dL 61  59R  59R    VLDL Cholesterol Cal 5 - 40 mg/dL 16      LDL Calculated 0 - 99 mg/dL 147   142R  142R    Chol/HDL Ratio 0.0 - 4.4 ratio 3.7     Comment:                 T. Chol/HDL Ratio    Tot chol and LDL have both steadily been increasing. Started on moderate intensity statin-Atorvastatin 10mg  daily. Re-check lipids in 6-8 weeks, re-check LFTs in 12 weeks.

## 2017-03-03 NOTE — Telephone Encounter (Signed)
Left message informing patient to call office regarding lab results and recommendations.

## 2017-03-04 ENCOUNTER — Telehealth: Payer: Self-pay | Admitting: Adult Health

## 2017-03-04 NOTE — Telephone Encounter (Signed)
Patient returned clinical call about lab results and states to call her back when someone becomes available.

## 2017-03-07 NOTE — Telephone Encounter (Signed)
Informed patient of lab results and recommendations.  She refuses lipitor.  Follow up appointment made.

## 2017-05-02 ENCOUNTER — Encounter: Payer: Self-pay | Admitting: Adult Health

## 2017-05-02 ENCOUNTER — Ambulatory Visit (INDEPENDENT_AMBULATORY_CARE_PROVIDER_SITE_OTHER): Payer: PPO | Admitting: Adult Health

## 2017-05-02 VITALS — BP 122/77 | HR 75 | Ht 70.0 in | Wt 205.6 lb

## 2017-05-02 DIAGNOSIS — Z1211 Encounter for screening for malignant neoplasm of colon: Secondary | ICD-10-CM | POA: Diagnosis not present

## 2017-05-02 DIAGNOSIS — E7800 Pure hypercholesterolemia, unspecified: Secondary | ICD-10-CM

## 2017-05-02 DIAGNOSIS — E559 Vitamin D deficiency, unspecified: Secondary | ICD-10-CM | POA: Diagnosis not present

## 2017-05-02 DIAGNOSIS — Z0000 Encounter for general adult medical examination without abnormal findings: Secondary | ICD-10-CM | POA: Insufficient documentation

## 2017-05-02 NOTE — Patient Instructions (Signed)
Heart-Healthy Eating Plan Many factors influence your heart health, including eating and exercise habits. Heart (coronary) risk increases with abnormal blood fat (lipid) levels. Heart-healthy meal planning includes limiting unhealthy fats, increasing healthy fats, and making other small dietary changes. This includes maintaining a healthy body weight to help keep lipid levels within a normal range. What is my plan? Your health care provider recommends that you:  Get no more than __25__% of the total calories in your daily diet from fat.  Limit your intake of saturated fat to less than _5__% of your total calories each day.  Limit the amount of cholesterol in your diet to less than __300__ mg per day.  What types of fat should I choose?  Choose healthy fats more often. Choose monounsaturated and polyunsaturated fats, such as olive oil and canola oil, flaxseeds, walnuts, almonds, and seeds.  Eat more omega-3 fats. Good choices include salmon, mackerel, sardines, tuna, flaxseed oil, and ground flaxseeds. Aim to eat fish at least two times each week.  Limit saturated fats. Saturated fats are primarily found in animal products, such as meats, butter, and cream. Plant sources of saturated fats include palm oil, palm kernel oil, and coconut oil.  Avoid foods with partially hydrogenated oils in them. These contain trans fats. Examples of foods that contain trans fats are stick margarine, some tub margarines, cookies, crackers, and other baked goods. What general guidelines do I need to follow?  Check food labels carefully to identify foods with trans fats or high amounts of saturated fat.  Fill one half of your plate with vegetables and green salads. Eat 4-5 servings of vegetables per day. A serving of vegetables equals 1 cup of raw leafy vegetables,  cup of raw or cooked cut-up vegetables, or  cup of vegetable juice.  Fill one fourth of your plate with whole grains. Look for the word "whole" as  the first word in the ingredient list.  Fill one fourth of your plate with lean protein foods.  Eat 4-5 servings of fruit per day. A serving of fruit equals one medium whole fruit,  cup of dried fruit,  cup of fresh, frozen, or canned fruit, or  cup of 100% fruit juice.  Eat more foods that contain soluble fiber. Examples of foods that contain this type of fiber are apples, broccoli, carrots, beans, peas, and barley. Aim to get 20-30 g of fiber per day.  Eat more home-cooked food and less restaurant, buffet, and fast food.  Limit or avoid alcohol.  Limit foods that are high in starch and sugar.  Avoid fried foods.  Cook foods by using methods other than frying. Baking, boiling, grilling, and broiling are all great options. Other fat-reducing suggestions include: ? Removing the skin from poultry. ? Removing all visible fats from meats. ? Skimming the fat off of stews, soups, and gravies before serving them. ? Steaming vegetables in water or broth.  Lose weight if you are overweight. Losing just 5-10% of your initial body weight can help your overall health and prevent diseases such as diabetes and heart disease.  Increase your consumption of nuts, legumes, and seeds to 4-5 servings per week. One serving of dried beans or legumes equals  cup after being cooked, one serving of nuts equals 1 ounces, and one serving of seeds equals  ounce or 1 tablespoon.  You may need to monitor your salt (sodium) intake, especially if you have high blood pressure. Talk with your health care provider or dietitian to get  more information about reducing sodium. What foods can I eat? Grains  Breads, including [place], white, pita, wheat, raisin, rye, oatmeal, and [place]. Tortillas that are neither fried nor made with lard or trans fat. Low-fat rolls, including hotdog and hamburger buns and English muffins. Biscuits. Muffins. Waffles. Pancakes. Light popcorn. Whole-grain cereals. Flatbread. Melba toast.  Pretzels. Breadsticks. Rusks. Low-fat snacks and crackers, including oyster, saltine, matzo, graham, animal, and rye. Rice and pasta, including brown rice and those that are made with whole wheat. Vegetables All vegetables. Fruits All fruits, but limit coconut. Meats and Other Protein Sources Lean, well-trimmed beef, veal, pork, and lamb. Chicken and [place] without skin. All fish and shellfish. Wild duck, rabbit, pheasant, and venison. Egg whites or low-cholesterol egg substitutes. Dried beans, peas, lentils, and tofu.Seeds and most nuts. Dairy Low-fat or nonfat cheeses, including ricotta, string, and mozzarella. Skim or 1% milk that is liquid, powdered, or evaporated. Buttermilk that is made with low-fat milk. Nonfat or low-fat yogurt. Beverages Mineral water. Diet carbonated beverages. Sweets and Desserts Sherbets and fruit ices. Honey, jam, marmalade, jelly, and syrups. Meringues and gelatins. Pure sugar candy, such as hard candy, jelly beans, gumdrops, mints, marshmallows, and small amounts of dark chocolate. [employer] Eat all sweets and desserts in moderation. Fats and Oils Nonhydrogenated (trans-free) margarines. Vegetable oils, including soybean, sesame, sunflower, olive, peanut, safflower, corn, canola, and cottonseed. Salad dressings or mayonnaise that are made with a vegetable oil. Limit added fats and oils that you use for cooking, baking, salads, and as spreads. Other Cocoa powder. Coffee and tea. All seasonings and condiments. The items listed above may not be a complete list of recommended foods or beverages. Contact your dietitian for more options. What foods are not recommended? Grains Breads that are made with saturated or trans fats, oils, or whole milk. Croissants. Butter rolls. Cheese breads. Sweet rolls. Donuts. Buttered popcorn. Chow mein noodles. High-fat crackers, such as cheese or butter crackers. Meats and Other Protein Sources Fatty meats, such as hotdogs,  short ribs, sausage, spareribs, bacon, ribeye roast or steak, and mutton. High-fat deli meats, such as salami and bologna. Caviar. Domestic duck and goose. Organ meats, such as kidney, liver, sweetbreads, brains, gizzard, chitterlings, and heart. Dairy Cream, sour cream, cream cheese, and creamed cottage cheese. Whole milk cheeses, including blue (bleu), Monte[place] Jack, [place], [place], [place], [place], Swiss, [place], [place], and [place]. Whole or 2% milk that is liquid, evaporated, or condensed. Whole buttermilk. Cream sauce or high-fat cheese sauce. Yogurt that is made from whole milk. Beverages Regular sodas and drinks with added sugar. Sweets and Desserts Frosting. Pudding. Cookies. Cakes other than angel food cake. Candy that has milk chocolate or white chocolate, hydrogenated fat, butter, coconut, or unknown ingredients. Buttered syrups. Full-fat ice cream or ice cream drinks. Fats and Oils Gravy that has suet, meat fat, or shortening. Cocoa butter, hydrogenated oils, palm oil, coconut oil, palm kernel oil. These can often be found in baked products, candy, fried foods, nondairy creamers, and whipped toppings. Solid fats and shortenings, including bacon fat, salt pork, lard, and butter. Nondairy cream substitutes, such as coffee creamers and sour cream substitutes. Salad dressings that are made of unknown oils, cheese, or sour cream. The items listed above may not be a complete list of foods and beverages to avoid. Contact your dietitian for more information. This information is not intended to replace advice given to you by your health care provider. Make sure you discuss any questions you have with your health care  provider. Document Released: 06/08/2008 Document Revised: 03/19/2016 Document Reviewed: 02/21/2014 Elsevier Interactive Patient Education  2017 [employer].  We will call you when vit d level results are available. Please schedule full physical with fasting labs (we will  re-check your cholesterol levels) in Dec 2018. We placed colonoscopy order with request to see Dr. Edwards/[facility]. OVERALL YOU ARE DOING A WONDERFUL JOB TAKING CARE OF YOURSELF!

## 2017-05-02 NOTE — Assessment & Plan Note (Signed)
>  40 lb wt loss this year, 17 lb loss since last OV on 01/26/17 Continue heart healthy diet and regular exercise. Recommend re-checking lipids at CPE this winter.

## 2017-05-02 NOTE — Assessment & Plan Note (Signed)
Colonoscopy order placed, she requested to see Dr. Edwards/[facility] Continue excellent hydration, heart healthy diet, and regular exercise. CPE with fasting labs this Dec.

## 2017-05-02 NOTE — Assessment & Plan Note (Signed)
Vit d level re-checked today. She has been taking OTC Vit d 1000 IU daily for >29months.

## 2017-05-02 NOTE — Progress Notes (Signed)
Subjective:    Patient ID: Cindy Rogers, female    DOB: [birth year], 66 y.o.   MRN: [id number]  HPI:  Ms. Belardo is here for f/u: HL and vit d def.  Chol panel checked June 2018 and revealed elevated tot chol and elevated LDL-she declined statin therapy and has continued to loss weight with diet/exercise.  She denies CP/dyspnea/palpitations.  She drinks >gallon water/day, follows heart healthy diet and exercises daily -walking, yoga, boot camp.  She has lost >40 lbs this year and feels "sensational".    She treats her occasional constipation with OTC Miralax and now has had regular BMs, she denies blood in urine/stool.  She needs screening colonoscopy, has been 10 years since last screening.  Patient Care Team    Relationship Specialty Notifications Start End  [name], NP PCP - General Family Medicine  02/02/17   Tanda Rockers, MD Consulting Physician Pulmonary Disease  01/26/17   Berle Mull, MD Consulting Physician Sports Medicine  01/26/17   Sydnee Levans, MD Consulting Physician Dermatology  01/26/17   Darleen Crocker, MD Consulting Physician Ophthalmology  01/26/17     Patient Active Problem List   Diagnosis Date Noted  . Vitamin D deficiency 05/02/2017  . Healthcare maintenance 05/02/2017  . Elevated cholesterol 03/03/2017  . Medicare annual wellness visit, subsequent 03/02/2017  . Chronic idiopathic constipation 01/26/2017  . Bronchitis 01/26/2017  . Osteoporosis of femur without pathological fracture 05/10/2014  . Cough variant asthma 10/13/2013  . Intrinsic asthma 12/10/2011     Past Medical History:  Diagnosis Date  . Asthma   . GERD (gastroesophageal reflux disease)   . Headache(784.0)   . Pneumonia   . PONV (postoperative nausea and vomiting)   . Shortness of breath      Past Surgical History:  Procedure Laterality Date  . ABDOMINAL HYSTERECTOMY    . CHOLECYSTECTOMY    . RHINOPLASTY       Family History  Problem Relation Age of Onset  .  Emphysema Father        smoked  . Heart disease Father   . Kidney cancer Father        with mets to liver and retina  . Heart disease Mother      History  Drug Use No     History  Alcohol Use  . Yes    Comment: glass of wine approx twice per wk     History  Smoking Status  . Never Smoker  Smokeless Tobacco  . Never Used     Outpatient Encounter Prescriptions as of 05/02/2017  Medication Sig  . albuterol (PROAIR HFA) 108 (90 BASE) MCG/ACT inhaler 2 puffs every 4 hours as needed only  if your can't catch your breath  . budesonide-formoterol (SYMBICORT) 80-4.5 MCG/ACT inhaler Inhale 2 puffs into the lungs 2 (two) times daily.  . calcium-vitamin D (OSCAL WITH D) 500-200 MG-UNIT per tablet Take 1 tablet by mouth daily.  . cholecalciferol (VITAMIN D) 1000 UNITS tablet Take 2,000 Units by mouth daily.   Marland Kitchen esomeprazole (NEXIUM) 20 MG capsule Take 1 daily before breakfast  . famotidine (PEPCID) 20 MG tablet Take by mouth one at bedtime  . fluticasone (FLONASE) 50 MCG/ACT nasal spray Place 2 sprays into the nose daily as needed. For allergies  . ibuprofen (ADVIL,MOTRIN) 200 MG tablet Take 600 mg by mouth every 6 (six) hours as needed. For pain  . montelukast (SINGULAIR) 10 MG tablet Take 1 tablet (10 mg total) by  mouth at bedtime.  . psyllium (METAMUCIL) 58.6 % powder Take 1 packet by mouth daily.  . [DISCONTINUED] atorvastatin (LIPITOR) 10 MG tablet Take 1 tablet (10 mg total) by mouth daily.  . [DISCONTINUED] guaiFENesin (MUCINEX) 600 MG 12 hr tablet Take 600 mg by mouth 2 (two) times daily as needed.   No facility-administered encounter medications on file as of 05/02/2017.     Allergies: Food; Codeine; Penicillins; Sulfa antibiotics; and Neosporin af [miconazole]  Body mass index is 29.5 kg/m.  Blood pressure 122/77, pulse 75, height 5\' 10"  (1.778 m), weight 205 lb 9.6 oz (93.3 kg).    Review of Systems  Constitutional: Positive for fatigue. Negative for activity  change, appetite change, chills, diaphoresis, fever and unexpected weight change.  Eyes: Negative for visual disturbance.  Respiratory: Negative for cough, chest tightness, shortness of breath, wheezing and stridor.   Cardiovascular: Negative for chest pain, palpitations and leg swelling.  Gastrointestinal: Negative for abdominal distention, abdominal pain, blood in stool, constipation, diarrhea, nausea and vomiting.  Endocrine: Negative for cold intolerance, heat intolerance, polydipsia, polyphagia and polyuria.  Neurological: Negative for dizziness.  Hematological: Does not bruise/bleed easily.       Objective:   Physical Exam  Constitutional: She is oriented to person, place, and time. She appears well-developed and well-nourished.  HENT:  Head: Normocephalic and atraumatic.  Right Ear: External ear normal.  Left Ear: External ear normal.  Cardiovascular: Normal rate, regular rhythm, normal heart sounds and intact distal pulses.   No murmur heard. Pulmonary/Chest: Effort normal and breath sounds normal. No respiratory distress. She has no wheezes. She has no rales. She exhibits no tenderness.  Neurological: She is alert and oriented to person, place, and time. Coordination normal.  Skin: Skin is warm and dry. No rash noted. She is not diaphoretic. No erythema. No pallor.  Psychiatric: She has a normal mood and affect. Her behavior is normal. Judgment and thought content normal.  Nursing note and vitals reviewed.         Assessment & Plan:   1. Vitamin D deficiency   2. Elevated cholesterol   3. Screening for colon cancer     Vitamin D deficiency Vit d level re-checked today. She has been taking OTC Vit d 1000 IU daily for >82months.  Elevated cholesterol >40 lb wt loss this year, 17 lb loss since last OV on 01/26/17 Continue heart healthy diet and regular exercise. Recommend re-checking lipids at CPE this winter.  Healthcare maintenance Colonoscopy order placed, she  requested to see Dr. Edwards/[facility] Continue excellent hydration, heart healthy diet, and regular exercise. CPE with fasting labs this Dec.    FOLLOW-UP:  No Follow-up on file.

## 2017-05-03 LAB — VITAMIN D 25 HYDROXY (VIT D DEFICIENCY, FRACTURES): Vit D, 25-Hydroxy: 34.6 ng/mL (ref 30.0–100.0)

## 2017-05-10 DIAGNOSIS — H524 Presbyopia: Secondary | ICD-10-CM | POA: Diagnosis not present

## 2017-05-12 DIAGNOSIS — K219 Gastro-esophageal reflux disease without esophagitis: Secondary | ICD-10-CM | POA: Diagnosis not present

## 2017-05-12 DIAGNOSIS — R194 Change in bowel habit: Secondary | ICD-10-CM | POA: Diagnosis not present

## 2017-05-12 DIAGNOSIS — K5900 Constipation, unspecified: Secondary | ICD-10-CM | POA: Diagnosis not present

## 2017-05-12 DIAGNOSIS — Z8371 Family history of colonic polyps: Secondary | ICD-10-CM | POA: Diagnosis not present

## 2017-05-29 IMAGING — CR DG CHEST 2V
2 series · 2 of 2 positions shown · non-contrast
Comparison: Chest x-ray dated 08/27/2013.

CLINICAL DATA: Cough, congestion, chest tightness for 2 weeks.
History of asthma.

EXAM:
CHEST  2 VIEW

[view not recorded (1 of 2)]
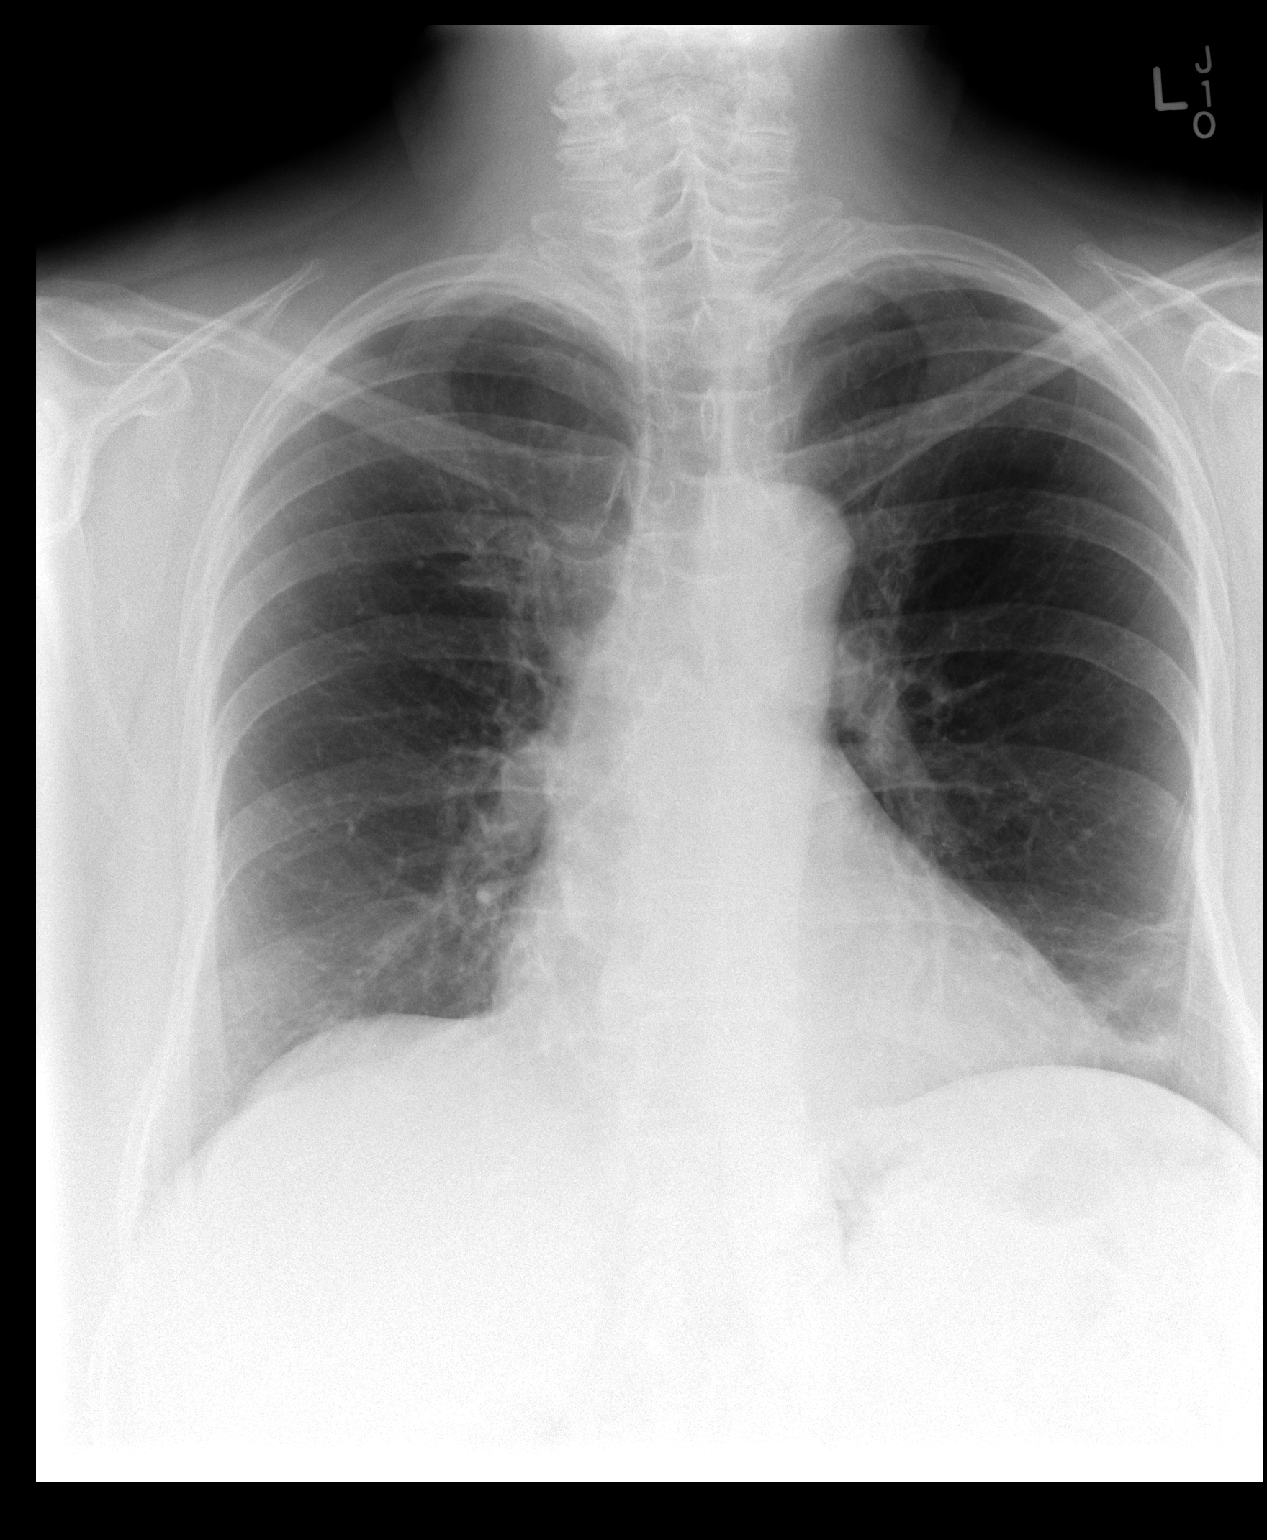

[view not recorded (2 of 2)]
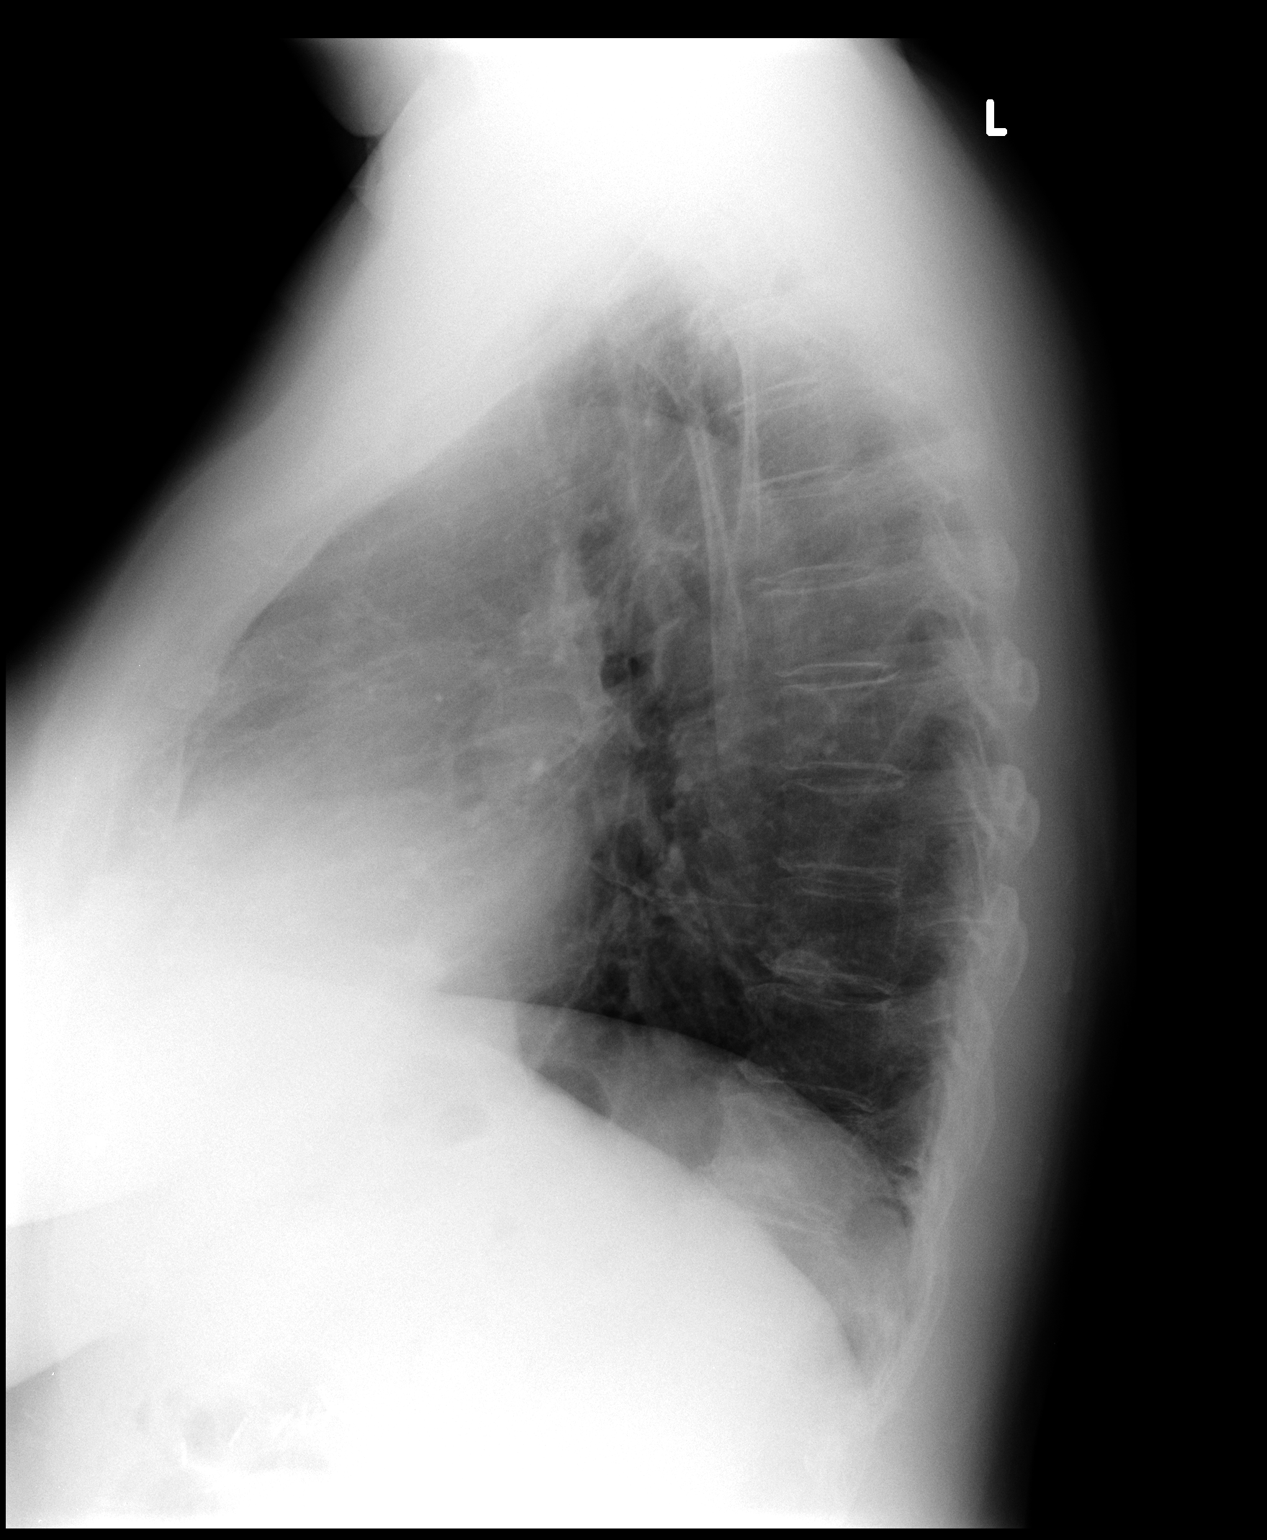

[2 of 2 positions shown; findings below may reference images not displayed]

FINDINGS: Cardiomediastinal silhouette remains normal in size and
configuration. Lungs are clear. No evidence of pneumonia. No pleural
effusion. No pneumothorax. Lung volumes are normal. No acute osseous
abnormality.
IMPRESSION: No evidence of acute cardiopulmonary abnormality. No evidence of
pneumonia. Lung volumes are normal.

## 2017-06-01 DIAGNOSIS — Z1211 Encounter for screening for malignant neoplasm of colon: Secondary | ICD-10-CM | POA: Diagnosis not present

## 2017-06-01 DIAGNOSIS — Z8371 Family history of colonic polyps: Secondary | ICD-10-CM | POA: Diagnosis not present

## 2017-06-01 LAB — HM COLONOSCOPY

## 2017-06-08 DIAGNOSIS — Z1231 Encounter for screening mammogram for malignant neoplasm of breast: Secondary | ICD-10-CM | POA: Diagnosis not present

## 2017-06-08 LAB — HM MAMMOGRAPHY

## 2017-07-19 DIAGNOSIS — H25013 Cortical age-related cataract, bilateral: Secondary | ICD-10-CM | POA: Diagnosis not present

## 2017-07-19 DIAGNOSIS — H2513 Age-related nuclear cataract, bilateral: Secondary | ICD-10-CM | POA: Diagnosis not present

## 2017-07-19 DIAGNOSIS — H43813 Vitreous degeneration, bilateral: Secondary | ICD-10-CM | POA: Diagnosis not present

## 2017-07-19 DIAGNOSIS — H2511 Age-related nuclear cataract, right eye: Secondary | ICD-10-CM | POA: Diagnosis not present

## 2017-08-29 ENCOUNTER — Other Ambulatory Visit (INDEPENDENT_AMBULATORY_CARE_PROVIDER_SITE_OTHER): Payer: PPO

## 2017-08-29 ENCOUNTER — Other Ambulatory Visit: Payer: Self-pay

## 2017-08-29 DIAGNOSIS — E7800 Pure hypercholesterolemia, unspecified: Secondary | ICD-10-CM

## 2017-08-29 DIAGNOSIS — Z0000 Encounter for general adult medical examination without abnormal findings: Secondary | ICD-10-CM | POA: Diagnosis not present

## 2017-08-29 DIAGNOSIS — E559 Vitamin D deficiency, unspecified: Secondary | ICD-10-CM | POA: Diagnosis not present

## 2017-08-29 NOTE — Progress Notes (Signed)
Subjective:    Patient ID: Cindy Rogers, female    DOB: 02-Jul-1951, 66 y.o.   MRN: [id number]  HPI:  05/02/17 OV: Cindy Rogers is here for f/u: HL and vit d def.  Chol panel checked June 2018 and revealed elevated tot chol and elevated LDL-she declined statin therapy and has continued to loss weight with diet/exercise.  She denies CP/dyspnea/palpitations.  She drinks >gallon water/day, follows heart healthy diet and exercises daily -walking, yoga, boot camp.  She has lost >40 lbs this year and feels "sensational".    She treats her occasional constipation with OTC Miralax and now has had regular BMs, she denies blood in urine/stool.  She needs screening colonoscopy, has been 10 years since last screening.   09/01/17: Cindy Rogers is here for CPE. She reports medication compliance and denies SE.  She has an excellent exercise regime- yoga twice a week, boot camp twice a week, yard/house work daily. She has been increasing her water intake and decreasing saturated fat/CHO intake, last LDL 130 on 08/29/17, down from147 on 03/02/17, achieved by [facility], she again declines statin therapy. Healthcare Maintenance: PAP-not indicated Mammogram- 9/18-normal, due for repeat 2020 Colonoscopy- 02/2010- normal, due for repeat 2021 Patient Care Team    Relationship Specialty Notifications Start End  [name], NP PCP - General Family Medicine  02/02/17   Tanda Rockers, MD Consulting Physician Pulmonary Disease  01/26/17   Berle Mull, MD Consulting Physician Sports Medicine  01/26/17   Sydnee Levans, MD Consulting Physician Dermatology  01/26/17   Darleen Crocker, MD Consulting Physician Ophthalmology  01/26/17     Patient Active Problem List   Diagnosis Date Noted  . Vitamin D deficiency 05/02/2017  . Healthcare maintenance 05/02/2017  . Elevated cholesterol 03/03/2017  . Medicare annual wellness visit, subsequent 03/02/2017  . Chronic idiopathic constipation 01/26/2017  . Bronchitis 01/26/2017  .  Osteoporosis of femur without pathological fracture 05/10/2014  . Cough variant asthma 10/13/2013  . Intrinsic asthma 12/10/2011     Past Medical History:  Diagnosis Date  . Asthma   . GERD (gastroesophageal reflux disease)   . Headache(784.0)   . Pneumonia   . PONV (postoperative nausea and vomiting)   . Shortness of breath      Past Surgical History:  Procedure Laterality Date  . ABDOMINAL HYSTERECTOMY    . CHOLECYSTECTOMY    . RHINOPLASTY       Family History  Problem Relation Age of Onset  . Emphysema Father        smoked  . Heart disease Father   . Kidney cancer Father        with mets to liver and retina  . Heart disease Mother      Social History   Substance and Sexual Activity  Drug Use No     Social History   Substance and Sexual Activity  Alcohol Use Yes   Comment: glass of wine approx twice per wk     Social History   Tobacco Use  Smoking Status Never Smoker  Smokeless Tobacco Never Used     Outpatient Encounter Medications as of 09/01/2017  Medication Sig  . albuterol (PROAIR HFA) 108 (90 Base) MCG/ACT inhaler 2 puffs every 4 hours as needed only  if your can't catch your breath  . budesonide-formoterol (SYMBICORT) 80-4.5 MCG/ACT inhaler Inhale 2 puffs into the lungs 2 (two) times daily.  . calcium-vitamin D (OSCAL WITH D) 500-200 MG-UNIT per tablet Take 1 tablet by mouth  daily.  . cholecalciferol (VITAMIN D) 1000 UNITS tablet Take 2,000 Units by mouth daily.   Marland Kitchen esomeprazole (NEXIUM) 20 MG capsule Take 1 daily before breakfast  . famotidine (PEPCID) 20 MG tablet Take by mouth one at bedtime  . fluticasone (FLONASE) 50 MCG/ACT nasal spray Place 2 sprays into the nose daily as needed. For allergies  . ibuprofen (ADVIL,MOTRIN) 200 MG tablet Take 600 mg by mouth every 6 (six) hours as needed. For pain  . montelukast (SINGULAIR) 10 MG tablet Take 1 tablet (10 mg total) by mouth at bedtime.  . psyllium (METAMUCIL) 58.6 % powder Take 1  packet by mouth daily.  . [DISCONTINUED] albuterol (PROAIR HFA) 108 (90 BASE) MCG/ACT inhaler 2 puffs every 4 hours as needed only  if your can't catch your breath  . [DISCONTINUED] budesonide-formoterol (SYMBICORT) 80-4.5 MCG/ACT inhaler Inhale 2 puffs into the lungs 2 (two) times daily.  . [DISCONTINUED] montelukast (SINGULAIR) 10 MG tablet Take 1 tablet (10 mg total) by mouth at bedtime.  . [DISCONTINUED] montelukast (SINGULAIR) 10 MG tablet Take 1 tablet (10 mg total) by mouth at bedtime.   No facility-administered encounter medications on file as of 09/01/2017.     Allergies: Food; Codeine; Penicillins; Sulfa antibiotics; and Neosporin af [miconazole]  Body mass index is 29.41 kg/m.  Blood pressure 130/81, pulse 72, height 5\' 10"  (1.778 m), weight 205 lb (93 kg), SpO2 98 %.    Review of Systems  Constitutional: Positive for fatigue. Negative for activity change, appetite change, chills, diaphoresis, fever and unexpected weight change.  HENT: Negative for congestion.   Eyes: Negative for visual disturbance.  Respiratory: Negative for cough, chest tightness, shortness of breath, wheezing and stridor.   Cardiovascular: Negative for chest pain, palpitations and leg swelling.  Gastrointestinal: Negative for abdominal distention, abdominal pain, blood in stool, constipation, diarrhea, nausea and vomiting.  Endocrine: Negative for cold intolerance, heat intolerance, polydipsia, polyphagia and polyuria.  Genitourinary: Negative for difficulty urinating, flank pain and hematuria.  Musculoskeletal: Negative for arthralgias, back pain, gait problem, joint swelling, myalgias, neck pain and neck stiffness.  Skin: Negative for color change, pallor, rash and wound.  Neurological: Negative for dizziness and headaches.  Hematological: Does not bruise/bleed easily.  Psychiatric/Behavioral: Negative for self-injury, sleep disturbance and suicidal ideas. The patient is not nervous/anxious and is not  hyperactive.        Objective:   Physical Exam  Constitutional: She is oriented to person, place, and time. She appears well-developed and well-nourished.  HENT:  Head: Normocephalic and atraumatic.  Right Ear: Hearing, tympanic membrane, external ear and ear canal normal. Tympanic membrane is not perforated and not bulging. No decreased hearing is noted.  Left Ear: Hearing, tympanic membrane, external ear and ear canal normal. Tympanic membrane is not perforated and not bulging. No decreased hearing is noted.  Nose: Nose normal. Right sinus exhibits no maxillary sinus tenderness and no frontal sinus tenderness. Left sinus exhibits no maxillary sinus tenderness and no frontal sinus tenderness.  Mouth/Throat: Uvula is midline, oropharynx is clear and moist and mucous membranes are normal.  Eyes: Conjunctivae are normal. Pupils are equal, round, and reactive to light.  Neck: Normal range of motion. Neck supple.  Cardiovascular: Normal rate, regular rhythm, normal heart sounds and intact distal pulses.  No murmur heard. Pulmonary/Chest: Effort normal and breath sounds normal. No respiratory distress. She has no decreased breath sounds. She has no wheezes. She has no rhonchi. She has no rales. She exhibits no tenderness. Right breast exhibits  no inverted nipple, no mass, no nipple discharge, no skin change and no tenderness. Left breast exhibits no inverted nipple, no mass, no nipple discharge, no skin change and no tenderness. Breasts are symmetrical.  Abdominal: Soft. Bowel sounds are normal. She exhibits no distension and no mass. There is no tenderness. There is no rebound and no guarding.  Musculoskeletal: Normal range of motion. She exhibits no edema or tenderness.  Lymphadenopathy:    She has no cervical adenopathy.  Neurological: She is alert and oriented to person, place, and time. Coordination normal.  Skin: Skin is warm and dry. No rash noted. She is not diaphoretic. No erythema. No  pallor.  Psychiatric: She has a normal mood and affect. Her behavior is normal. Judgment and thought content normal.  Nursing note and vitals reviewed.         Assessment & Plan:   1. Healthcare maintenance   2. Elevated cholesterol     Elevated cholesterol  last LDL 130 on 08/29/17, down from147 on 03/02/17, achieved by [facility], she again declines statin therapy. She has never used tobacco and does not have hx of MI, CVA, Diabetes Will re-check lipids in one year   Healthcare maintenance You are doing a GREAT JOB taking care of yourself! Excellent reduction in cholesterol, continue to follow heart healthy diet and keep up the exercise. If you need medication refills, please just call clinic. Annual physical with fasting labs, sooner if you need [place].    FOLLOW-UP:  Return in about 1 year (around 09/01/2018) for CPE.

## 2017-08-30 ENCOUNTER — Other Ambulatory Visit: Payer: Self-pay | Admitting: Adult Health

## 2017-08-30 DIAGNOSIS — E7800 Pure hypercholesterolemia, unspecified: Secondary | ICD-10-CM

## 2017-08-30 LAB — COMPREHENSIVE METABOLIC PANEL
A/G RATIO: 2.4 — AB (ref 1.2–2.2)
ALBUMIN: 4.5 g/dL (ref 3.6–4.8)
ALK PHOS: 59 IU/L (ref 39–117)
ALT: 15 IU/L (ref 0–32)
AST: 21 IU/L (ref 0–40)
BILIRUBIN TOTAL: 0.5 mg/dL (ref 0.0–1.2)
BUN / CREAT RATIO: 20 (ref 12–28)
BUN: 17 mg/dL (ref 8–27)
CO2: 25 mmol/L (ref 20–29)
Calcium: 10.3 mg/dL (ref 8.7–10.3)
Chloride: 108 mmol/L — ABNORMAL HIGH (ref 96–106)
Creatinine, Ser: 0.87 mg/dL (ref 0.57–1.00)
GFR calc Af Amer: 80 mL/min/{1.73_m2} (ref >59)
GFR calc non Af Amer: 70 mL/min/{1.73_m2} (ref >59)
GLOBULIN, TOTAL: 1.9 g/dL (ref 1.5–4.5)
Glucose: 84 mg/dL (ref 65–99)
POTASSIUM: 4.6 mmol/L (ref 3.5–5.2)
SODIUM: 145 mmol/L — AB (ref 134–144)
Total Protein: 6.4 g/dL (ref 6.0–8.5)

## 2017-08-30 LAB — CBC WITH DIFFERENTIAL/PLATELET
Basophils Absolute: 0 10*3/uL (ref 0.0–0.2)
Basos: 1 %
EOS (ABSOLUTE): 0.2 10*3/uL (ref 0.0–0.4)
Eos: 4 %
Hematocrit: 40.3 % (ref 34.0–46.6)
Hemoglobin: 13.2 g/dL (ref 11.1–15.9)
IMMATURE GRANULOCYTES: 0 %
Immature Grans (Abs): 0 10*3/uL (ref 0.0–0.1)
LYMPHS ABS: 1.2 10*3/uL (ref 0.7–3.1)
Lymphs: 35 %
MCH: 30 pg (ref 26.6–33.0)
MCHC: 32.8 g/dL (ref 31.5–35.7)
MCV: 92 fL (ref 79–97)
MONOS ABS: 0.3 10*3/uL (ref 0.1–0.9)
Monocytes: 7 %
NEUTROS PCT: 53 %
Neutrophils Absolute: 1.9 10*3/uL (ref 1.4–7.0)
PLATELETS: 185 10*3/uL (ref 150–379)
RBC: 4.4 x10E6/uL (ref 3.77–5.28)
RDW: 13.9 % (ref 12.3–15.4)
WBC: 3.6 10*3/uL (ref 3.4–10.8)

## 2017-08-30 LAB — HEMOGLOBIN A1C
Est. average glucose Bld gHb Est-mCnc: 105 mg/dL
HEMOGLOBIN A1C: 5.3 % (ref 4.8–5.6)

## 2017-08-30 LAB — TSH: TSH: 3.56 u[IU]/mL (ref 0.450–4.500)

## 2017-08-30 LAB — LIPID PANEL
CHOL/HDL RATIO: 3.2 ratio (ref 0.0–4.4)
Cholesterol, Total: 208 mg/dL — ABNORMAL HIGH (ref 100–199)
HDL: 66 mg/dL (ref >39)
LDL CALC: 130 mg/dL — AB (ref 0–99)
Triglycerides: 62 mg/dL (ref 0–149)
VLDL CHOLESTEROL CAL: 12 mg/dL (ref 5–40)

## 2017-08-30 LAB — VITAMIN D 25 HYDROXY (VIT D DEFICIENCY, FRACTURES): Vit D, 25-Hydroxy: 32.9 ng/mL (ref 30.0–100.0)

## 2017-09-01 ENCOUNTER — Other Ambulatory Visit: Payer: Self-pay | Admitting: Adult Health

## 2017-09-01 ENCOUNTER — Encounter: Payer: Self-pay | Admitting: Adult Health

## 2017-09-01 ENCOUNTER — Ambulatory Visit (INDEPENDENT_AMBULATORY_CARE_PROVIDER_SITE_OTHER): Payer: PPO | Admitting: Adult Health

## 2017-09-01 ENCOUNTER — Other Ambulatory Visit: Payer: PPO

## 2017-09-01 VITALS — BP 130/81 | HR 72 | Ht 70.0 in | Wt 205.0 lb

## 2017-09-01 DIAGNOSIS — E7800 Pure hypercholesterolemia, unspecified: Secondary | ICD-10-CM

## 2017-09-01 DIAGNOSIS — M810 Age-related osteoporosis without current pathological fracture: Secondary | ICD-10-CM

## 2017-09-01 DIAGNOSIS — E559 Vitamin D deficiency, unspecified: Secondary | ICD-10-CM

## 2017-09-01 DIAGNOSIS — Z0000 Encounter for general adult medical examination without abnormal findings: Secondary | ICD-10-CM

## 2017-09-01 MED ORDER — MONTELUKAST SODIUM 10 MG PO TABS: 10.0000 mg | ORAL_TABLET | Freq: Every day | ORAL | 3 refills | Status: DC

## 2017-09-01 MED ORDER — MONTELUKAST SODIUM 10 MG PO TABS: 10.0000 mg | ORAL_TABLET | Freq: Every day | ORAL | 0 refills | Status: DC

## 2017-09-01 MED ORDER — BUDESONIDE-FORMOTEROL FUMARATE 80-4.5 MCG/ACT IN AERO: 2.0000 | INHALATION_SPRAY | Freq: Two times a day (BID) | RESPIRATORY_TRACT | 3 refills | Status: DC

## 2017-09-01 MED ORDER — ALBUTEROL SULFATE HFA 108 (90 BASE) MCG/ACT IN AERS: INHALATION_SPRAY | RESPIRATORY_TRACT | 1 refills | Status: DC

## 2017-09-01 NOTE — Assessment & Plan Note (Signed)
You are doing a GREAT JOB taking care of yourself! Excellent reduction in cholesterol, continue to follow heart healthy diet and keep up the exercise. If you need medication refills, please just call clinic. Annual physical with fasting labs, sooner if you need [place].

## 2017-09-01 NOTE — Patient Instructions (Addendum)
Heart-Healthy Eating Plan Many factors influence your heart health, including eating and exercise habits. Heart (coronary) risk increases with abnormal blood fat (lipid) levels. Heart-healthy meal planning includes limiting unhealthy fats, increasing healthy fats, and making other small dietary changes. This includes maintaining a healthy body weight to help keep lipid levels within a normal range. What is my plan? Your health care provider recommends that you:  Get no more than ___25___% of the total calories in your daily diet from fat.  Limit your intake of saturated fat to less than ____5___% of your total calories each day.  Limit the amount of cholesterol in your diet to less than __300___ mg per day.  What types of fat should I choose?  Choose healthy fats more often. Choose monounsaturated and polyunsaturated fats, such as olive oil and canola oil, flaxseeds, walnuts, almonds, and seeds.  Eat more omega-3 fats. Good choices include salmon, mackerel, sardines, tuna, flaxseed oil, and ground flaxseeds. Aim to eat fish at least two times each week.  Limit saturated fats. Saturated fats are primarily found in animal products, such as meats, butter, and cream. Plant sources of saturated fats include palm oil, palm kernel oil, and coconut oil.  Avoid foods with partially hydrogenated oils in them. These contain trans fats. Examples of foods that contain trans fats are stick margarine, some tub margarines, cookies, crackers, and other baked goods. What general guidelines do I need to follow?  Check food labels carefully to identify foods with trans fats or high amounts of saturated fat.  Fill one half of your plate with vegetables and green salads. Eat 4-5 servings of vegetables per day. A serving of vegetables equals 1 cup of raw leafy vegetables,  cup of raw or cooked cut-up vegetables, or  cup of vegetable juice.  Fill one fourth of your plate with whole grains. Look for the word  "whole" as the first word in the ingredient list.  Fill one fourth of your plate with lean protein foods.  Eat 4-5 servings of fruit per day. A serving of fruit equals one medium whole fruit,  cup of dried fruit,  cup of fresh, frozen, or canned fruit, or  cup of 100% fruit juice.  Eat more foods that contain soluble fiber. Examples of foods that contain this type of fiber are apples, broccoli, carrots, beans, peas, and barley. Aim to get 20-30 g of fiber per day.  Eat more home-cooked food and less restaurant, buffet, and fast food.  Limit or avoid alcohol.  Limit foods that are high in starch and sugar.  Avoid fried foods.  Cook foods by using methods other than frying. Baking, boiling, grilling, and broiling are all great options. Other fat-reducing suggestions include: ? Removing the skin from poultry. ? Removing all visible fats from meats. ? Skimming the fat off of stews, soups, and gravies before serving them. ? Steaming vegetables in water or broth.  Lose weight if you are overweight. Losing just 5-10% of your initial body weight can help your overall health and prevent diseases such as diabetes and heart disease.  Increase your consumption of nuts, legumes, and seeds to 4-5 servings per week. One serving of dried beans or legumes equals  cup after being cooked, one serving of nuts equals 1 ounces, and one serving of seeds equals  ounce or 1 tablespoon.  You may need to monitor your salt (sodium) intake, especially if you have high blood pressure. Talk with your health care provider or dietitian to get  more information about reducing sodium. What foods can I eat? Grains  Breads, including [place], white, pita, wheat, raisin, rye, oatmeal, and [place]. Tortillas that are neither fried nor made with lard or trans fat. Low-fat rolls, including hotdog and hamburger buns and English muffins. Biscuits. Muffins. Waffles. Pancakes. Light popcorn. Whole-grain cereals. Flatbread.  Melba toast. Pretzels. Breadsticks. Rusks. Low-fat snacks and crackers, including oyster, saltine, matzo, graham, animal, and rye. Rice and pasta, including brown rice and those that are made with whole wheat. Vegetables All vegetables. Fruits All fruits, but limit coconut. Meats and Other Protein Sources Lean, well-trimmed beef, veal, pork, and lamb. Chicken and [place] without skin. All fish and shellfish. Wild duck, rabbit, pheasant, and venison. Egg whites or low-cholesterol egg substitutes. Dried beans, peas, lentils, and tofu.Seeds and most nuts. Dairy Low-fat or nonfat cheeses, including ricotta, string, and mozzarella. Skim or 1% milk that is liquid, powdered, or evaporated. Buttermilk that is made with low-fat milk. Nonfat or low-fat yogurt. Beverages Mineral water. Diet carbonated beverages. Sweets and Desserts Sherbets and fruit ices. Honey, jam, marmalade, jelly, and syrups. Meringues and gelatins. Pure sugar candy, such as hard candy, jelly beans, gumdrops, mints, marshmallows, and small amounts of dark chocolate. [employer] Eat all sweets and desserts in moderation. Fats and Oils Nonhydrogenated (trans-free) margarines. Vegetable oils, including soybean, sesame, sunflower, olive, peanut, safflower, corn, canola, and cottonseed. Salad dressings or mayonnaise that are made with a vegetable oil. Limit added fats and oils that you use for cooking, baking, salads, and as spreads. Other Cocoa powder. Coffee and tea. All seasonings and condiments. The items listed above may not be a complete list of recommended foods or beverages. Contact your dietitian for more options. What foods are not recommended? Grains Breads that are made with saturated or trans fats, oils, or whole milk. Croissants. Butter rolls. Cheese breads. Sweet rolls. Donuts. Buttered popcorn. Chow mein noodles. High-fat crackers, such as cheese or butter crackers. Meats and Other Protein Sources Fatty meats, such  as hotdogs, short ribs, sausage, spareribs, bacon, ribeye roast or steak, and mutton. High-fat deli meats, such as salami and bologna. Caviar. Domestic duck and goose. Organ meats, such as kidney, liver, sweetbreads, brains, gizzard, chitterlings, and heart. Dairy Cream, sour cream, cream cheese, and creamed cottage cheese. Whole milk cheeses, including blue (bleu), Monte[place] Jack, [place], [place], [place], [place], [place], [place], [place], and [place]. Whole or 2% milk that is liquid, evaporated, or condensed. Whole buttermilk. Cream sauce or high-fat cheese sauce. Yogurt that is made from whole milk. Beverages Regular sodas and drinks with added sugar. Sweets and Desserts Frosting. Pudding. Cookies. Cakes other than angel food cake. Candy that has milk chocolate or white chocolate, hydrogenated fat, butter, coconut, or unknown ingredients. Buttered syrups. Full-fat ice cream or ice cream drinks. Fats and Oils Gravy that has suet, meat fat, or shortening. Cocoa butter, hydrogenated oils, palm oil, coconut oil, palm kernel oil. These can often be found in baked products, candy, fried foods, nondairy creamers, and whipped toppings. Solid fats and shortenings, including bacon fat, salt pork, lard, and butter. Nondairy cream substitutes, such as coffee creamers and sour cream substitutes. Salad dressings that are made of unknown oils, cheese, or sour cream. The items listed above may not be a complete list of foods and beverages to avoid. Contact your dietitian for more information. This information is not intended to replace advice given to you by your health care provider. Make sure you discuss any questions you have with your health care  provider. Document Released: 06/08/2008 Document Revised: 03/19/2016 Document Reviewed: 02/21/2014 Elsevier Interactive Patient Education  2018 [place] are doing a GREAT JOB taking care of yourself! Excellent reduction in cholesterol, continue to follow  heart healthy diet and keep up the exercise. If you need medication refills, please just call clinic. Annual physical with fasting labs, sooner if you need [place]. NICE TO SEE YOU!

## 2017-09-01 NOTE — Assessment & Plan Note (Addendum)
last LDL 130 on 08/29/17, down from147 on 03/02/17, achieved by [facility], she again declines statin therapy. She has never used tobacco and does not have hx of MI, CVA, Diabetes Will re-check lipids in one year

## 2017-09-09 ENCOUNTER — Other Ambulatory Visit: Payer: Self-pay

## 2017-09-09 MED ORDER — BUDESONIDE-FORMOTEROL FUMARATE 80-4.5 MCG/ACT IN AERO: 2.0000 | INHALATION_SPRAY | Freq: Two times a day (BID) | RESPIRATORY_TRACT | 0 refills | Status: DC

## 2017-09-09 MED ORDER — BUDESONIDE-FORMOTEROL FUMARATE 80-4.5 MCG/ACT IN AERO: 2.0000 | INHALATION_SPRAY | Freq: Two times a day (BID) | RESPIRATORY_TRACT | 3 refills | Status: DC

## 2017-09-09 NOTE — Progress Notes (Signed)
Pt presented to office stating that she had not received her shipment for her Symbicort inhaler from the mail order pharmacy.  Reviewed refill previously sent to mail order pharmacy and it was marked as "sample".    Pt stated that she needs 1 inhaler sent to [facility] to cover until she receives mail order shipment.  RX sent to [facility] and 90 day supply RX resent to mail order pharmacy.  Charyl Bigger, CMA

## 2017-09-12 ENCOUNTER — Encounter: Payer: Self-pay | Admitting: Adult Health

## 2017-09-12 ENCOUNTER — Ambulatory Visit (INDEPENDENT_AMBULATORY_CARE_PROVIDER_SITE_OTHER): Payer: PPO | Admitting: Adult Health

## 2017-09-12 VITALS — BP 111/70 | HR 84 | Temp 99.0°F | Ht 70.0 in | Wt 205.2 lb

## 2017-09-12 DIAGNOSIS — H6690 Otitis media, unspecified, unspecified ear: Secondary | ICD-10-CM | POA: Diagnosis not present

## 2017-09-12 DIAGNOSIS — R509 Fever, unspecified: Secondary | ICD-10-CM | POA: Diagnosis not present

## 2017-09-12 DIAGNOSIS — J40 Bronchitis, not specified as acute or chronic: Secondary | ICD-10-CM

## 2017-09-12 MED ORDER — FLUCONAZOLE 150 MG PO TABS: 150.0000 mg | ORAL_TABLET | Freq: Once | ORAL | 0 refills | Status: AC

## 2017-09-12 MED ORDER — AMOXICILLIN-POT CLAVULANATE 875-125 MG PO TABS: 1.0000 | ORAL_TABLET | Freq: Two times a day (BID) | ORAL | 0 refills | Status: DC

## 2017-09-12 MED ORDER — BENZONATATE 100 MG PO CAPS: 200.0000 mg | ORAL_CAPSULE | Freq: Three times a day (TID) | ORAL | 0 refills | Status: DC | PRN

## 2017-09-12 NOTE — Assessment & Plan Note (Signed)
Continue to alternate OTC Acetaminophen and Ibuprofen as needed for pain/fever. If symptoms persist after antibiotic completed, then please call clinic.

## 2017-09-12 NOTE — Patient Instructions (Addendum)
Acute Bronchitis, Adult Acute bronchitis is sudden (acute) swelling of the air tubes (bronchi) in the lungs. Acute bronchitis causes these tubes to fill with mucus, which can make it hard to breathe. It can also cause coughing or wheezing. In adults, acute bronchitis usually goes away within 2 weeks. A cough caused by bronchitis may last up to 3 weeks. Smoking, allergies, and asthma can make the condition worse. Repeated episodes of bronchitis may cause further lung problems, such as chronic obstructive pulmonary disease (COPD). What are the causes? This condition can be caused by germs and by substances that irritate the lungs, including:  Cold and flu viruses. This condition is most often caused by the same virus that causes a cold.  Bacteria.  Exposure to tobacco smoke, dust, fumes, and air pollution.  What increases the risk? This condition is more likely to develop in people who:  Have close contact with someone with acute bronchitis.  Are exposed to lung irritants, such as tobacco smoke, dust, fumes, and vapors.  Have a weak immune system.  Have a respiratory condition such as asthma.  What are the signs or symptoms? Symptoms of this condition include:  A cough.  Coughing up clear, yellow, or green mucus.  Wheezing.  Chest congestion.  Shortness of breath.  A fever.  Body aches.  Chills.  A sore throat.  How is this diagnosed? This condition is usually diagnosed with a physical exam. During the exam, your health care provider may order tests, such as chest X-rays, to rule out other conditions. He or she may also:  Test a sample of your mucus for bacterial infection.  Check the level of oxygen in your blood. This is done to check for pneumonia.  Do a chest X-ray or lung function testing to rule out pneumonia and other conditions.  Perform blood tests.  Your health care provider will also ask about your symptoms and medical history. How is this  treated? Most cases of acute bronchitis clear up over time without treatment. Your health care provider may recommend:  Drinking more fluids. Drinking more makes your mucus thinner, which may make it easier to breathe.  Taking a medicine for a fever or cough.  Taking an antibiotic medicine.  Using an inhaler to help improve shortness of breath and to control a cough.  Using a cool mist vaporizer or humidifier to make it easier to breathe.  Follow these instructions at home: Medicines  Take over-the-counter and prescription medicines only as told by your health care provider.  If you were prescribed an antibiotic, take it as told by your health care provider. Do not stop taking the antibiotic even if you start to feel better. General instructions  Get plenty of rest.  Drink enough fluids to keep your urine clear or pale yellow.  Avoid smoking and secondhand smoke. Exposure to cigarette smoke or irritating chemicals will make bronchitis worse. If you smoke and you need help quitting, ask your health care provider. Quitting smoking will help your lungs heal faster.  Use an inhaler, cool mist vaporizer, or humidifier as told by your health care provider.  Keep all follow-up visits as told by your health care provider. This is important. How is this prevented? To lower your risk of getting this condition again:  Wash your hands often with soap and water. If soap and water are not available, use hand sanitizer.  Avoid contact with people who have cold symptoms.  Try not to touch your hands to your   mouth, nose, or eyes.  Make sure to get the flu shot every year.  Contact a health care provider if:  Your symptoms do not improve in 2 weeks of treatment. Get help right away if:  You cough up blood.  You have chest pain.  You have severe shortness of breath.  You become dehydrated.  You faint or keep feeling like you are going to faint.  You keep vomiting.  You have a  severe headache.  Your fever or chills gets worse. This information is not intended to replace advice given to you by your health care provider. Make sure you discuss any questions you have with your health care provider. Document Released: 10/07/2004 Document Revised: 03/24/2016 Document Reviewed: 02/18/2016 Elsevier Interactive Patient Education  2018 [employer].   Otitis Media, Adult Otitis media occurs when there is inflammation and fluid in the middle ear. Your middle ear is a part of the ear that contains bones for hearing as well as air that helps send sounds to your brain. What are the causes? This condition is caused by a blockage in the eustachian tube. This tube drains fluid from the ear to the back of the nose (nasopharynx). A blockage in this tube can be caused by an object or by swelling (edema) in the tube. Problems that can cause a blockage include: A cold or other upper respiratory infection. Allergies. An irritant, such as tobacco smoke. Enlarged adenoids. The adenoids are areas of soft tissue located high in the back of the throat, behind the nose and the roof of the mouth. A mass in the nasopharynx. Damage to the ear caused by pressure changes (barotrauma).  What are the signs or symptoms? Symptoms of this condition include: Ear pain. A fever. Decreased hearing. A headache. Tiredness (lethargy). Fluid leaking from the ear. Ringing in the ear.  How is this diagnosed? This condition is diagnosed with a physical exam. During the exam your health care provider will use an instrument called an otoscope to look into your ear and check for redness, swelling, and fluid. He or she will also ask about your symptoms. Your health care provider may also order tests, such as: A test to check the movement of the eardrum (pneumatic otoscopy). This test is done by squeezing a small amount of air into the ear. A test that changes air pressure in the middle ear to check how well  the eardrum moves and whether the eustachian tube is working (tympanogram).  How is this treated? This condition usually goes away on its own within 3-5 days. But if the condition is caused by a bacteria infection and does not go away own its own, or keeps coming back, your health care provider may: Prescribe antibiotic medicines to treat the infection. Prescribe or recommend medicines to control pain.  Follow these instructions at home: Take over-the-counter and prescription medicines only as told by your health care provider. If you were prescribed an antibiotic medicine, take it as told by your health care provider. Do not stop taking the antibiotic even if you start to feel better. Keep all follow-up visits as told by your health care provider. This is important. Contact a health care provider if: You have bleeding from your nose. There is a lump on your neck. You are not getting better in 5 days. You feel worse instead of better. Get help right away if: You have severe pain that is not controlled with medicine. You have swelling, redness, or pain around your  ear. You have stiffness in your neck. A part of your face is paralyzed. The bone behind your ear (mastoid) is tender when you touch it. You develop a severe headache. Summary Otitis media is redness, soreness, and swelling of the middle ear. This condition usually goes away on its own within 3-5 days. If the problem does not go away in 3-5 days, your health care provider may prescribe or recommend medicines to treat your symptoms. If you were prescribed an antibiotic medicine, take it as told by your health care provider. This information is not intended to replace advice given to you by your health care provider. Make sure you discuss any questions you have with your health care provider. Document Released: 06/04/2004 Document Revised: 08/20/2016 Document Reviewed: 08/20/2016 Elsevier Interactive Patient Education  [employer].  Please take medications as directed. Since you have successfully taken penicillin in the last few years, will send in Augmentin. If you notice any reactions to antibiotic- stop medication, take OTC Benadryl, and call us. Diflucan only is yeast infection symptoms begin. Increase fluids/rest/vit c- 2000mg /day when not feeling well to boost the immune system. Continue to alternate OTC Acetaminophen and Ibuprofen as needed for pain/fever. If symptoms persist after antibiotic completed, then please call clinic. FEEL BETTER!

## 2017-09-12 NOTE — Progress Notes (Signed)
Subjective:    Patient ID: Genia Hotter, female    DOB: 1950/12/31, 66 y.o.   MRN: [id number]  HPI:  Ms. Callari is here for productive cough (thick/green mucus), nasal drainage (thick/yellow), sore throat (2/10), frontal HA (3/10), chills/night sweats, fever, ear pain (4/10), fatigue, and sinus pressure that all began >1 week ago. She has been pushing fluids and using OTC Acetaminophen and Ibuprofen, last dose of Acetaminophen was this am at 0600 She denies CP/dyspnea at rest/N/V/D She reports that her husband has also had productive cough for last week.  Patient Care Team    Relationship Specialty Notifications Start End  [name], NP PCP - General Family Medicine  02/02/17   Tanda Rockers, MD Consulting Physician Pulmonary Disease  01/26/17   Berle Mull, MD Consulting Physician Sports Medicine  01/26/17   Sydnee Levans, MD Consulting Physician Dermatology  01/26/17   Darleen Crocker, MD Consulting Physician Ophthalmology  01/26/17     Patient Active Problem List   Diagnosis Date Noted  . Vitamin D deficiency 05/02/2017  . Healthcare maintenance 05/02/2017  . Elevated cholesterol 03/03/2017  . Medicare annual wellness visit, subsequent 03/02/2017  . Chronic idiopathic constipation 01/26/2017  . Bronchitis 01/26/2017  . Osteoporosis of femur without pathological fracture 05/10/2014  . Cough variant asthma 10/13/2013  . Intrinsic asthma 12/10/2011     Past Medical History:  Diagnosis Date  . Asthma   . GERD (gastroesophageal reflux disease)   . Headache(784.0)   . Pneumonia   . PONV (postoperative nausea and vomiting)   . Shortness of breath      Past Surgical History:  Procedure Laterality Date  . ABDOMINAL HYSTERECTOMY    . CHOLECYSTECTOMY    . RHINOPLASTY       Family History  Problem Relation Age of Onset  . Emphysema Father        smoked  . Heart disease Father   . Kidney cancer Father        with mets to liver and retina  . Heart disease  Mother      Social History   Substance and Sexual Activity  Drug Use No     Social History   Substance and Sexual Activity  Alcohol Use Yes   Comment: glass of wine approx twice per wk     Social History   Tobacco Use  Smoking Status Never Smoker  Smokeless Tobacco Never Used     Outpatient Encounter Medications as of 09/12/2017  Medication Sig  . albuterol (PROAIR HFA) 108 (90 Base) MCG/ACT inhaler 2 puffs every 4 hours as needed only  if your can't catch your breath  . budesonide-formoterol (SYMBICORT) 80-4.5 MCG/ACT inhaler Inhale 2 puffs into the lungs 2 (two) times daily.  . calcium-vitamin D (OSCAL WITH D) 500-200 MG-UNIT per tablet Take 1 tablet by mouth daily.  . cholecalciferol (VITAMIN D) 1000 UNITS tablet Take 2,000 Units by mouth daily.   Marland Kitchen esomeprazole (NEXIUM) 20 MG capsule Take 1 daily before breakfast  . famotidine (PEPCID) 20 MG tablet Take by mouth one at bedtime  . fluticasone (FLONASE) 50 MCG/ACT nasal spray Place 2 sprays into the nose daily as needed. For allergies  . ibuprofen (ADVIL,MOTRIN) 200 MG tablet Take 600 mg by mouth every 6 (six) hours as needed. For pain  . montelukast (SINGULAIR) 10 MG tablet Take 1 tablet (10 mg total) by mouth at bedtime.  . psyllium (METAMUCIL) 58.6 % powder Take 1 packet by mouth daily.  No facility-administered encounter medications on file as of 09/12/2017.     Allergies: Food; Codeine; Penicillins; Sulfa antibiotics; and Neosporin af [miconazole]  Body mass index is 29.44 kg/m.  Blood pressure 111/70, pulse 84, temperature 99 F (37.2 C), temperature source Oral, height 5\' 10"  (1.778 m), weight 205 lb 3.2 oz (93.1 kg), SpO2 95 %.     Review of Systems  Constitutional: Positive for activity change, chills, fatigue and fever. Negative for appetite change, diaphoresis and unexpected weight change.  HENT: Positive for congestion, ear pain, postnasal drip, rhinorrhea, sinus pressure, sinus pain, sore  throat and voice change. Negative for trouble swallowing.   Eyes: Negative for visual disturbance.  Respiratory: Positive for cough and shortness of breath. Negative for chest tightness, wheezing and stridor.   Cardiovascular: Negative for chest pain, palpitations and leg swelling.  Gastrointestinal: Negative for abdominal distention, abdominal pain, blood in stool, constipation, diarrhea, nausea and vomiting.  Endocrine: Negative for cold intolerance, heat intolerance, polydipsia, polyphagia and polyuria.  Genitourinary: Negative for difficulty urinating and flank pain.  Neurological: Positive for headaches. Negative for dizziness.  Psychiatric/Behavioral: Positive for sleep disturbance.       Objective:   Physical Exam  Constitutional: She is oriented to person, place, and time. She appears well-developed and well-nourished. No distress.  HENT:  Head: Normocephalic and atraumatic.  Right Ear: Hearing, external ear and ear canal normal. Tympanic membrane is erythematous and bulging. No decreased hearing is noted.  Left Ear: Hearing, external ear and ear canal normal. Tympanic membrane is erythematous and bulging. No decreased hearing is noted.  Nose: Mucosal edema and rhinorrhea present. Right sinus exhibits maxillary sinus tenderness and frontal sinus tenderness. Left sinus exhibits maxillary sinus tenderness and frontal sinus tenderness.  Mouth/Throat: Uvula is midline and mucous membranes are normal. Posterior oropharyngeal edema and posterior oropharyngeal erythema present. No oropharyngeal exudate or tonsillar abscesses.  Eyes: Conjunctivae are normal. Pupils are equal, round, and reactive to light.  Neck: Normal range of motion. Neck supple.  Cardiovascular: Normal rate, regular rhythm, normal heart sounds and intact distal pulses.  No murmur heard. Pulmonary/Chest: Effort normal and breath sounds normal. No respiratory distress. She has no wheezes. She has no rales. She exhibits no  tenderness.  Lymphadenopathy:    She has no cervical adenopathy.  Neurological: She is alert and oriented to person, place, and time.  Skin: Skin is warm and dry. No rash noted. She is not diaphoretic. No erythema. No pallor.  Psychiatric: She has a normal mood and affect. Her behavior is normal. Judgment and thought content normal.  Nursing note and vitals reviewed.         Assessment & Plan:   1. Bronchitis   2. Fever, unspecified fever cause   3. Acute otitis media, unspecified otitis media type     Bronchitis Please take medications as directed. Since you have successfully taken penicillin in the last few years, will send in Augmentin. If you notice any reactions to antibiotic- stop medication, take OTC Benadryl, and call us. Diflucan only is yeast infection symptoms begin. Increase fluids/rest/vit c- 2000mg /day when not feeling well to boost the immune system. Continue to alternate OTC Acetaminophen and Ibuprofen as needed for pain/fever. If symptoms persist after antibiotic completed, then please call clinic.  Acute otitis media Please take medications as directed. Since you have successfully taken penicillin in the last few years, will send in Augmentin. If you notice any reactions to antibiotic- stop medication, take OTC Benadryl, and call us. Diflucan  only is yeast infection symptoms begin. Increase fluids/rest/vit c- 2000mg /day when not feeling well to boost the immune system. Continue to alternate OTC Acetaminophen and Ibuprofen as needed for pain/fever. If symptoms persist after antibiotic completed, then please call clinic.  Fever Continue to alternate OTC Acetaminophen and Ibuprofen as needed for pain/fever. If symptoms persist after antibiotic completed, then please call clinic.    FOLLOW-UP:  Return if symptoms worsen or fail to improve.

## 2017-09-12 NOTE — Assessment & Plan Note (Signed)
Please take medications as directed. Since you have successfully taken penicillin in the last few years, will send in Augmentin. If you notice any reactions to antibiotic- stop medication, take OTC Benadryl, and call us. Diflucan only is yeast infection symptoms begin. Increase fluids/rest/vit c- 2000mg /day when not feeling well to boost the immune system. Continue to alternate OTC Acetaminophen and Ibuprofen as needed for pain/fever. If symptoms persist after antibiotic completed, then please call clinic.

## 2017-09-26 DIAGNOSIS — H2511 Age-related nuclear cataract, right eye: Secondary | ICD-10-CM | POA: Diagnosis not present

## 2017-09-27 DIAGNOSIS — H2512 Age-related nuclear cataract, left eye: Secondary | ICD-10-CM | POA: Diagnosis not present

## 2017-10-14 DIAGNOSIS — H2512 Age-related nuclear cataract, left eye: Secondary | ICD-10-CM | POA: Diagnosis not present

## 2018-06-12 DIAGNOSIS — Z1231 Encounter for screening mammogram for malignant neoplasm of breast: Secondary | ICD-10-CM | POA: Diagnosis not present

## 2018-06-12 LAB — HM MAMMOGRAPHY

## 2018-06-19 DIAGNOSIS — M8588 Other specified disorders of bone density and structure, other site: Secondary | ICD-10-CM | POA: Diagnosis not present

## 2018-06-19 LAB — HM DEXA SCAN

## 2018-06-28 DIAGNOSIS — S83281D Other tear of lateral meniscus, current injury, right knee, subsequent encounter: Secondary | ICD-10-CM | POA: Diagnosis not present

## 2018-06-28 DIAGNOSIS — M542 Cervicalgia: Secondary | ICD-10-CM | POA: Diagnosis not present

## 2018-06-28 DIAGNOSIS — M25512 Pain in left shoulder: Secondary | ICD-10-CM | POA: Diagnosis not present

## 2018-08-14 ENCOUNTER — Ambulatory Visit (INDEPENDENT_AMBULATORY_CARE_PROVIDER_SITE_OTHER): Payer: PPO | Admitting: Adult Health

## 2018-08-14 ENCOUNTER — Encounter: Payer: Self-pay | Admitting: Adult Health

## 2018-08-14 VITALS — BP 117/74 | HR 74 | Temp 99.5°F | Ht 70.0 in | Wt 210.9 lb

## 2018-08-14 DIAGNOSIS — J40 Bronchitis, not specified as acute or chronic: Secondary | ICD-10-CM

## 2018-08-14 MED ORDER — BENZONATATE 100 MG PO CAPS: 200.0000 mg | ORAL_CAPSULE | Freq: Three times a day (TID) | ORAL | 0 refills | Status: DC | PRN

## 2018-08-14 MED ORDER — ALBUTEROL SULFATE HFA 108 (90 BASE) MCG/ACT IN AERS: INHALATION_SPRAY | RESPIRATORY_TRACT | 1 refills | Status: DC

## 2018-08-14 MED ORDER — AMOXICILLIN-POT CLAVULANATE 875-125 MG PO TABS: 1.0000 | ORAL_TABLET | Freq: Two times a day (BID) | ORAL | 0 refills | Status: DC

## 2018-08-14 MED ORDER — PREDNISONE 20 MG PO TABS: ORAL_TABLET | ORAL | 0 refills | Status: DC

## 2018-08-14 NOTE — Progress Notes (Signed)
Subjective:    Patient ID: Cindy Rogers, female    DOB: Nov 05, 1950, 67 y.o.   MRN: [id number]  HPI:  Ms. Borland presents with productive cough (green mucus), nasal drainage (yellow), facial pressure, ear pressure, sore throat (3/10), extreme fatigue, and low-grade fever (highest 99.5f)- all starred >1.5 weeks ago. She has been taking OTC Sudafed, OTC Mucinex and pushing fluids- last round of medication was bedtime last night Temp this morning 99.49f She denies CP/dyspnea at rest/palpitations/N/V/D She denies N/V/D/abdominal pain She denies tobacco/vape use    Patient Care Team    Relationship Specialty Notifications Start End  [name], NP PCP - General Family Medicine  02/02/17   Tanda Rockers, MD Consulting Physician Pulmonary Disease  01/26/17   Berle Mull, MD Consulting Physician Sports Medicine  01/26/17   Sydnee Levans, MD Consulting Physician Dermatology  01/26/17   Darleen Crocker, MD Consulting Physician Ophthalmology  01/26/17     Patient Active Problem List   Diagnosis Date Noted  . Fever 09/12/2017  . Acute otitis media 09/12/2017  . Vitamin D deficiency 05/02/2017  . Healthcare maintenance 05/02/2017  . Elevated cholesterol 03/03/2017  . Medicare annual wellness visit, subsequent 03/02/2017  . Chronic idiopathic constipation 01/26/2017  . Bronchitis 01/26/2017  . Osteoporosis of femur without pathological fracture 05/10/2014  . Cough variant asthma 10/13/2013  . Intrinsic asthma 12/10/2011     Past Medical History:  Diagnosis Date  . Asthma   . GERD (gastroesophageal reflux disease)   . Headache(784.0)   . Pneumonia   . PONV (postoperative nausea and vomiting)   . Shortness of breath      Past Surgical History:  Procedure Laterality Date  . ABDOMINAL HYSTERECTOMY    . CHOLECYSTECTOMY    . RHINOPLASTY       Family History  Problem Relation Age of Onset  . Emphysema Father        smoked  . Heart disease Father   . Kidney cancer  Father        with mets to liver and retina  . Heart disease Mother      Social History   Substance and Sexual Activity  Drug Use No     Social History   Substance and Sexual Activity  Alcohol Use Yes   Comment: glass of wine approx twice per wk     Social History   Tobacco Use  Smoking Status Never Smoker  Smokeless Tobacco Never Used     Outpatient Encounter Medications as of 08/14/2018  Medication Sig  . albuterol (PROAIR HFA) 108 (90 Base) MCG/ACT inhaler 2 puffs every 4 hours as needed only  if your can't catch your breath  . budesonide-formoterol (SYMBICORT) 80-4.5 MCG/ACT inhaler Inhale 2 puffs into the lungs 2 (two) times daily.  . calcium-vitamin D (OSCAL WITH D) 500-200 MG-UNIT per tablet Take 1 tablet by mouth daily.  . cholecalciferol (VITAMIN D) 1000 UNITS tablet Take 2,000 Units by mouth daily.   . famotidine (PEPCID) 20 MG tablet Take by mouth one at bedtime  . fluticasone (FLONASE) 50 MCG/ACT nasal spray Place 2 sprays into the nose daily as needed. For allergies  . ibuprofen (ADVIL,MOTRIN) 200 MG tablet Take 600 mg by mouth every 6 (six) hours as needed. For pain  . montelukast (SINGULAIR) 10 MG tablet Take 1 tablet (10 mg total) by mouth at bedtime.  . [DISCONTINUED] albuterol (PROAIR HFA) 108 (90 Base) MCG/ACT inhaler 2 puffs every 4 hours as needed only  if your can't catch your breath  . amoxicillin-clavulanate (AUGMENTIN) 875-125 MG tablet Take 1 tablet by mouth 2 (two) times daily.  . benzonatate (TESSALON PERLES) 100 MG capsule Take 2 capsules (200 mg total) by mouth 3 (three) times daily as needed for cough.  . predniSONE (DELTASONE) 20 MG tablet 1 tab every 12 hrs for 3 days, then 1 tab daily for 3 days  . [DISCONTINUED] amoxicillin-clavulanate (AUGMENTIN) 875-125 MG tablet Take 1 tablet by mouth 2 (two) times daily.  . [DISCONTINUED] benzonatate (TESSALON PERLES) 100 MG capsule Take 2 capsules (200 mg total) by mouth 3 (three) times daily as  needed for cough.  . [DISCONTINUED] esomeprazole (NEXIUM) 20 MG capsule Take 1 daily before breakfast  . [DISCONTINUED] psyllium (METAMUCIL) 58.6 % powder Take 1 packet by mouth daily.   No facility-administered encounter medications on file as of 08/14/2018.     Allergies: Food; Codeine; Penicillins; Sulfa antibiotics; and Neosporin af [miconazole]  Body mass index is 30.26 kg/m.  Blood pressure 117/74, pulse 74, temperature 99.5 F (37.5 C), temperature source Oral, height 5\' 10"  (1.778 m), weight 210 lb 14.4 oz (95.7 kg), SpO2 96 %.  Review of Systems  Constitutional: Positive for activity change and fatigue. Negative for appetite change, chills, diaphoresis, fever and unexpected weight change.  HENT: Positive for congestion, ear pain, facial swelling, postnasal drip, rhinorrhea, sinus pressure, sinus pain, sore throat and voice change.   Eyes: Negative for visual disturbance.  Respiratory: Positive for cough. Negative for chest tightness, shortness of breath and wheezing.   Cardiovascular: Negative for chest pain, palpitations and leg swelling.  Gastrointestinal: Negative for abdominal pain, blood in stool, constipation, diarrhea, nausea, rectal pain and vomiting.  Genitourinary: Negative for dysuria.  Neurological: Negative for dizziness and headaches.  Hematological: Does not bruise/bleed easily.  Psychiatric/Behavioral: Positive for sleep disturbance. Negative for behavioral problems, confusion, decreased concentration, dysphoric mood, hallucinations, self-injury and suicidal ideas. The patient is not nervous/anxious and is not hyperactive.        Objective:   Physical Exam  Constitutional: She is oriented to person, place, and time. She appears well-developed and well-nourished. She appears ill.  HENT:  Head: Normocephalic and atraumatic.  Right Ear: External ear normal. Tympanic membrane is bulging. Tympanic membrane is not erythematous.  Left Ear: External ear normal.  Tympanic membrane is bulging. Tympanic membrane is not erythematous.  Nose: Mucosal edema and rhinorrhea present. Right sinus exhibits no maxillary sinus tenderness and no frontal sinus tenderness. Left sinus exhibits no maxillary sinus tenderness and no frontal sinus tenderness.  Mouth/Throat: Uvula is midline and mucous membranes are normal. Posterior oropharyngeal edema and posterior oropharyngeal erythema present. Tonsils are 0 on the right. Tonsils are 0 on the left. No tonsillar exudate.  Eyes: Pupils are equal, round, and reactive to light. Conjunctivae and EOM are normal.  Neck: Normal range of motion. Neck supple.  Cardiovascular: Normal rate, regular rhythm, normal heart sounds and intact distal pulses.  No murmur heard. Pulmonary/Chest: Effort normal. No stridor. No respiratory distress. She has wheezes in the right upper field and the left upper field. She has no rales. She exhibits no tenderness.  Very mild wheezing noted in upper lobes   Lymphadenopathy:    She has no cervical adenopathy.  Neurological: She is alert and oriented to person, place, and time.  Skin: Skin is warm and dry. Capillary refill takes less than 2 seconds. No rash noted. No erythema. No pallor.  Psychiatric: She has a normal mood and affect.  Her behavior is normal. Judgment and thought content normal.  Nursing note and vitals reviewed.     Assessment & Plan:   1. Bronchitis     Bronchitis Please take Augmentin and Prednisone as directed. Please take ProAir and Tessalon as needed. Increase fluids, rest, and vit c-2,000mg /day If symptoms persist after antibiotic completed, then call clinic.   FOLLOW-UP:  Return if symptoms worsen or fail to improve.

## 2018-08-14 NOTE — Assessment & Plan Note (Signed)
Please take Augmentin and Prednisone as directed. Please take ProAir and Tessalon as needed. Increase fluids, rest, and vit c-2,000mg /day If symptoms persist after antibiotic completed, then call clinic.

## 2018-08-14 NOTE — Patient Instructions (Signed)

## 2018-08-29 ENCOUNTER — Other Ambulatory Visit: Payer: PPO

## 2018-08-29 ENCOUNTER — Other Ambulatory Visit: Payer: Self-pay

## 2018-08-29 DIAGNOSIS — Z0000 Encounter for general adult medical examination without abnormal findings: Secondary | ICD-10-CM

## 2018-08-29 DIAGNOSIS — E7800 Pure hypercholesterolemia, unspecified: Secondary | ICD-10-CM

## 2018-08-29 DIAGNOSIS — R7989 Other specified abnormal findings of blood chemistry: Secondary | ICD-10-CM | POA: Diagnosis not present

## 2018-08-30 DIAGNOSIS — R7989 Other specified abnormal findings of blood chemistry: Secondary | ICD-10-CM | POA: Insufficient documentation

## 2018-08-30 LAB — COMPREHENSIVE METABOLIC PANEL
ALT: 19 IU/L (ref 0–32)
AST: 17 IU/L (ref 0–40)
Albumin/Globulin Ratio: 2.3 — ABNORMAL HIGH (ref 1.2–2.2)
Albumin: 4.3 g/dL (ref 3.6–4.8)
Alkaline Phosphatase: 61 IU/L (ref 39–117)
BUN/Creatinine Ratio: 22 (ref 12–28)
BUN: 19 mg/dL (ref 8–27)
Bilirubin Total: 0.5 mg/dL (ref 0.0–1.2)
CO2: 24 mmol/L (ref 20–29)
Calcium: 10.5 mg/dL — ABNORMAL HIGH (ref 8.7–10.3)
Chloride: 106 mmol/L (ref 96–106)
Creatinine, Ser: 0.85 mg/dL (ref 0.57–1.00)
GFR calc Af Amer: 82 mL/min/{1.73_m2} (ref >59)
GFR calc non Af Amer: 71 mL/min/{1.73_m2} (ref >59)
Globulin, Total: 1.9 g/dL (ref 1.5–4.5)
Glucose: 84 mg/dL (ref 65–99)
Potassium: 4 mmol/L (ref 3.5–5.2)
Sodium: 144 mmol/L (ref 134–144)
Total Protein: 6.2 g/dL (ref 6.0–8.5)

## 2018-08-30 LAB — HEMOGLOBIN A1C
Est. average glucose Bld gHb Est-mCnc: 108 mg/dL
Hgb A1c MFr Bld: 5.4 % (ref 4.8–5.6)

## 2018-08-30 LAB — CBC WITH DIFFERENTIAL/PLATELET
Basophils Absolute: 0.1 10*3/uL (ref 0.0–0.2)
Basos: 1 %
EOS (ABSOLUTE): 0.2 10*3/uL (ref 0.0–0.4)
Eos: 3 %
Hematocrit: 40.1 % (ref 34.0–46.6)
Hemoglobin: 13.2 g/dL (ref 11.1–15.9)
IMMATURE GRANULOCYTES: 0 %
Immature Grans (Abs): 0 10*3/uL (ref 0.0–0.1)
Lymphocytes Absolute: 1.5 10*3/uL (ref 0.7–3.1)
Lymphs: 30 %
MCH: 31.4 pg (ref 26.6–33.0)
MCHC: 32.9 g/dL (ref 31.5–35.7)
MCV: 95 fL (ref 79–97)
Monocytes Absolute: 0.5 10*3/uL (ref 0.1–0.9)
Monocytes: 11 %
NEUTROS PCT: 55 %
Neutrophils Absolute: 2.7 10*3/uL (ref 1.4–7.0)
Platelets: 181 10*3/uL (ref 150–450)
RBC: 4.21 x10E6/uL (ref 3.77–5.28)
RDW: 12.8 % (ref 12.3–15.4)
WBC: 5 10*3/uL (ref 3.4–10.8)

## 2018-08-30 LAB — LIPID PANEL
Chol/HDL Ratio: 3.6 ratio (ref 0.0–4.4)
Cholesterol, Total: 231 mg/dL — ABNORMAL HIGH (ref 100–199)
HDL: 64 mg/dL (ref >39)
LDL Calculated: 147 mg/dL — ABNORMAL HIGH (ref 0–99)
Triglycerides: 98 mg/dL (ref 0–149)
VLDL Cholesterol Cal: 20 mg/dL (ref 5–40)

## 2018-08-30 LAB — TSH: TSH: 4.8 u[IU]/mL — ABNORMAL HIGH (ref 0.450–4.500)

## 2018-09-01 LAB — SPECIMEN STATUS REPORT

## 2018-09-01 LAB — T4, FREE: Free T4: 1.09 ng/dL (ref 0.82–1.77)

## 2018-09-01 LAB — T3: T3, Total: 105 ng/dL (ref 71–180)

## 2018-09-04 ENCOUNTER — Other Ambulatory Visit: Payer: Self-pay | Admitting: Adult Health

## 2018-09-06 NOTE — Progress Notes (Signed)
Subjective:    Patient ID: Cindy Rogers, female    DOB: 1951/05/29, 67 y.o.   MRN: [id number]  HPI:  [name] is here for CPE She continues to exercise regularly- [facility] 4 days/week She drinks >80 oz water/day and continues to abstain from tobacco/vape use She reports recent 10 lb weight gain due to "holiday eating".  Reviewed recent labs- A1c- 5.4 The 10-year ASCVD risk score [name], et al., 2013) is: 4.8%   Values used to calculate the score:     Age: 68 years     Sex: Female     Is Non-Hispanic African American: No     Diabetic: No     Tobacco smoker: No     Systolic Blood Pressure: 458 mmHg     Is BP treated: No     HDL Cholesterol: 64 mg/dL     Total Cholesterol: 231 mg/dL  LDL-147  Healthcare Maintenance: PAP-N/A hysterectomy Mammogram-05/2018- normal Immunizations-[facility] Screening-N/A LDCT-N/A DEXA- 10/19-normal, [name] -0.6  Patient Care Team    Relationship Specialty Notifications Start End  [name], NP PCP - General Family Medicine  02/02/17   Tanda Rockers, MD Consulting Physician Pulmonary Disease  01/26/17   Berle Mull, MD Consulting Physician Sports Medicine  01/26/17   Sydnee Levans, MD Consulting Physician Dermatology  01/26/17   Darleen Crocker, MD Consulting Physician Ophthalmology  01/26/17     Patient Active Problem List   Diagnosis Date Noted  . Elevated LDL cholesterol level 09/11/2018  . Elevated TSH 08/30/2018  . Fever 09/12/2017  . Acute otitis media 09/12/2017  . Vitamin D deficiency 05/02/2017  . Healthcare maintenance 05/02/2017  . Elevated cholesterol 03/03/2017  . Medicare annual wellness visit, subsequent 03/02/2017  . Chronic idiopathic constipation 01/26/2017  . Bronchitis 01/26/2017  . Osteoporosis of femur without pathological fracture 05/10/2014  . Cough variant asthma 10/13/2013  . Intrinsic asthma 12/10/2011     Past Medical History:  Diagnosis Date  . Asthma   .  GERD (gastroesophageal reflux disease)   . Headache(784.0)   . Pneumonia   . PONV (postoperative nausea and vomiting)   . Shortness of breath      Past Surgical History:  Procedure Laterality Date  . ABDOMINAL HYSTERECTOMY    . CHOLECYSTECTOMY    . RHINOPLASTY       Family History  Problem Relation Age of Onset  . Emphysema Father        smoked  . Heart disease Father   . Kidney cancer Father        with mets to liver and retina  . Heart disease Mother      Social History   Substance and Sexual Activity  Drug Use No     Social History   Substance and Sexual Activity  Alcohol Use Yes   Comment: glass of wine approx twice per wk     Social History   Tobacco Use  Smoking Status Never Smoker  Smokeless Tobacco Never Used     Outpatient Encounter Medications as of 09/11/2018  Medication Sig  . albuterol (PROAIR HFA) 108 (90 Base) MCG/ACT inhaler 2 puffs every 4 hours as needed only  if your can't catch your breath  . budesonide-formoterol (SYMBICORT) 80-4.5 MCG/ACT inhaler Inhale 2 puffs into the lungs 2 (two) times daily.  . calcium-vitamin D (OSCAL WITH D) 500-200 MG-UNIT per tablet Take 1 tablet by mouth daily.  . cholecalciferol (VITAMIN  D) 1000 UNITS tablet Take 2,000 Units by mouth daily.   . famotidine (PEPCID) 20 MG tablet Take by mouth one at bedtime  . fluticasone (FLONASE) 50 MCG/ACT nasal spray Place 2 sprays into the nose daily as needed. For allergies  . ibuprofen (ADVIL,MOTRIN) 200 MG tablet Take 600 mg by mouth every 6 (six) hours as needed. For pain  . montelukast (SINGULAIR) 10 MG tablet Take 1 tablet (10 mg total) by mouth at bedtime.  . predniSONE (DELTASONE) 20 MG tablet 1 tab every 12 hrs for 3 days, then 1 tab daily for 3 days   Facility-Administered Encounter Medications as of 09/11/2018  Medication  . Tdap (BOOSTRIX) injection 0.5 mL    Allergies: Food; Codeine; Penicillins; Sulfa antibiotics; and Neosporin af  [miconazole]  Body mass index is 30.67 kg/m.  Blood pressure 105/70, pulse 75, temperature 98.8 F (37.1 C), temperature source Oral, height 5' 10.25" (1.784 m), weight 215 lb 4.8 oz (97.7 kg), SpO2 99 %.     Review of Systems  Constitutional: Positive for fatigue. Negative for activity change, appetite change, chills, diaphoresis, fever and unexpected weight change.  HENT: Negative for congestion.   Respiratory: Negative for cough, chest tightness, shortness of breath, wheezing and stridor.   Cardiovascular: Negative for chest pain, palpitations and leg swelling.  Gastrointestinal: Negative for abdominal distention, anal bleeding, blood in stool, constipation, diarrhea, nausea and vomiting.  Genitourinary: Negative for difficulty urinating and flank pain.  Musculoskeletal: Positive for arthralgias. Negative for back pain, gait problem, joint swelling, myalgias, neck pain and neck stiffness.       L Shoulder r/t fall injury a few months ago  Followed by Ortho- continues to improve weekly  Skin: Negative for color change, pallor, rash and wound.  Neurological: Negative for dizziness and headaches.  Hematological: Does not bruise/bleed easily.  Psychiatric/Behavioral: Negative for agitation, behavioral problems, confusion, decreased concentration, dysphoric mood, hallucinations, self-injury, sleep disturbance and suicidal ideas. The patient is not nervous/anxious and is not hyperactive.        Objective:   Physical Exam Constitutional:      General: She is not in acute distress.    Appearance: Normal appearance. She is not toxic-appearing or diaphoretic.  HENT:     Head: Normocephalic and atraumatic.     Right Ear: Tympanic membrane, ear canal and external ear normal. There is no impacted cerumen.     Left Ear: Tympanic membrane, ear canal and external ear normal. There is no impacted cerumen.     Nose: Nose normal. No congestion or rhinorrhea.     Mouth/Throat:     Mouth: Mucous  membranes are dry.     Pharynx: No oropharyngeal exudate or posterior oropharyngeal erythema.  Eyes:     Extraocular Movements: Extraocular movements intact.     Conjunctiva/sclera: Conjunctivae normal.     Pupils: Pupils are equal, round, and reactive to light.  Neck:     Musculoskeletal: Normal range of motion and neck supple. No muscular tenderness.  Cardiovascular:     Rate and Rhythm: Normal rate and regular rhythm.     Pulses: Normal pulses.     Heart sounds: Normal heart sounds. No murmur. No friction rub. No gallop.   Pulmonary:     Effort: Pulmonary effort is normal. No respiratory distress.     Breath sounds: Normal breath sounds. No stridor. No decreased breath sounds, wheezing, rhonchi or rales.  Chest:     Chest wall: No tenderness.     Breasts:  Right: Normal.        Left: Normal.  Abdominal:     General: Abdomen is flat. Bowel sounds are normal. There is no distension.     Palpations: Abdomen is soft. There is no mass.     Tenderness: There is no abdominal tenderness. There is no right CVA tenderness, left CVA tenderness, guarding or rebound.     Hernia: No hernia is present.  Musculoskeletal: Normal range of motion.        General: Tenderness present. No deformity.     Left shoulder: She exhibits tenderness. She exhibits normal range of motion, no swelling and no deformity.     Right lower leg: No edema.     Left lower leg: No edema.  Lymphadenopathy:     Cervical: No cervical adenopathy.  Skin:    General: Skin is warm.     Capillary Refill: Capillary refill takes less than 2 seconds.  Neurological:     Mental Status: She is alert and oriented to person, place, and time.     Coordination: Coordination normal.     Deep Tendon Reflexes: Reflexes normal.  Psychiatric:        Mood and Affect: Mood normal.        Behavior: Behavior normal.        Thought Content: Thought content normal.        Judgment: Judgment normal.           Assessment & Plan:    1. Need for Tdap vaccination   2. Elevated LDL cholesterol level   3. Healthcare maintenance     Healthcare maintenance Overall your recent labs are stable, with elevation in LDL (bad) cholesterol 147 Please reduce saturated fat by at least 30% and continue regular exercise. Tdap order sent to pharmacy. Last DEXA scan 06/2018- normal Recommend fasting labs in 6 months (lab appt only) and annual physical every year.  Elevated LDL cholesterol level The 10-year ASCVD risk score [name], et al., 2013) is: 4.8%   Values used to calculate the score:     Age: 23 years     Sex: Female     Is Non-Hispanic African American: No     Diabetic: No     Tobacco smoker: No     Systolic Blood Pressure: 542 mmHg     Is BP treated: No     HDL Cholesterol: 64 mg/dL     Total Cholesterol: 231 mg/dL  LDL-147 Reduce saturated fat by at least 50%, continue regular exercise  Recheck lipids in 6 months    FOLLOW-UP:  Return in about 1 year (around 09/12/2019) for CPE, Fasting Labs.

## 2018-09-11 ENCOUNTER — Encounter: Payer: Self-pay | Admitting: Adult Health

## 2018-09-11 ENCOUNTER — Ambulatory Visit (INDEPENDENT_AMBULATORY_CARE_PROVIDER_SITE_OTHER): Payer: PPO | Admitting: Adult Health

## 2018-09-11 VITALS — BP 105/70 | HR 75 | Temp 98.8°F | Ht 70.25 in | Wt 215.3 lb

## 2018-09-11 DIAGNOSIS — Z23 Encounter for immunization: Secondary | ICD-10-CM

## 2018-09-11 DIAGNOSIS — Z0000 Encounter for general adult medical examination without abnormal findings: Secondary | ICD-10-CM

## 2018-09-11 DIAGNOSIS — E7800 Pure hypercholesterolemia, unspecified: Secondary | ICD-10-CM

## 2018-09-11 MED ORDER — MONTELUKAST SODIUM 10 MG PO TABS: 10.0000 mg | ORAL_TABLET | Freq: Every day | ORAL | 3 refills | Status: DC

## 2018-09-11 MED ORDER — BUDESONIDE-FORMOTEROL FUMARATE 80-4.5 MCG/ACT IN AERO: 2.0000 | INHALATION_SPRAY | Freq: Two times a day (BID) | RESPIRATORY_TRACT | 3 refills | Status: DC

## 2018-09-11 MED ORDER — TETANUS-DIPHTH-ACELL PERTUSSIS 5-2.5-18.5 LF-MCG/0.5 IM SUSP: 0.5000 mL | Freq: Once | INTRAMUSCULAR | Status: DC

## 2018-09-11 NOTE — Addendum Note (Signed)
Addended by: [name] on: 09/11/2018 10:17 AM   Modules accepted: Orders

## 2018-09-11 NOTE — Patient Instructions (Addendum)
Preventive Care for Adults, Female  A healthy lifestyle and preventive care can promote health and wellness. Preventive health guidelines for women include the following key practices.   A routine yearly physical is a good way to check with your health care provider about your health and preventive screening. It is a chance to share any concerns and updates on your health and to receive a thorough exam.   Visit your dentist for a routine exam and preventive care every 6 months. Brush your teeth twice a day and floss once a day. Good oral hygiene prevents tooth decay and gum disease.   The frequency of eye exams is based on your age, health, family medical history, use of contact lenses, and other factors. Follow your health care provider's recommendations for frequency of eye exams.   Eat a healthy diet. Foods like vegetables, fruits, whole grains, low-fat dairy products, and lean protein foods contain the nutrients you need without too many calories. Decrease your intake of foods high in solid fats, added sugars, and salt. Eat the right amount of calories for you.Get information about a proper diet from your health care provider, if necessary.   Regular physical exercise is one of the most important things you can do for your health. Most adults should get at least 150 minutes of moderate-intensity exercise (any activity that increases your heart rate and causes you to sweat) each week. In addition, most adults need muscle-strengthening exercises on 2 or more days a week.   Maintain a healthy weight. The body mass index (BMI) is a screening tool to identify possible weight problems. It provides an estimate of body fat based on height and weight. Your health care provider can find your BMI, and can help you achieve or maintain a healthy weight.For adults 20 years and older:   - A BMI below 18.5 is considered underweight.   - A BMI of 18.5 to 24.9 is normal.   - A BMI of 25 to 29.9 is  considered overweight.   - A BMI of 30 and above is considered obese.   Maintain normal blood lipids and cholesterol levels by exercising and minimizing your intake of trans and saturated fats.  Eat a balanced diet with plenty of fruit and vegetables. Blood tests for lipids and cholesterol should begin at age 20 and be repeated every 5 years minimum.  If your lipid or cholesterol levels are high, you are over 40, or you are at high risk for heart disease, you may need your cholesterol levels checked more frequently.Ongoing high lipid and cholesterol levels should be treated with medicines if diet and exercise are not working.   If you smoke, find out from your health care provider how to quit. If you do not use tobacco, do not start.   Lung cancer screening is recommended for adults aged 55-80 years who are at high risk for developing lung cancer because of a history of smoking. A yearly low-dose CT scan of the lungs is recommended for people who have at least a 30-pack-year history of smoking and are a current smoker or have quit within the past 15 years. A pack year of smoking is smoking an average of 1 pack of cigarettes a day for 1 year (for example: 1 pack a day for 30 years or 2 packs a day for 15 years). Yearly screening should continue until the smoker has stopped smoking for at least 15 years. Yearly screening should be stopped for people who develop a   health problem that would prevent them from having lung cancer treatment.   If you are pregnant, do not drink alcohol. If you are breastfeeding, be very cautious about drinking alcohol. If you are not pregnant and choose to drink alcohol, do not have more than 1 drink per day. One drink is considered to be 12 ounces (355 mL) of beer, 5 ounces (148 mL) of wine, or 1.5 ounces (44 mL) of liquor.   Avoid use of street drugs. Do not share needles with anyone. Ask for help if you need support or instructions about stopping the use of  drugs.   High blood pressure causes heart disease and increases the risk of stroke. Your blood pressure should be checked at least yearly.  Ongoing high blood pressure should be treated with medicines if weight loss and exercise do not work.   If you are 67-53 years old, ask your health care provider if you should take aspirin to prevent strokes.   Diabetes screening involves taking a blood sample to check your fasting blood sugar level. This should be done once every 3 years, after age 38, if you are within normal weight and without risk factors for diabetes. Testing should be considered at a younger age or be carried out more frequently if you are overweight and have at least 1 risk factor for diabetes.   Breast cancer screening is essential preventive care for women. You should practice "breast self-awareness."  This means understanding the normal appearance and feel of your breasts and may include breast self-examination.  Any changes detected, no matter how small, should be reported to a health care provider.  Women in their 38s and 30s should have a clinical breast exam (CBE) by a health care provider as part of a regular health exam every 1 to 3 years.  After age 7, women should have a CBE every year.  Starting at age 96, women should consider having a mammogram (breast X-ray test) every year.  Women who have a family history of breast cancer should talk to their health care provider about genetic screening.  Women at a high risk of breast cancer should talk to their health care providers about having an MRI and a mammogram every year.   -Breast cancer gene (BRCA)-related cancer risk assessment is recommended for women who have family members with BRCA-related cancers. BRCA-related cancers include breast, ovarian, tubal, and peritoneal cancers. Having family members with these cancers may be associated with an increased risk for harmful changes (mutations) in the breast cancer genes BRCA1 and  BRCA2. Results of the assessment will determine the need for genetic counseling and BRCA1 and BRCA2 testing.   The Pap test is a screening test for cervical cancer. A Pap test can show cell changes on the cervix that might become cervical cancer if left untreated. A Pap test is a procedure in which cells are obtained and examined from the lower end of the uterus (cervix).   - Women should have a Pap test starting at age 54.   - Between ages 72 and 93, Pap tests should be repeated every 2 years.   - Beginning at age 86, you should have a Pap test every 3 years as long as the past 3 Pap tests have been normal.   - Some women have medical problems that increase the chance of getting cervical cancer. Talk to your health care provider about these problems. It is especially important to talk to your health care provider if a  new problem develops soon after your last Pap test. In these cases, your health care provider may recommend more frequent screening and Pap tests.   - The above recommendations are the same for women who have or have not gotten the vaccine for human papillomavirus (HPV).   - If you had a hysterectomy for a problem that was not cancer or a condition that could lead to cancer, then you no longer need Pap tests. Even if you no longer need a Pap test, a regular exam is a good idea to make sure no other problems are starting.   - If you are between ages 36 and 66 years, and you have had normal Pap tests going back 10 years, you no longer need Pap tests. Even if you no longer need a Pap test, a regular exam is a good idea to make sure no other problems are starting.   - If you have had past treatment for cervical cancer or a condition that could lead to cancer, you need Pap tests and screening for cancer for at least 20 years after your treatment.   - If Pap tests have been discontinued, risk factors (such as a new sexual partner) need to be reassessed to determine if screening should  be resumed.   - The HPV test is an additional test that may be used for cervical cancer screening. The HPV test looks for the virus that can cause the cell changes on the cervix. The cells collected during the Pap test can be tested for HPV. The HPV test could be used to screen women aged 70 years and older, and should be used in women of any age who have unclear Pap test results. After the age of 67, women should have HPV testing at the same frequency as a Pap test.   Colorectal cancer can be detected and often prevented. Most routine colorectal cancer screening begins at the age of 57 years and continues through age 26 years. However, your health care provider may recommend screening at an earlier age if you have risk factors for colon cancer. On a yearly basis, your health care provider may provide home test kits to check for hidden blood in the stool.  Use of a small camera at the end of a tube, to directly examine the colon (sigmoidoscopy or colonoscopy), can detect the earliest forms of colorectal cancer. Talk to your health care provider about this at age 23, when routine screening begins. Direct exam of the colon should be repeated every 5 -10 years through age 49 years, unless early forms of pre-cancerous polyps or small growths are found.   People who are at an increased risk for hepatitis B should be screened for this virus. You are considered at high risk for hepatitis B if:  -You were born in a country where hepatitis B occurs often. Talk with your health care provider about which countries are considered high risk.  - Your parents were born in a high-risk country and you have not received a shot to protect against hepatitis B (hepatitis B vaccine).  - You have HIV or AIDS.  - You use needles to inject street drugs.  - You live with, or have sex with, someone who has Hepatitis B.  - You get hemodialysis treatment.  - You take certain medicines for conditions like cancer, organ  transplantation, and autoimmune conditions.   Hepatitis C blood testing is recommended for all people born from [birth year] through 1965 and any individual  with known risks for hepatitis C.   Practice safe sex. Use condoms and avoid high-risk sexual practices to reduce the spread of sexually transmitted infections (STIs). STIs include gonorrhea, chlamydia, syphilis, trichomonas, herpes, HPV, and human immunodeficiency virus (HIV). Herpes, HIV, and HPV are viral illnesses that have no cure. They can result in disability, cancer, and death. Sexually active women aged 25 years and younger should be checked for chlamydia. Older women with new or multiple partners should also be tested for chlamydia. Testing for other STIs is recommended if you are sexually active and at increased risk.   Osteoporosis is a disease in which the bones lose minerals and strength with aging. This can result in serious bone fractures or breaks. The risk of osteoporosis can be identified using a bone density scan. Women ages 65 years and over and women at risk for fractures or osteoporosis should discuss screening with their health care providers. Ask your health care provider whether you should take a calcium supplement or vitamin D to There are also several preventive steps women can take to avoid osteoporosis and resulting fractures or to keep osteoporosis from worsening. -->Recommendations include:  Eat a balanced diet high in fruits, vegetables, calcium, and vitamins.  Get enough calcium. The recommended total intake of is 1,200 mg daily; for best absorption, if taking supplements, divide doses into 250-500 mg doses throughout the day. Of the two types of calcium, calcium carbonate is best absorbed when taken with food but calcium citrate can be taken on an empty stomach.  Get enough vitamin D. NAMS and the National Osteoporosis Foundation recommend at least 1,000 IU per day for women age 50 and over who are at risk of vitamin D  deficiency. Vitamin D deficiency can be caused by inadequate sun exposure (for example, those who live in northern latitudes).  Avoid alcohol and smoking. Heavy alcohol intake (more than 7 drinks per week) increases the risk of falls and hip fracture and women smokers tend to lose bone more rapidly and have lower bone mass than nonsmokers. Stopping smoking is one of the most important changes women can make to improve their health and decrease risk for disease.  Be physically active every day. Weight-bearing exercise (for example, fast walking, hiking, jogging, and weight training) may strengthen bones or slow the rate of bone loss that comes with aging. Balancing and muscle-strengthening exercises can reduce the risk of falling and fracture.  Consider therapeutic medications. Currently, several types of effective drugs are available. Healthcare providers can recommend the type most appropriate for each woman.  Eliminate environmental factors that may contribute to accidents. Falls cause nearly 90% of all osteoporotic fractures, so reducing this risk is an important bone-health strategy. Measures include ample lighting, removing obstructions to walking, using nonskid rugs on floors, and placing mats and/or grab bars in showers.  Be aware of medication side effects. Some common medicines make bones weaker. These include a type of steroid drug called glucocorticoids used for arthritis and asthma, some antiseizure drugs, certain sleeping pills, treatments for endometriosis, and some cancer drugs. An overactive thyroid gland or using too much thyroid hormone for an underactive thyroid can also be a problem. If you are taking these medicines, talk to your doctor about what you can do to help protect your bones.reduce the rate of osteoporosis.    Menopause can be associated with physical symptoms and risks. Hormone replacement therapy is available to decrease symptoms and risks. You should talk to your  health care provider   about whether hormone replacement therapy is right for you.   Use sunscreen. Apply sunscreen liberally and repeatedly throughout the day. You should seek shade when your shadow is shorter than you. Protect yourself by wearing long sleeves, pants, a wide-brimmed hat, and sunglasses year round, whenever you are outdoors.   Once a month, do a whole body skin exam, using a mirror to look at the skin on your back. Tell your health care provider of new moles, moles that have irregular borders, moles that are larger than a pencil eraser, or moles that have changed in shape or color.   -Stay current with required vaccines (immunizations).   Influenza vaccine. All adults should be immunized every year.  Tetanus, diphtheria, and acellular pertussis (Td, Tdap) vaccine. Pregnant women should receive 1 dose of Tdap vaccine during each pregnancy. The dose should be obtained regardless of the length of time since the last dose. Immunization is preferred during the 27th 36th week of gestation. An adult who has not previously received Tdap or who does not know her vaccine status should receive 1 dose of Tdap. This initial dose should be followed by tetanus and diphtheria toxoids (Td) booster doses every 10 years. Adults with an unknown or incomplete history of completing a 3-dose immunization series with Td-containing vaccines should begin or complete a primary immunization series including a Tdap dose. Adults should receive a Td booster every 10 years.  Varicella vaccine. An adult without evidence of immunity to varicella should receive 2 doses or a second dose if she has previously received 1 dose. Pregnant females who do not have evidence of immunity should receive the first dose after pregnancy. This first dose should be obtained before leaving the health care facility. The second dose should be obtained 4 8 weeks after the first dose.  Human papillomavirus (HPV) vaccine. Females aged 13 26  years who have not received the vaccine previously should obtain the 3-dose series. The vaccine is not recommended for use in pregnant females. However, pregnancy testing is not needed before receiving a dose. If a female is found to be pregnant after receiving a dose, no treatment is needed. In that case, the remaining doses should be delayed until after the pregnancy. Immunization is recommended for any person with an immunocompromised condition through the age of 26 years if she did not get any or all doses earlier. During the 3-dose series, the second dose should be obtained 4 8 weeks after the first dose. The third dose should be obtained 24 weeks after the first dose and 16 weeks after the second dose.  Zoster vaccine. One dose is recommended for adults aged 60 years or older unless certain conditions are present.  Measles, mumps, and rubella (MMR) vaccine. Adults born before 1957 generally are considered immune to measles and mumps. Adults born in 1957 or later should have 1 or more doses of MMR vaccine unless there is a contraindication to the vaccine or there is laboratory evidence of immunity to each of the three diseases. A routine second dose of MMR vaccine should be obtained at least 28 days after the first dose for students attending postsecondary schools, health care workers, or international travelers. People who received inactivated measles vaccine or an unknown type of measles vaccine during 1963 1967 should receive 2 doses of MMR vaccine. People who received inactivated mumps vaccine or an unknown type of mumps vaccine before 1979 and are at high risk for mumps infection should consider immunization with 2 doses of   MMR vaccine. For females of childbearing age, rubella immunity should be determined. If there is no evidence of immunity, females who are not pregnant should be vaccinated. If there is no evidence of immunity, females who are pregnant should delay immunization until after pregnancy.  Unvaccinated health care workers born before [birth year] who lack laboratory evidence of measles, mumps, or rubella immunity or laboratory confirmation of disease should consider measles and mumps immunization with 2 doses of MMR vaccine or rubella immunization with 1 dose of MMR vaccine.  Pneumococcal 13-valent conjugate (PCV13) vaccine. When indicated, a person who is uncertain of her immunization history and has no record of immunization should receive the PCV13 vaccine. An adult aged 3 years or older who has certain medical conditions and has not been previously immunized should receive 1 dose of PCV13 vaccine. This PCV13 should be followed with a dose of pneumococcal polysaccharide (PPSV23) vaccine. The PPSV23 vaccine dose should be obtained at least 8 weeks after the dose of PCV13 vaccine. An adult aged 60 years or older who has certain medical conditions and previously received 1 or more doses of PPSV23 vaccine should receive 1 dose of PCV13. The PCV13 vaccine dose should be obtained 1 or more years after the last PPSV23 vaccine dose.  Pneumococcal polysaccharide (PPSV23) vaccine. When PCV13 is also indicated, PCV13 should be obtained first. All adults aged 43 years and older should be immunized. An adult younger than age 67 years who has certain medical conditions should be immunized. Any person who resides in a nursing home or long-term care facility should be immunized. An adult smoker should be immunized. People with an immunocompromised condition and certain other conditions should receive both PCV13 and PPSV23 vaccines. People with human immunodeficiency virus (HIV) infection should be immunized as soon as possible after diagnosis. Immunization during chemotherapy or radiation therapy should be avoided. Routine use of PPSV23 vaccine is not recommended for American Indians, [place] Natives, or people younger than 65 years unless there are medical conditions that require PPSV23 vaccine. When indicated,  people who have unknown immunization and have no record of immunization should receive PPSV23 vaccine. One-time revaccination 5 years after the first dose of PPSV23 is recommended for people aged 46 64 years who have chronic kidney failure, nephrotic syndrome, asplenia, or immunocompromised conditions. People who received 1 2 doses of PPSV23 before age 80 years should receive another dose of PPSV23 vaccine at age 74 years or later if at least 5 years have passed since the previous dose. Doses of PPSV23 are not needed for people immunized with PPSV23 at or after age 11 years.  Meningococcal vaccine. Adults with asplenia or persistent complement component deficiencies should receive 2 doses of quadrivalent meningococcal conjugate (MenACWY-D) vaccine. The doses should be obtained at least 2 months apart. Microbiologists working with certain meningococcal bacteria, [employer] recruits, people at risk during an outbreak, and people who travel to or live in countries with a high rate of meningitis should be immunized. A first-year college student up through age 19 years who is living in a residence hall should receive a dose if she did not receive a dose on or after her 16th birthday. Adults who have certain high-risk conditions should receive one or more doses of vaccine.  Hepatitis A vaccine. Adults who wish to be protected from this disease, have certain high-risk conditions, work with hepatitis A-infected animals, work in hepatitis A research labs, or travel to or work in countries with a high rate of hepatitis A should be  immunized. Adults who were previously unvaccinated and who anticipate close contact with an international adoptee during the first 60 days after arrival in the [place] from a country with a high rate of hepatitis A should be immunized.  Hepatitis B vaccine.  Adults who wish to be protected from this disease, have certain high-risk conditions, may be exposed to blood or other infectious  body fluids, are household contacts or sex partners of hepatitis B positive people, are clients or workers in certain care facilities, or travel to or work in countries with a high rate of hepatitis B should be immunized.  Haemophilus influenzae type b (Hib) vaccine. A previously unvaccinated person with asplenia or sickle cell disease or having a scheduled splenectomy should receive 1 dose of Hib vaccine. Regardless of previous immunization, a recipient of a hematopoietic stem cell transplant should receive a 3-dose series 6 12 months after her successful transplant. Hib vaccine is not recommended for adults with HIV infection.  Preventive Services / Frequency Ages 6 to 39years  Blood pressure check.** / Every 1 to 2 years.  Lipid and cholesterol check.** / Every 5 years beginning at age 39.  Clinical breast exam.** / Every 3 years for women in their 61s and 62s.  BRCA-related cancer risk assessment.** / For women who have family members with a BRCA-related cancer (breast, ovarian, tubal, or peritoneal cancers).  Pap test.** / Every 2 years from ages 47 through 85. Every 3 years starting at age 34 through age 12 or 74 with a history of 3 consecutive normal Pap tests.  HPV screening.** / Every 3 years from ages 46 through ages 43 to 54 with a history of 3 consecutive normal Pap tests.  Hepatitis C blood test.** / For any individual with known risks for hepatitis C.  Skin self-exam. / Monthly.  Influenza vaccine. / Every year.  Tetanus, diphtheria, and acellular pertussis (Tdap, Td) vaccine.** / Consult your health care provider. Pregnant women should receive 1 dose of Tdap vaccine during each pregnancy. 1 dose of Td every 10 years.  Varicella vaccine.** / Consult your health care provider. Pregnant females who do not have evidence of immunity should receive the first dose after pregnancy.  HPV vaccine. / 3 doses over 6 months, if 64 and younger. The vaccine is not recommended for use in  pregnant females. However, pregnancy testing is not needed before receiving a dose.  Measles, mumps, rubella (MMR) vaccine.** / You need at least 1 dose of MMR if you were born in 1957 or later. You may also need a 2nd dose. For females of childbearing age, rubella immunity should be determined. If there is no evidence of immunity, females who are not pregnant should be vaccinated. If there is no evidence of immunity, females who are pregnant should delay immunization until after pregnancy.  Pneumococcal 13-valent conjugate (PCV13) vaccine.** / Consult your health care provider.  Pneumococcal polysaccharide (PPSV23) vaccine.** / 1 to 2 doses if you smoke cigarettes or if you have certain conditions.  Meningococcal vaccine.** / 1 dose if you are age 71 to 37 years and a Market researcher living in a residence hall, or have one of several medical conditions, you need to get vaccinated against meningococcal disease. You may also need additional booster doses.  Hepatitis A vaccine.** / Consult your health care provider.  Hepatitis B vaccine.** / Consult your health care provider.  Haemophilus influenzae type b (Hib) vaccine.** / Consult your health care provider.  Ages 55 to 64years  Blood pressure check.** / Every 1 to 2 years.  Lipid and cholesterol check.** / Every 5 years beginning at age 37 years.  Lung cancer screening. / Every year if you are aged 74 80 years and have a 30-pack-year history of smoking and currently smoke or have quit within the past 15 years. Yearly screening is stopped once you have quit smoking for at least 15 years or develop a health problem that would prevent you from having lung cancer treatment.  Clinical breast exam.** / Every year after age 43 years.  BRCA-related cancer risk assessment.** / For women who have family members with a BRCA-related cancer (breast, ovarian, tubal, or peritoneal cancers).  Mammogram.** / Every year beginning at age 15  years and continuing for as long as you are in good health. Consult with your health care provider.  Pap test.** / Every 3 years starting at age 16 years through age 26 or 48 years with a history of 3 consecutive normal Pap tests.  HPV screening.** / Every 3 years from ages 61 years through ages 28 to 103 years with a history of 3 consecutive normal Pap tests.  Fecal occult blood test (FOBT) of stool. / Every year beginning at age 87 years and continuing until age 84 years. You may not need to do this test if you get a colonoscopy every 10 years.  Flexible sigmoidoscopy or colonoscopy.** / Every 5 years for a flexible sigmoidoscopy or every 10 years for a colonoscopy beginning at age 83 years and continuing until age 39 years.  Hepatitis C blood test.** / For all people born from [birth year] through 1965 and any individual with known risks for hepatitis C.  Skin self-exam. / Monthly.  Influenza vaccine. / Every year.  Tetanus, diphtheria, and acellular pertussis (Tdap/Td) vaccine.** / Consult your health care provider. Pregnant women should receive 1 dose of Tdap vaccine during each pregnancy. 1 dose of Td every 10 years.  Varicella vaccine.** / Consult your health care provider. Pregnant females who do not have evidence of immunity should receive the first dose after pregnancy.  Zoster vaccine.** / 1 dose for adults aged 33 years or older.  Measles, mumps, rubella (MMR) vaccine.** / You need at least 1 dose of MMR if you were born in 1957 or later. You may also need a 2nd dose. For females of childbearing age, rubella immunity should be determined. If there is no evidence of immunity, females who are not pregnant should be vaccinated. If there is no evidence of immunity, females who are pregnant should delay immunization until after pregnancy.  Pneumococcal 13-valent conjugate (PCV13) vaccine.** / Consult your health care provider.  Pneumococcal polysaccharide (PPSV23) vaccine.** / 1 to 2 doses if  you smoke cigarettes or if you have certain conditions.  Meningococcal vaccine.** / Consult your health care provider.  Hepatitis A vaccine.** / Consult your health care provider.  Hepatitis B vaccine.** / Consult your health care provider.  Haemophilus influenzae type b (Hib) vaccine.** / Consult your health care provider.  Ages 25 years and over  Blood pressure check.** / Every 1 to 2 years.  Lipid and cholesterol check.** / Every 5 years beginning at age 38 years.  Lung cancer screening. / Every year if you are aged 24 80 years and have a 30-pack-year history of smoking and currently smoke or have quit within the past 15 years. Yearly screening is stopped once you have quit smoking for at least 15 years or develop a health problem that  would prevent you from having lung cancer treatment.  Clinical breast exam.** / Every year after age 90 years.  BRCA-related cancer risk assessment.** / For women who have family members with a BRCA-related cancer (breast, ovarian, tubal, or peritoneal cancers).  Mammogram.** / Every year beginning at age 63 years and continuing for as long as you are in good health. Consult with your health care provider.  Pap test.** / Every 3 years starting at age 102 years through age 15 or 93 years with 3 consecutive normal Pap tests. Testing can be stopped between 65 and 70 years with 3 consecutive normal Pap tests and no abnormal Pap or HPV tests in the past 10 years.  HPV screening.** / Every 3 years from ages 2 years through ages 64 or 33 years with a history of 3 consecutive normal Pap tests. Testing can be stopped between 65 and 70 years with 3 consecutive normal Pap tests and no abnormal Pap or HPV tests in the past 10 years.  Fecal occult blood test (FOBT) of stool. / Every year beginning at age 50 years and continuing until age 18 years. You may not need to do this test if you get a colonoscopy every 10 years.  Flexible sigmoidoscopy or colonoscopy.** /  Every 5 years for a flexible sigmoidoscopy or every 10 years for a colonoscopy beginning at age 22 years and continuing until age 67 years.  Hepatitis C blood test.** / For all people born from [birth year] through 1965 and any individual with known risks for hepatitis C.  Osteoporosis screening.** / A one-time screening for women ages 33 years and over and women at risk for fractures or osteoporosis.  Skin self-exam. / Monthly.  Influenza vaccine. / Every year.  Tetanus, diphtheria, and acellular pertussis (Tdap/Td) vaccine.** / 1 dose of Td every 10 years.  Varicella vaccine.** / Consult your health care provider.  Zoster vaccine.** / 1 dose for adults aged 75 years or older.  Pneumococcal 13-valent conjugate (PCV13) vaccine.** / Consult your health care provider.  Pneumococcal polysaccharide (PPSV23) vaccine.** / 1 dose for all adults aged 69 years and older.  Meningococcal vaccine.** / Consult your health care provider.  Hepatitis A vaccine.** / Consult your health care provider.  Hepatitis B vaccine.** / Consult your health care provider.  Haemophilus influenzae type b (Hib) vaccine.** / Consult your health care provider. ** Family history and personal history of risk and conditions may change your health care provider's recommendations. Document Released: 10/26/2001 Document Revised: 06/20/2013  [facility] Patient Information 2014 [place], [place].   EXERCISE AND DIET:  We recommended that you start or continue a regular exercise program for good health. Regular exercise means any activity that makes your heart beat faster and makes you sweat.  We recommend exercising at least 30 minutes per day at least 3 days a week, preferably 5.  We also recommend a diet low in fat and sugar / carbohydrates.  Inactivity, poor dietary choices and obesity can cause diabetes, heart attack, stroke, and kidney damage, among others.     ALCOHOL AND SMOKING:  Women should limit their alcohol intake to no  more than 7 drinks/beers/glasses of wine (combined, not each!) per week. Moderation of alcohol intake to this level decreases your risk of breast cancer and liver damage.  ( And of course, no recreational drugs are part of a healthy lifestyle.)  Also, you should not be smoking at all or even being exposed to second hand smoke. Most people know smoking can  cause cancer, and various heart and lung diseases, but did you know it also contributes to weakening of your bones?  Aging of your skin?  Yellowing of your teeth and nails?   CALCIUM AND VITAMIN D:  Adequate intake of calcium and Vitamin D are recommended.  The recommendations for exact amounts of these supplements seem to change often, but generally speaking 600 mg of calcium (either carbonate or citrate) and 800 units of Vitamin D per day seems prudent. Certain women may benefit from higher intake of Vitamin D.  If you are among these women, your doctor will have told you during your visit.     PAP SMEARS:  Pap smears, to check for cervical cancer or precancers,  have traditionally been done yearly, although recent scientific advances have shown that most women can have pap smears less often.  However, every woman still should have a physical exam from her gynecologist or primary care physician every year. It will include a breast check, inspection of the vulva and vagina to check for abnormal growths or skin changes, a visual exam of the cervix, and then an exam to evaluate the size and shape of the uterus and ovaries.  And after 67 years of age, a rectal exam is indicated to check for rectal cancers. We will also provide age appropriate advice regarding health maintenance, like when you should have certain vaccines, screening for sexually transmitted diseases, bone density testing, colonoscopy, mammograms, etc.    MAMMOGRAMS:  All women over 20 years old should have a yearly mammogram. Many facilities now offer a "3D" mammogram, which may cost  around $50 extra out of pocket. If possible,  we recommend you accept the option to have the 3D mammogram performed.  It both reduces the number of women who will be called back for extra views which then turn out to be normal, and it is better than the routine mammogram at detecting truly abnormal areas.     COLONOSCOPY:  Colonoscopy to screen for colon cancer is recommended for all women at age 100.  We know, you hate the idea of the prep.  We agree, BUT, having colon cancer and not knowing it is worse!!  Colon cancer so often starts as a polyp that can be seen and removed at colonscopy, which can quite literally save your life!  And if your first colonoscopy is normal and you have no family history of colon cancer, most women don't have to have it again for 10 years.  Once every ten years, you can do something that may end up saving your life, right?  We will be happy to help you get it scheduled when you are ready.  Be sure to check your insurance coverage so you understand how much it will cost.  It may be covered as a preventative service at no cost, but you should check your particular policy.   Overall your recent labs are stable, with elevation in LDL (bad) cholesterol 147 Please reduce saturated fat by at least 30% and continue regular exercise. Tdap order sent to pharmacy. Last DEXA scan 06/2018- normal Recommend fasting labs in 6 months (lab appt only) and annual physical every year. NICE TO SEE YOU!

## 2018-09-11 NOTE — Assessment & Plan Note (Signed)
Overall your recent labs are stable, with elevation in LDL (bad) cholesterol 147 Please reduce saturated fat by at least 30% and continue regular exercise. Tdap order sent to pharmacy. Last DEXA scan 06/2018- normal Recommend fasting labs in 6 months (lab appt only) and annual physical every year.

## 2018-09-11 NOTE — Assessment & Plan Note (Addendum)
The 10-year ASCVD risk score Mikey Bussing DC Brooke Bonito., et al., 2013) is: 4.8%   Values used to calculate the score:     Age: 67 years     Sex: Female     Is Non-Hispanic African American: No     Diabetic: No     Tobacco smoker: No     Systolic Blood Pressure: 327 mmHg     Is BP treated: No     HDL Cholesterol: 64 mg/dL     Total Cholesterol: 231 mg/dL  LDL-147 Reduce saturated fat by at least 50%, continue regular exercise  Recheck lipids in 6 months

## 2018-09-30 ENCOUNTER — Encounter (HOSPITAL_COMMUNITY): Payer: Self-pay

## 2018-09-30 ENCOUNTER — Emergency Department (HOSPITAL_COMMUNITY): Payer: PPO

## 2018-09-30 ENCOUNTER — Emergency Department (HOSPITAL_COMMUNITY)
Admission: EM | Admit: 2018-09-30 | Discharge: 2018-09-30 | Disposition: A | Discharge status: Home / Self Care | Payer: PPO | Attending: Emergency Medicine | Admitting: Emergency Medicine

## 2018-09-30 ENCOUNTER — Other Ambulatory Visit: Payer: Self-pay

## 2018-09-30 DIAGNOSIS — M79671 Pain in right foot: Secondary | ICD-10-CM | POA: Insufficient documentation

## 2018-09-30 DIAGNOSIS — Z79899 Other long term (current) drug therapy: Secondary | ICD-10-CM | POA: Diagnosis not present

## 2018-09-30 DIAGNOSIS — S99921A Unspecified injury of right foot, initial encounter: Secondary | ICD-10-CM | POA: Diagnosis not present

## 2018-09-30 DIAGNOSIS — J45909 Unspecified asthma, uncomplicated: Secondary | ICD-10-CM | POA: Diagnosis not present

## 2018-09-30 NOTE — ED Provider Notes (Signed)
[facility] EMERGENCY DEPARTMENT Provider Note   CSN: [id number] Arrival date & time: 09/30/18  1751     History   Chief Complaint Chief Complaint  Patient presents with  . Foot Injury    HPI Cindy Rogers is a 68 y.o. female with a past medical history of GERD presents today for evaluation of right foot pain.  She reports that she was at her son's house wallpapering a nursery when she stepped back onto a level with her right foot.  She reports immediate onset of pain in her right foot.  She denies any other injuries.  She is able to bear weight on the heel however not on the midfoot where her pain is.  She denies any ankle pain.  Did not fall or strike her head.       HPI  Past Medical History:  Diagnosis Date  . Asthma   . GERD (gastroesophageal reflux disease)   . Headache(784.0)   . Pneumonia   . PONV (postoperative nausea and vomiting)   . Shortness of breath     Patient Active Problem List   Diagnosis Date Noted  . Elevated LDL cholesterol level 09/11/2018  . Elevated TSH 08/30/2018  . Fever 09/12/2017  . Acute otitis media 09/12/2017  . Vitamin D deficiency 05/02/2017  . Healthcare maintenance 05/02/2017  . Elevated cholesterol 03/03/2017  . Medicare annual wellness visit, subsequent 03/02/2017  . Chronic idiopathic constipation 01/26/2017  . Bronchitis 01/26/2017  . Osteoporosis of femur without pathological fracture 05/10/2014  . Cough variant asthma 10/13/2013  . Intrinsic asthma 12/10/2011    Past Surgical History:  Procedure Laterality Date  . ABDOMINAL HYSTERECTOMY    . CHOLECYSTECTOMY    . RHINOPLASTY       OB History   No obstetric history on file.      Home Medications    Prior to Admission medications   Medication Sig Start Date End Date Taking? Authorizing Provider  albuterol (PROAIR HFA) 108 (90 Base) MCG/ACT inhaler 2 puffs every 4 hours as needed only  if your can't catch your breath 08/14/18   Danford, Valetta Fuller D,  NP  budesonide-formoterol (SYMBICORT) 80-4.5 MCG/ACT inhaler Inhale 2 puffs into the lungs 2 (two) times daily. 09/11/18   Danford, Valetta Fuller D, NP  calcium-vitamin D (OSCAL WITH D) 500-200 MG-UNIT per tablet Take 1 tablet by mouth daily.    [provider]  cholecalciferol (VITAMIN D) 1000 UNITS tablet Take 2,000 Units by mouth daily.     [provider]  famotidine (PEPCID) 20 MG tablet Take by mouth one at bedtime    [provider]  fluticasone (FLONASE) 50 MCG/ACT nasal spray Place 2 sprays into the nose daily as needed. For allergies    [provider]  ibuprofen (ADVIL,MOTRIN) 200 MG tablet Take 600 mg by mouth every 6 (six) hours as needed. For pain    [provider]  montelukast (SINGULAIR) 10 MG tablet Take 1 tablet (10 mg total) by mouth at bedtime. 09/11/18   Danford, Valetta Fuller D, NP  predniSONE (DELTASONE) 20 MG tablet 1 tab every 12 hrs for 3 days, then 1 tab daily for 3 days 08/14/18   [place] Grandchild, NP    Family History Family History  Problem Relation Age of Onset  . Emphysema Father        smoked  . Heart disease Father   . Kidney cancer Father        with mets to liver  and retina  . Heart disease Mother     Social History Social History   Tobacco Use  . Smoking status: Never Smoker  . Smokeless tobacco: Never Used  Substance Use Topics  . Alcohol use: Yes    Comment: glass of wine approx twice per wk  . Drug use: No     Allergies   Food; Codeine; Penicillins; Sulfa antibiotics; and Neosporin af [miconazole]   Review of Systems Review of Systems  Constitutional: Negative for chills and fever.  Skin: Negative for wound.  Neurological: Negative for weakness, numbness and headaches.  All other systems reviewed and are negative.    Physical Exam Updated Vital Signs BP (!) 155/85 (BP Location: Left Arm)   Pulse 93   Temp 98.2 F (36.8 C) (Oral)   Resp 16   SpO2 97%   Physical Exam Vitals signs and nursing note  reviewed.  Constitutional:      General: She is not in acute distress.    Appearance: She is not ill-appearing.  HENT:     Head: Normocephalic.  Cardiovascular:     Rate and Rhythm: Normal rate.     Comments: Right foot 2+ DP/PT pulse. Pulmonary:     Effort: Pulmonary effort is normal. No respiratory distress.  Musculoskeletal:     Comments: Right foot has mild swelling over the lateral aspect.  There is no deformity or crepitus.  The lateral midfoot is diffusely tender without localized pinpoint tenderness to palpation.  No swelling.  No tenderness to palpation over bilateral malleoli or proximal lower right leg.  Right lower leg compartments are soft, easily compressible.  Skin:    Comments: No skin wounds on right foot or ankle.  No bruising.  Neurological:     Mental Status: She is alert.     Comments: Sensation intact to right foot.      ED Treatments / Results  Labs (all labs ordered are listed, but only abnormal results are displayed) Labs Reviewed - No data to display  EKG None  Radiology Dg Foot Complete Right  Result Date: 09/30/2018 CLINICAL DATA:  68 year old female with trauma to right foot. EXAM: RIGHT FOOT COMPLETE - 3+ VIEW COMPARISON:  None. FINDINGS: There is no evidence of fracture or dislocation. There is no evidence of arthropathy or other focal bone abnormality. Soft tissues are unremarkable. IMPRESSION: Negative. Electronically Signed   By: Anner Crete M.D.   On: 09/30/2018 19:12    Procedures Procedures (including critical care time)  Medications Ordered in ED Medications - No data to display   Initial Impression / Assessment and Plan / ED Course  I have reviewed the triage vital signs and the nursing notes.  Pertinent labs & imaging results that were available during my care of the patient were reviewed by me and considered in my medical decision making (see chart for details).    Patient presents today for evaluation of sudden onset  right foot pain after she stumbled and stepped onto a level.  She denies any other injuries.  X-rays were obtained which are negative for acute fracture or abnormality.  Mild diffuse tenderness along the lateral right foot where she stepped on the object.  She has a boot at home and a walker at home that she says she can use if she needs to.  She is given an Ace wrap here.  Conservative care including over-the-counter pain medicine recommended.  Recommended PCP follow-up if symptoms fail to significantly improve in 7 days or  if they worsen.  Return precautions were discussed with patient who states their understanding.  At the time of discharge patient denied any unaddressed complaints or concerns.  Patient is agreeable for discharge home.   Final Clinical Impressions(s) / ED Diagnoses   Final diagnoses:  Right foot pain    ED Discharge Orders    None       Ollen Gross 09/30/18 2005    Long, Joshua G, MD 09/30/18 2355

## 2018-09-30 NOTE — Discharge Instructions (Signed)

## 2018-09-30 NOTE — ED Notes (Signed)
Patient verbalizes understanding of discharge instructions. Opportunity for questioning and answers were provided. Armband removed by staff, pt discharged from ED.  

## 2018-09-30 NOTE — ED Triage Notes (Signed)
Pt stepped off step ladder, fell back, turned right foot, unable to bear full weight on right foot.  Pain to lateral right foot.

## 2018-12-01 ENCOUNTER — Telehealth: Payer: PPO | Admitting: Family

## 2018-12-01 DIAGNOSIS — J208 Acute bronchitis due to other specified organisms: Secondary | ICD-10-CM | POA: Diagnosis not present

## 2018-12-01 DIAGNOSIS — B9689 Other specified bacterial agents as the cause of diseases classified elsewhere: Secondary | ICD-10-CM

## 2018-12-01 MED ORDER — DOXYCYCLINE HYCLATE 100 MG PO TABS: 100.0000 mg | ORAL_TABLET | Freq: Two times a day (BID) | ORAL | 0 refills | Status: DC

## 2018-12-01 MED ORDER — PREDNISONE 10 MG (21) PO TBPK: ORAL_TABLET | ORAL | 0 refills | Status: DC

## 2018-12-01 NOTE — Progress Notes (Signed)
We are sorry that you are not feeling well.  Here is how we plan to help!  Based on your presentation I believe you most likely have A cough due to bacteria.  When patients have a fever and a productive cough with a change in color or increased sputum production, we are concerned about bacterial bronchitis.  If left untreated it can progress to pneumonia.  If your symptoms do not improve with your treatment plan it is important that you contact your provider.   I have prescribed Doxycycline 100 mg twice a day for 7 days     In addition you may use A non-prescription cough medication called Mucinex DM: take 2 tablets every 12 hours.  Prednisone 10 mg daily for 6 days (see taper instructions below)  Directions for 6 day taper: Day 1: 2 tablets before breakfast, 1 after both lunch & dinner and 2 at bedtime Day 2: 1 tab before breakfast, 1 after both lunch & dinner and 2 at bedtime Day 3: 1 tab at each meal & 1 at bedtime Day 4: 1 tab at breakfast, 1 at lunch, 1 at bedtime Day 5: 1 tab at breakfast & 1 tab at bedtime Day 6: 1 tab at breakfast   From your responses in the eVisit questionnaire you describe inflammation in the upper respiratory tract which is causing a significant cough.  This is commonly called Bronchitis and has four common causes:    Allergies  Viral Infections  Acid Reflux  Bacterial Infection Allergies, viruses and acid reflux are treated by controlling symptoms or eliminating the cause. An example might be a cough caused by taking certain blood pressure medications. You stop the cough by changing the medication. Another example might be a cough caused by acid reflux. Controlling the reflux helps control the cough.  USE OF BRONCHODILATOR ("RESCUE") INHALERS: There is a risk from using your bronchodilator too frequently.  The risk is that over-reliance on a medication which only relaxes the muscles surrounding the breathing tubes can reduce the effectiveness of medications  prescribed to reduce swelling and congestion of the tubes themselves.  Although you feel brief relief from the bronchodilator inhaler, your asthma may actually be worsening with the tubes becoming more swollen and filled with mucus.  This can delay other crucial treatments, such as oral steroid medications. If you need to use a bronchodilator inhaler daily, several times per day, you should discuss this with your provider.  There are probably better treatments that could be used to keep your asthma under control.     HOME CARE . Only take medications as instructed by your medical team. . Complete the entire course of an antibiotic. . Drink plenty of fluids and get plenty of rest. . Avoid close contacts especially the very young and the elderly . Cover your mouth if you cough or cough into your sleeve. . Always remember to wash your hands . A steam or ultrasonic humidifier can help congestion.   GET HELP RIGHT AWAY IF: . You develop worsening fever. . You become short of breath . You cough up blood. . Your symptoms persist after you have completed your treatment plan MAKE SURE YOU   Understand these instructions.  Will watch your condition.  Will get help right away if you are not doing well or get worse.  Your e-visit answers were reviewed by a board certified advanced clinical practitioner to complete your personal care plan.  Depending on the condition, your plan could have included  both over the counter or prescription medications. If there is a problem please reply  once you have received a response from your provider. Your safety is important to [place].  If you have drug allergies check your prescription carefully.    You can use MyChart to ask questions about today's visit, request a non-urgent call back, or ask for a work or school excuse for 24 hours related to this e-Visit. If it has been greater than 24 hours you will need to follow up with your provider, or enter a new e-Visit to  address those concerns. You will get an e-mail in the next two days asking about your experience.  I hope that your e-visit has been valuable and will speed your recovery. Thank you for using e-visits.

## 2019-01-01 ENCOUNTER — Ambulatory Visit (INDEPENDENT_AMBULATORY_CARE_PROVIDER_SITE_OTHER): Payer: PPO | Admitting: Adult Health

## 2019-01-01 ENCOUNTER — Other Ambulatory Visit: Payer: Self-pay

## 2019-01-01 ENCOUNTER — Encounter: Payer: Self-pay | Admitting: Adult Health

## 2019-01-01 VITALS — HR 73 | Temp 98.4°F | Ht 70.25 in | Wt 225.0 lb

## 2019-01-01 DIAGNOSIS — Z0000 Encounter for general adult medical examination without abnormal findings: Secondary | ICD-10-CM | POA: Insufficient documentation

## 2019-01-01 NOTE — Progress Notes (Addendum)
Virtual Visit via Telephone Note  I connected with Cindy Rogers  by telephone and verified that I am speaking with the correct person using two identifiers.   I discussed the limitations, risks, security and privacy concerns of performing an evaluation and management service by telephone and the availability of in person appointments.The staff discussed with the patient that there may be a patient responsible charge related to this service. The patient expressed understanding and agreed to proceed.  Subjective:   Cindy Rogers is a 68 y.o. female who presents for Medicare Annual (Subsequent) preventive examination.  Review of Systems: General:   No F/C, wt loss Pulm:   No DIB, SOB, pleuritic chest pain Card:  No CP, palpitations Abd:  No n/v/d or pain Ext:  No inc edema from baseline       Objective:     Vitals: Pulse 73   Temp 98.4 F (36.9 C) (Oral)   Ht 5' 10.25" (1.784 m)   Wt 225 lb (102.1 kg)   BMI 32.05 kg/m   Body mass index is 32.05 kg/m.  Advanced Directives 09/30/2018 01/26/2017 12/10/2011  Does Patient Have a Medical Advance Directive? No Yes Patient has advance directive, copy not in chart  Type of Advance Directive - - Living will  Would patient like information on creating a medical advance directive? No - Patient declined - -    Tobacco Social History   Tobacco Use  Smoking Status Never Smoker  Smokeless Tobacco Never Used     Counseling given: Not Answered   Past Medical History:  Diagnosis Date  . Asthma   . GERD (gastroesophageal reflux disease)   . Headache(784.0)   . Pneumonia   . PONV (postoperative nausea and vomiting)   . Shortness of breath    Past Surgical History:  Procedure Laterality Date  . ABDOMINAL HYSTERECTOMY    . CHOLECYSTECTOMY    . RHINOPLASTY     Family History  Problem Relation Age of Onset  . Emphysema Father        smoked  . Heart disease Father   . Kidney cancer Father        with mets to liver and retina  .  Heart disease Mother    Social History   Socioeconomic History  . Marital status: Married    Spouse name: Not on file  . Number of children: Not on file  . Years of education: Not on file  . Highest education level: Not on file  Occupational History  . Occupation: Retired Associate Professor: [employer]  . Financial resource strain: Not on file  . Food insecurity:    Worry: Not on file    Inability: Not on file  . Transportation needs:    Medical: Not on file    Non-medical: Not on file  Tobacco Use  . Smoking status: Never Smoker  . Smokeless tobacco: Never Used  Substance and Sexual Activity  . Alcohol use: Yes    Comment: glass of wine approx twice per wk  . Drug use: No  . Sexual activity: Yes    Birth control/protection: Post-menopausal  Lifestyle  . Physical activity:    Days per week: Not on file    Minutes per session: Not on file  . Stress: Not on file  Relationships  . Social connections:    Talks on phone: Not on file    Gets together: Not on file    Attends  religious service: Not on file    Active member of club or organization: Not on file    Attends meetings of clubs or organizations: Not on file    Relationship status: Not on file  Other Topics Concern  . Not on file  Social History Narrative  . Not on file    Outpatient Encounter Medications as of 01/01/2019  Medication Sig  . albuterol (PROAIR HFA) 108 (90 Base) MCG/ACT inhaler 2 puffs every 4 hours as needed only  if your can't catch your breath  . budesonide-formoterol (SYMBICORT) 80-4.5 MCG/ACT inhaler Inhale 2 puffs into the lungs 2 (two) times daily.  . calcium-vitamin D (OSCAL WITH D) 500-200 MG-UNIT per tablet Take 1 tablet by mouth daily.  . cholecalciferol (VITAMIN D) 1000 UNITS tablet Take 2,000 Units by mouth daily.   Marland Kitchen esomeprazole (NEXIUM) 20 MG capsule Take 20 mg by mouth daily at 12 noon.  . famotidine (PEPCID) 20 MG tablet Take by mouth one at bedtime  .  fluticasone (FLONASE) 50 MCG/ACT nasal spray Place 2 sprays into the nose daily as needed. For allergies  . ibuprofen (ADVIL,MOTRIN) 200 MG tablet Take 600 mg by mouth every 6 (six) hours as needed. For pain  . montelukast (SINGULAIR) 10 MG tablet Take 1 tablet (10 mg total) by mouth at bedtime.  . [DISCONTINUED] doxycycline (VIBRA-TABS) 100 MG tablet Take 1 tablet (100 mg total) by mouth 2 (two) times daily.  . [DISCONTINUED] predniSONE (STERAPRED UNI-PAK 21 TAB) 10 MG (21) TBPK tablet As directed   No facility-administered encounter medications on file as of 01/01/2019.     Activities of Daily Living In your present state of health, do you have any difficulty performing the following activities: 01/01/2019 09/11/2018  Hearing? N N  Vision? N N  Difficulty concentrating or making decisions? N N  Walking or climbing stairs? N N  Dressing or bathing? N N  Doing errands, shopping? N N  Some recent data might be hidden    Patient Care Team: Esaw Grandchild, NP as PCP - General (Family Medicine) Tanda Rockers, MD as Consulting Physician (Pulmonary Disease) Berle Mull, MD as Consulting Physician (Sports Medicine) Sydnee Levans, MD as Consulting Physician (Dermatology) Darleen Crocker, MD as Consulting Physician (Ophthalmology)    Assessment:   This is a routine wellness examination for [place].  Exercise Activities and Dietary recommendations Once knee pain resolves (over-use injury) resume yoga and boot camp Encouraged to walk daily at leas at least 15 mins  Fall Risk Fall Risk  01/01/2019 09/11/2018 09/01/2017 03/02/2017  Falls in the past year? 1 1 No No  Number falls in past yr: 0 1 - -  Injury with Fall? 1 1 - -   Is the patient's home free of loose throw rugs in walkways, pet beds, electrical cords, etc?   yes      Grab bars in the bathroom? yes      Handrails on the stairs?   yes      Adequate lighting?   yes  Timed Get Up and Go performed: N/A, OV not performed in  clinic  Depression Screen PHQ 2/9 Scores 01/01/2019 09/11/2018 08/14/2018 09/12/2017  PHQ - 2 Score 1 0 1 1  PHQ- 9 Score 2 4 4 5      Cognitive Function- WNL     6CIT Screen 01/01/2019 03/02/2017  What Year? 0 points 0 points  What month? 0 points 0 points  What time? 0 points 0 points  Count back  from 20 0 points 0 points  Months in reverse 0 points 0 points  Repeat phrase 2 points 0 points  Total Score 2 0    Immunization History  Administered Date(s) Administered  . Influenza Split 06/13/2013, 06/13/2014  . Influenza Whole 06/13/2016  . Influenza, High Dose Seasonal PF 06/22/2018  . Influenza-Unspecified 05/26/2017  . Pneumococcal Conjugate-13 02/04/2016  . Pneumococcal Polysaccharide-23 04/21/2011  . Tdap 03/27/2009, 09/11/2018  . Zoster 08/02/2011  . Zoster Recombinat (Shingrix) 03/08/2017, 05/26/2017    Qualifies for Shingles Vaccine?UTD  Screening Tests Health Maintenance  Topic Date Due  . INFLUENZA VACCINE  04/14/2019  . COLONOSCOPY  03/10/2020  . MAMMOGRAM  06/12/2020  . TETANUS/TDAP  09/11/2028  . DEXA SCAN  Completed  . Hepatitis C Screening  Completed  . PNA vac Low Risk Adult  Discontinued    Cancer Screenings: Lung: Low Dose CT Chest recommended if Age 46-80 years, 30 pack-year currently smoking OR have quit w/in 15years. Patient does not qualify. Breast:  Up to date on Mammogram? Yes   Up to date of Bone Density/Dexa? Yes Colorectal: UTD, last 03/10/2010  Additional Screenings: : Hepatitis C Screening: UTD     Plan:   Continue all medications as directed Once knee pain resolves (over-use injury) resume yoga and boot camp Reduce daily snacking-keep to one snack in afternoon and use small bowl for portion control Continue to follow Stay at Home order per COVID-19 guidance  I have personally reviewed and noted the following in the patient's chart:   . Medical and social history . Use of alcohol, tobacco or illicit drugs  . Current  medications and supplements . Functional ability and status . Nutritional status . Physical activity . Advanced directives . List of other physicians . Hospitalizations, surgeries, and ER visits in previous 12 months . Vitals . Screenings to include cognitive, depression, and falls . Referrals and appointments   I discussed the assessment and treatment plan with the patient. The patient was provided an opportunity to ask questions and all were answered. The patient agreed with the plan and demonstrated an understanding of the instructions.   The patient was advised to call back or seek an in-person evaluation if the symptoms worsen or if the condition fails to improve as anticipated.  I provided 15 minutes of non-face-to-face time during this encounter.   Esaw Grandchild, NP  01/03/2019

## 2019-01-01 NOTE — Assessment & Plan Note (Signed)
Continue all medications as directed Once knee pain resolves (over-use injury) resume yoga and boot camp Reduce daily snacking-keep to one snack in afternoon and use small bowl for portion control Continue to follow Stay at Home order per COVID-19 guidance  I have personally reviewed and noted the following in the patient's chart:   . Medical and social history . Use of alcohol, tobacco or illicit drugs  . Current medications and supplements . Functional ability and status . Nutritional status . Physical activity . Advanced directives . List of other physicians . Hospitalizations, surgeries, and ER visits in previous 12 months . Vitals . Screenings to include cognitive, depression, and falls . Referrals and appointments   I discussed the assessment and treatment plan with the patient. The patient was provided an opportunity to ask questions and all were answered. The patient agreed with the plan and demonstrated an understanding of the instructions.   The patient was advised to call back or seek an in-person evaluation if the symptoms worsen or if the condition fails to improve as anticipated.

## 2019-01-15 DIAGNOSIS — S83282A Other tear of lateral meniscus, current injury, left knee, initial encounter: Secondary | ICD-10-CM | POA: Diagnosis not present

## 2019-02-20 DIAGNOSIS — S83282A Other tear of lateral meniscus, current injury, left knee, initial encounter: Secondary | ICD-10-CM | POA: Diagnosis not present

## 2019-02-20 DIAGNOSIS — S83241A Other tear of medial meniscus, current injury, right knee, initial encounter: Secondary | ICD-10-CM | POA: Diagnosis not present

## 2019-02-24 DIAGNOSIS — M25561 Pain in right knee: Secondary | ICD-10-CM | POA: Diagnosis not present

## 2019-02-24 DIAGNOSIS — M25562 Pain in left knee: Secondary | ICD-10-CM | POA: Diagnosis not present

## 2019-02-27 DIAGNOSIS — M170 Bilateral primary osteoarthritis of knee: Secondary | ICD-10-CM | POA: Diagnosis not present

## 2019-03-05 NOTE — Progress Notes (Signed)
Greater than 5 minutes, yet less than 10 minutes of time have been spent researching, coordinating, and implementing care for this patient today.  Thank you for the details you included in the comment boxes. Those details are very helpful in determining the best course of treatment for you and help us to provide the best care.  

## 2019-03-12 ENCOUNTER — Other Ambulatory Visit: Payer: PPO

## 2019-04-09 ENCOUNTER — Other Ambulatory Visit: Payer: PPO

## 2019-04-09 ENCOUNTER — Other Ambulatory Visit: Payer: Self-pay

## 2019-04-09 DIAGNOSIS — E7800 Pure hypercholesterolemia, unspecified: Secondary | ICD-10-CM

## 2019-04-10 LAB — LIPID PANEL
Chol/HDL Ratio: 4 ratio (ref 0.0–4.4)
Cholesterol, Total: 206 mg/dL — ABNORMAL HIGH (ref 100–199)
HDL: 51 mg/dL (ref >39)
LDL Calculated: 140 mg/dL — ABNORMAL HIGH (ref 0–99)
Triglycerides: 75 mg/dL (ref 0–149)
VLDL Cholesterol Cal: 15 mg/dL (ref 5–40)

## 2019-04-11 ENCOUNTER — Other Ambulatory Visit: Payer: Self-pay | Admitting: Adult Health

## 2019-04-11 DIAGNOSIS — Z79899 Other long term (current) drug therapy: Secondary | ICD-10-CM

## 2019-04-11 MED ORDER — ATORVASTATIN CALCIUM 20 MG PO TABS: ORAL_TABLET | ORAL | 0 refills | Status: DC

## 2019-04-11 NOTE — Progress Notes (Signed)
Started on Atorvastatin 20mg  once weekly- will not agree to QD

## 2019-05-31 ENCOUNTER — Other Ambulatory Visit: Payer: Self-pay

## 2019-05-31 ENCOUNTER — Other Ambulatory Visit (INDEPENDENT_AMBULATORY_CARE_PROVIDER_SITE_OTHER): Payer: PPO

## 2019-05-31 DIAGNOSIS — Z23 Encounter for immunization: Secondary | ICD-10-CM | POA: Diagnosis not present

## 2019-05-31 DIAGNOSIS — Z79899 Other long term (current) drug therapy: Secondary | ICD-10-CM

## 2019-05-31 NOTE — Progress Notes (Signed)
Pt here for influenza vaccine.  Screening questionnaire reviewed, VIS provided to patient, and any/all patient questions answered.  T. Keneth Borg, CMA  

## 2019-05-31 NOTE — Addendum Note (Signed)
Addended by: Fonnie Mu on: 05/31/2019 09:28 AM   Modules accepted: Orders

## 2019-06-01 LAB — HEPATIC FUNCTION PANEL
ALT: 12 IU/L (ref 0–32)
AST: 11 IU/L (ref 0–40)
Albumin: 4.4 g/dL (ref 3.8–4.8)
Alkaline Phosphatase: 54 IU/L (ref 39–117)
Bilirubin Total: 0.5 mg/dL (ref 0.0–1.2)
Bilirubin, Direct: 0.14 mg/dL (ref 0.00–0.40)
Total Protein: 6.2 g/dL (ref 6.0–8.5)

## 2019-06-02 ENCOUNTER — Encounter: Payer: Self-pay | Admitting: Adult Health

## 2019-06-18 DIAGNOSIS — Z1231 Encounter for screening mammogram for malignant neoplasm of breast: Secondary | ICD-10-CM | POA: Diagnosis not present

## 2019-06-18 LAB — HM MAMMOGRAPHY

## 2019-06-21 ENCOUNTER — Telehealth: Payer: Self-pay | Admitting: Adult Health

## 2019-06-21 NOTE — Telephone Encounter (Signed)
Patient called, stating she took a fall & wanted her PCP Valetta Fuller D. ) to take a look at her injuries. Since Valetta Fuller is out of office today & tomorrow, I recommended her to go be checked out at urgent care and call us back if she still needs [place].

## 2019-06-22 ENCOUNTER — Ambulatory Visit
Admission: EM | Admit: 2019-06-22 | Discharge: 2019-06-22 | Disposition: A | Discharge status: Home / Self Care | Payer: PPO | Attending: Emergency Medicine | Admitting: Emergency Medicine

## 2019-06-22 ENCOUNTER — Encounter: Payer: Self-pay | Admitting: Emergency Medicine

## 2019-06-22 ENCOUNTER — Other Ambulatory Visit: Payer: Self-pay

## 2019-06-22 ENCOUNTER — Ambulatory Visit (INDEPENDENT_AMBULATORY_CARE_PROVIDER_SITE_OTHER): Payer: PPO

## 2019-06-22 DIAGNOSIS — R0781 Pleurodynia: Secondary | ICD-10-CM | POA: Diagnosis not present

## 2019-06-22 DIAGNOSIS — S63432A Traumatic rupture of volar plate of right middle finger at metacarpophalangeal and interphalangeal joint, initial encounter: Secondary | ICD-10-CM | POA: Diagnosis not present

## 2019-06-22 DIAGNOSIS — S63639A Sprain of interphalangeal joint of unspecified finger, initial encounter: Secondary | ICD-10-CM

## 2019-06-22 DIAGNOSIS — S299XXA Unspecified injury of thorax, initial encounter: Secondary | ICD-10-CM | POA: Diagnosis not present

## 2019-06-22 DIAGNOSIS — W010XXA Fall on same level from slipping, tripping and stumbling without subsequent striking against object, initial encounter: Secondary | ICD-10-CM | POA: Diagnosis not present

## 2019-06-22 DIAGNOSIS — R079 Chest pain, unspecified: Secondary | ICD-10-CM | POA: Diagnosis not present

## 2019-06-22 NOTE — Discharge Instructions (Signed)
Take two 220 mg tabs of naproxen (Aleve) twice daily with food to combat inflammation and pain. Ice after exercises/prolonged use. Follow-up with hand orthopedist for further evaluation/management if needed. Return for worsening pain, swelling, redness, warmth, numbness.

## 2019-06-22 NOTE — ED Notes (Signed)
Patient able to ambulate independently  

## 2019-06-22 NOTE — ED Triage Notes (Signed)
Patient presents to [facility] after a fall from standing on grass on Tuesday of this week.  Patient states she fell on to right hand then tucked her elbow in and slammed into her ribs with her elbow.  Patient states she is still having pain with respirations to the upper right chest and lower right chest since the fall.  Ibuprofen at home which only dulls the pain.  Finger has resolving bruising noted.

## 2019-06-22 NOTE — ED Provider Notes (Signed)
[facility]    CSN: [id number] Arrival date & time: 06/22/19  1004      History   Chief Complaint Chief Complaint  Patient presents with  . Chest Pain    HPI YUDI PELCZAR is a 68 y.o. female right-sided rib pain, right third digit pain after fall walking up a grassy hill on Tuesday when golfing.  Patient states that she fell onto her intact her elbow which symptoms into her ribs.  Patient has had trauma, loss of consciousness.  Denies lightheadedness, shortness of breath.  Patient does have chest pain with deep inspiration worse near her lower right costal margin.  Patient states ibuprofen helps alleviate.  Patient also notes that her right third digit finger swelling has improved, and she has maintained full range of motion since immediately after accident.   Past Medical History:  Diagnosis Date  . Asthma   . GERD (gastroesophageal reflux disease)   . Headache(784.0)   . Pneumonia   . PONV (postoperative nausea and vomiting)   . Shortness of breath     Patient Active Problem List   Diagnosis Date Noted  . Encounter for Medicare annual wellness exam 01/01/2019  . Elevated LDL cholesterol level 09/11/2018  . Elevated TSH 08/30/2018  . Fever 09/12/2017  . Acute otitis media 09/12/2017  . Vitamin D deficiency 05/02/2017  . Healthcare maintenance 05/02/2017  . Elevated cholesterol 03/03/2017  . Medicare annual wellness visit, subsequent 03/02/2017  . Chronic idiopathic constipation 01/26/2017  . Bronchitis 01/26/2017  . Osteoporosis of femur without pathological fracture 05/10/2014  . Cough variant asthma 10/13/2013  . Intrinsic asthma 12/10/2011    Past Surgical History:  Procedure Laterality Date  . ABDOMINAL HYSTERECTOMY    . CHOLECYSTECTOMY    . RHINOPLASTY      OB History   No obstetric history on file.      Home Medications    Prior to Admission medications   Medication Sig Start Date End Date Taking? Authorizing Provider  albuterol  (PROAIR HFA) 108 (90 Base) MCG/ACT inhaler 2 puffs every 4 hours as needed only  if your can't catch your breath 08/14/18   [place]ford, Valetta Fuller D, NP  atorvastatin (LIPITOR) 20 MG tablet Take one tablet by mouth once weekly 04/11/19   Danford, Valetta Fuller D, NP  budesonide-formoterol (SYMBICORT) 80-4.5 MCG/ACT inhaler Inhale 2 puffs into the lungs 2 (two) times daily. 09/11/18   Danford, Valetta Fuller D, NP  calcium-vitamin D (OSCAL WITH D) 500-200 MG-UNIT per tablet Take 1 tablet by mouth daily.    [provider]  cholecalciferol (VITAMIN D) 1000 UNITS tablet Take 2,000 Units by mouth daily.     [provider]  esomeprazole (NEXIUM) 20 MG capsule Take 20 mg by mouth daily at 12 noon.    [provider]  famotidine (PEPCID) 20 MG tablet Take by mouth one at bedtime    [provider]  fluticasone (FLONASE) 50 MCG/ACT nasal spray Place 2 sprays into the nose daily as needed. For allergies    [provider]  ibuprofen (ADVIL,MOTRIN) 200 MG tablet Take 600 mg by mouth every 6 (six) hours as needed. For pain    [provider]  montelukast (SINGULAIR) 10 MG tablet Take 1 tablet (10 mg total) by mouth at bedtime. 09/11/18   Esaw Grandchild, NP    Family History Family History  Problem Relation Age of Onset  . Emphysema Father        smoked  . Heart disease  Father   . Kidney cancer Father        with mets to liver and retina  . Heart disease Mother     Social History Social History   Tobacco Use  . Smoking status: Never Smoker  . Smokeless tobacco: Never Used  Substance Use Topics  . Alcohol use: Yes    Comment: glass of wine approx twice per wk  . Drug use: No     Allergies   Food, Shellfish allergy, Codeine, Penicillins, Sulfa antibiotics, and Neosporin af [miconazole]   Review of Systems Review of Systems  Constitutional: Negative for fatigue and fever.  Respiratory: Negative for cough and shortness of breath.   Cardiovascular: Negative for  chest pain and palpitations.  Musculoskeletal:       Positive for right rib pain, right third digit pain/swelling  Neurological: Negative for weakness and numbness.     Physical Exam Triage Vital Signs ED Triage Vitals  Enc Vitals Group     BP 06/22/19 1012 124/88     Pulse Rate 06/22/19 1012 84     Resp 06/22/19 1012 16     Temp 06/22/19 1012 99.2 F (37.3 C)     Temp Source 06/22/19 1012 Oral     SpO2 06/22/19 1012 97 %     Weight --      Height --      Head Circumference --      [place] Flow --      Pain Score 06/22/19 1020 5     Pain Loc --      Pain Edu? --      Excl. in [place]? --    No data found.  Updated Vital Signs BP 124/88 (BP Location: Left Arm)   Pulse 84   Temp 99.2 F (37.3 C) (Oral)   Resp 16   SpO2 97%   Visual Acuity Right Eye Distance:   Left Eye Distance:   Bilateral Distance:    Right Eye Near:   Left Eye Near:    Bilateral Near:     Physical Exam Constitutional:      General: She is not in acute distress.    Appearance: She is not ill-appearing.  HENT:     Head: Normocephalic and atraumatic.     Mouth/Throat:     Mouth: Mucous membranes are moist.  Eyes:     General: No scleral icterus.    Conjunctiva/sclera: Conjunctivae normal.     Pupils: Pupils are equal, round, and reactive to light.  Cardiovascular:     Rate and Rhythm: Normal rate and regular rhythm.  Pulmonary:     Effort: Pulmonary effort is normal. No respiratory distress.     Breath sounds: No wheezing.  Chest:     Comments: Right rib cage without obvious deformity, bruising, swelling, erythema or warmth.  No crepitus to palpation, the patient is tender over lower lateral costal margins.   Musculoskeletal:     Comments: Right third digit with mild to moderate edema and yellowing from resolving bruising, more focal over PIP which is tender to palpation.  Full active ROM, decreased grip strength second to pain.  Neurovascularly intact.  Skin:    Coloration: Skin is not  jaundiced or pale.  Neurological:     General: No focal deficit present.     Mental Status: She is alert and oriented to person, place, and time.      UC Treatments / Results  Labs (all labs ordered are listed, but only  abnormal results are displayed) Labs Reviewed - No data to display  EKG   Radiology Dg Ribs Unilateral W/chest Right  Result Date: 06/22/2019 CLINICAL DATA:  [place] 3 days ago.  Persistent chest pain. EXAM: RIGHT RIBS AND CHEST - 3+ VIEW COMPARISON:  Chest x-ray from 2016. FINDINGS: The cardiac silhouette, mediastinal and hilar contours are within normal limits and stable. Mild tortuosity of the thoracic aorta. The lungs are clear of an acute process. Minimal streaky left basilar scarring changes are again noted. No pneumothorax or pleural effusion. Dedicated views of the right ribs do not demonstrate any definite acute right-sided rib fractures. IMPRESSION: 1. No acute cardiopulmonary findings. 2. No definite acute right rib fractures. Electronically Signed   By: Marijo Sanes M.D.   On: 06/22/2019 11:21   Dg Hand Complete Right  Result Date: 06/22/2019 CLINICAL DATA:  [place] 3 days ago. Injured right hand. Persistent pain. EXAM: RIGHT HAND - COMPLETE 3+ VIEW COMPARISON:  None. FINDINGS: The joint spaces are fairly well maintained. There are advanced degenerative changes noted at the DIP joint of the fifth finger. On the lateral film there are 2 small bony densities noted along the volar aspect of the third PIP joint suggesting volar plate avulsion fractures. IMPRESSION: Small volar plate avulsion fractures at the PIP joint of the long finger. Electronically Signed   By: Marijo Sanes M.D.   On: 06/22/2019 11:25    Procedures Procedures (including critical care time)  Medications Ordered in UC Medications - No data to display  Initial Impression / Assessment and Plan / UC Course  I have reviewed the triage vital signs and the nursing notes.  Pertinent labs & imaging  results that were available during my care of the patient were reviewed by me and considered in my medical decision making (see chart for details).     1.  Right rib pain X-ray obtained in office, reviewed by me and radiology, compared to 2016 reading: No acute cardiopulmonary findings or acute right rib fractures and there is no evidence of pneumothorax or pleural effusion.  2.  Volar plate injury of right finger X-ray of right hand in office, reviewed by me radiology: Small volar plate avulsion fraction of PIP joint of third digit.  This provider buddy taped patient's third digit to her fourth digit which patient tolerated well.  Will continue RICE, follow-up with hand Ortho if needed next week.  Return precautions discussed, patient verbalized understanding and is agreeable to plan. Final Clinical Impressions(s) / UC Diagnoses   Final diagnoses:  Volar plate injury of finger, initial encounter  Rib pain on right side     Discharge Instructions     Take two 220 mg tabs of naproxen (Aleve) twice daily with food to combat inflammation and pain. Ice after exercises/prolonged use. Follow-up with hand orthopedist for further evaluation/management if needed. Return for worsening pain, swelling, redness, warmth, numbness.    ED Prescriptions    None     PDMP not reviewed this encounter.   Neldon Mc [place], [place] 06/23/19 [phone]

## 2019-07-03 ENCOUNTER — Encounter: Payer: Self-pay | Admitting: Adult Health

## 2019-07-04 ENCOUNTER — Other Ambulatory Visit: Payer: Self-pay

## 2019-07-04 ENCOUNTER — Other Ambulatory Visit: Payer: PPO

## 2019-07-04 DIAGNOSIS — E7800 Pure hypercholesterolemia, unspecified: Secondary | ICD-10-CM

## 2019-07-07 LAB — LIPID PANEL
Chol/HDL Ratio: 3.1 ratio (ref 0.0–4.4)
Cholesterol, Total: 157 mg/dL (ref 100–199)
HDL: 50 mg/dL (ref >39)
LDL Chol Calc (NIH): 92 mg/dL (ref 0–99)
Triglycerides: 81 mg/dL (ref 0–149)
VLDL Cholesterol Cal: 15 mg/dL (ref 5–40)

## 2019-07-09 ENCOUNTER — Other Ambulatory Visit: Payer: Self-pay

## 2019-07-09 ENCOUNTER — Encounter: Payer: Self-pay | Admitting: Adult Health

## 2019-07-09 MED ORDER — ATORVASTATIN CALCIUM 20 MG PO TABS: ORAL_TABLET | ORAL | 0 refills | Status: DC

## 2019-09-24 ENCOUNTER — Telehealth: Payer: Self-pay

## 2019-09-24 ENCOUNTER — Encounter: Payer: Self-pay | Admitting: Adult Health

## 2019-09-24 DIAGNOSIS — M1712 Unilateral primary osteoarthritis, left knee: Secondary | ICD-10-CM | POA: Diagnosis not present

## 2019-09-24 NOTE — Telephone Encounter (Signed)
Patient Message Open   09/24/2019 [facility], Berna Spare, NP Family Medicine  Conversation: Non-Urgent Medical Question (Newest Message First) Me to [name], [name]      09/24/19 11:45 AM Does the pt need labs repeated at this time?  I am unsure when the pt is to have a follow up visit.  Please advise and I will address the refill requests as well.   This MyChart message has not been read. Genia Hotter to [name], NP      09/24/19 11:03 AM I have just finished my prescription for the Atorvastatin 20mg  that I take once a week. Do I need to have another cholesterol check or can you just call in a refill to [facility]? Also, I need new prescriptions sent to [facility] for my Symbicort 80/4.5 and Montelukast 10mg .  Thanks  Cindy Rogers 1951-04-14

## 2019-09-24 NOTE — Telephone Encounter (Signed)
Ms. Ferreiro needs full set of fasting labs- then will refill meds Thanks! Valetta Fuller

## 2019-09-24 NOTE — Telephone Encounter (Signed)
MyChart message sent to pt.  T. Cashlyn Huguley, CMA 

## 2019-09-26 ENCOUNTER — Other Ambulatory Visit: Payer: Self-pay

## 2019-09-26 DIAGNOSIS — E7800 Pure hypercholesterolemia, unspecified: Secondary | ICD-10-CM

## 2019-09-26 DIAGNOSIS — E559 Vitamin D deficiency, unspecified: Secondary | ICD-10-CM

## 2019-09-26 DIAGNOSIS — R7989 Other specified abnormal findings of blood chemistry: Secondary | ICD-10-CM

## 2019-09-26 DIAGNOSIS — Z0000 Encounter for general adult medical examination without abnormal findings: Secondary | ICD-10-CM

## 2019-09-27 ENCOUNTER — Other Ambulatory Visit: Payer: Self-pay

## 2019-09-27 ENCOUNTER — Other Ambulatory Visit: Payer: PPO

## 2019-09-27 DIAGNOSIS — E559 Vitamin D deficiency, unspecified: Secondary | ICD-10-CM | POA: Diagnosis not present

## 2019-09-27 DIAGNOSIS — R7989 Other specified abnormal findings of blood chemistry: Secondary | ICD-10-CM | POA: Diagnosis not present

## 2019-09-27 DIAGNOSIS — E7800 Pure hypercholesterolemia, unspecified: Secondary | ICD-10-CM

## 2019-09-27 DIAGNOSIS — Z0000 Encounter for general adult medical examination without abnormal findings: Secondary | ICD-10-CM

## 2019-09-28 ENCOUNTER — Encounter: Payer: Self-pay | Admitting: Adult Health

## 2019-09-28 ENCOUNTER — Other Ambulatory Visit: Payer: Self-pay | Admitting: Adult Health

## 2019-09-28 LAB — LIPID PANEL
Chol/HDL Ratio: 3.1 ratio (ref 0.0–4.4)
Cholesterol, Total: 174 mg/dL (ref 100–199)
HDL: 57 mg/dL (ref >39)
LDL Chol Calc (NIH): 104 mg/dL — ABNORMAL HIGH (ref 0–99)
Triglycerides: 66 mg/dL (ref 0–149)
VLDL Cholesterol Cal: 13 mg/dL (ref 5–40)

## 2019-09-28 LAB — CBC WITH DIFFERENTIAL/PLATELET
Basophils Absolute: 0.1 10*3/uL (ref 0.0–0.2)
Basos: 1 %
EOS (ABSOLUTE): 0.1 10*3/uL (ref 0.0–0.4)
Eos: 3 %
Hematocrit: 41.9 % (ref 34.0–46.6)
Hemoglobin: 13.9 g/dL (ref 11.1–15.9)
Immature Grans (Abs): 0 10*3/uL (ref 0.0–0.1)
Immature Granulocytes: 0 %
Lymphocytes Absolute: 1.5 10*3/uL (ref 0.7–3.1)
Lymphs: 32 %
MCH: 30.3 pg (ref 26.6–33.0)
MCHC: 33.2 g/dL (ref 31.5–35.7)
MCV: 91 fL (ref 79–97)
Monocytes Absolute: 0.4 10*3/uL (ref 0.1–0.9)
Monocytes: 9 %
Neutrophils Absolute: 2.5 10*3/uL (ref 1.4–7.0)
Neutrophils: 55 %
Platelets: 179 10*3/uL (ref 150–450)
RBC: 4.59 x10E6/uL (ref 3.77–5.28)
RDW: 12.4 % (ref 11.7–15.4)
WBC: 4.6 10*3/uL (ref 3.4–10.8)

## 2019-09-28 LAB — TSH: TSH: 3.05 u[IU]/mL (ref 0.450–4.500)

## 2019-09-28 LAB — COMPREHENSIVE METABOLIC PANEL
ALT: 12 IU/L (ref 0–32)
AST: 14 IU/L (ref 0–40)
Albumin/Globulin Ratio: 2.1 (ref 1.2–2.2)
Albumin: 4.5 g/dL (ref 3.8–4.8)
Alkaline Phosphatase: 62 IU/L (ref 39–117)
BUN/Creatinine Ratio: 26 (ref 12–28)
BUN: 20 mg/dL (ref 8–27)
Bilirubin Total: 0.5 mg/dL (ref 0.0–1.2)
CO2: 24 mmol/L (ref 20–29)
Calcium: 10.4 mg/dL — ABNORMAL HIGH (ref 8.7–10.3)
Chloride: 105 mmol/L (ref 96–106)
Creatinine, Ser: 0.77 mg/dL (ref 0.57–1.00)
GFR calc Af Amer: 92 mL/min/{1.73_m2} (ref >59)
GFR calc non Af Amer: 80 mL/min/{1.73_m2} (ref >59)
Globulin, Total: 2.1 g/dL (ref 1.5–4.5)
Glucose: 85 mg/dL (ref 65–99)
Potassium: 4.5 mmol/L (ref 3.5–5.2)
Sodium: 142 mmol/L (ref 134–144)
Total Protein: 6.6 g/dL (ref 6.0–8.5)

## 2019-09-28 LAB — VITAMIN D 25 HYDROXY (VIT D DEFICIENCY, FRACTURES): Vit D, 25-Hydroxy: 40.1 ng/mL (ref 30.0–100.0)

## 2019-09-28 LAB — HEMOGLOBIN A1C
Est. average glucose Bld gHb Est-mCnc: 103 mg/dL
Hgb A1c MFr Bld: 5.2 % (ref 4.8–5.6)

## 2019-09-28 MED ORDER — BUDESONIDE-FORMOTEROL FUMARATE 80-4.5 MCG/ACT IN AERO: 2.0000 | INHALATION_SPRAY | Freq: Two times a day (BID) | RESPIRATORY_TRACT | 0 refills | Status: DC

## 2019-09-28 MED ORDER — MONTELUKAST SODIUM 10 MG PO TABS: 10.0000 mg | ORAL_TABLET | Freq: Every day | ORAL | 0 refills | Status: DC

## 2019-10-01 DIAGNOSIS — M1712 Unilateral primary osteoarthritis, left knee: Secondary | ICD-10-CM | POA: Diagnosis not present

## 2019-10-08 DIAGNOSIS — M1712 Unilateral primary osteoarthritis, left knee: Secondary | ICD-10-CM | POA: Diagnosis not present

## 2019-10-14 ENCOUNTER — Ambulatory Visit: Payer: PPO

## 2019-10-19 ENCOUNTER — Ambulatory Visit: Payer: PPO

## 2019-10-19 ENCOUNTER — Ambulatory Visit: Payer: PPO | Attending: Internal Medicine

## 2019-10-19 DIAGNOSIS — Z23 Encounter for immunization: Secondary | ICD-10-CM

## 2019-10-19 NOTE — Progress Notes (Signed)
   Covid-19 Vaccination Clinic  Name:  Cindy Rogers    MRN: [id number] DOB: [birth year]  10/19/2019  [name] was observed post Covid-19 immunization for 15 minutes without incidence. She was provided with Vaccine Information Sheet and instruction to access the V-Safe system.   [name] was instructed to call 911 with any severe reactions post vaccine: Marland Kitchen Difficulty breathing  . Swelling of your face and throat  . A fast heartbeat  . A bad rash all over your body  . Dizziness and weakness    Immunizations Administered    Name Date Dose VIS Date Route   Pfizer COVID-19 Vaccine 10/19/2019  9:18 AM 0.3 mL 08/24/2019 Intramuscular   Manufacturer: [place]   Lot: [id number]   [place]: [id number]

## 2019-11-08 ENCOUNTER — Ambulatory Visit: Payer: PPO | Attending: Internal Medicine

## 2019-11-08 DIAGNOSIS — Z20822 Contact with and (suspected) exposure to covid-19: Secondary | ICD-10-CM | POA: Diagnosis not present

## 2019-11-09 LAB — NOVEL CORONAVIRUS, NAA: SARS-CoV-2, NAA: NOT DETECTED

## 2019-11-13 ENCOUNTER — Ambulatory Visit: Payer: PPO | Attending: Internal Medicine

## 2019-11-13 DIAGNOSIS — Z23 Encounter for immunization: Secondary | ICD-10-CM | POA: Insufficient documentation

## 2019-11-13 NOTE — Progress Notes (Signed)
   Covid-19 Vaccination Clinic  Name:  Cindy Rogers    MRN: [id number] DOB: 04-Dec-1950  11/13/2019  Cindy Rogers was observed post Covid-19 immunization for 15 minutes without incident. She was provided with Vaccine Information Sheet and instruction to access the V-Safe system.   Cindy Rogers was instructed to call 911 with any severe reactions post vaccine: Marland Kitchen Difficulty breathing  . Swelling of face and throat  . A fast heartbeat  . A bad rash all over body  . Dizziness and weakness   Immunizations Administered    Name Date Dose VIS Date Route   Pfizer COVID-19 Vaccine 11/13/2019 10:05 AM 0.3 mL 08/24/2019 Intramuscular   Manufacturer: [place]   Lot: [id number]   [place]: [id number]

## 2019-12-19 ENCOUNTER — Other Ambulatory Visit: Payer: Self-pay | Admitting: Adult Health

## 2019-12-19 ENCOUNTER — Other Ambulatory Visit: Payer: Self-pay | Admitting: Family Medicine

## 2019-12-27 ENCOUNTER — Other Ambulatory Visit: Payer: Self-pay | Admitting: Adult Health

## 2019-12-27 ENCOUNTER — Telehealth: Payer: Self-pay

## 2019-12-27 NOTE — Telephone Encounter (Signed)
Please call pt to schedule appt.  No further refills until pt is seen.  T. Kita Neace, CMA  

## 2020-02-06 ENCOUNTER — Ambulatory Visit
Admission: EM | Admit: 2020-02-06 | Discharge: 2020-02-06 | Disposition: A | Discharge status: Home / Self Care | Payer: PPO | Attending: Emergency Medicine | Admitting: Emergency Medicine

## 2020-02-06 ENCOUNTER — Other Ambulatory Visit: Payer: Self-pay

## 2020-02-06 ENCOUNTER — Ambulatory Visit (INDEPENDENT_AMBULATORY_CARE_PROVIDER_SITE_OTHER): Payer: PPO

## 2020-02-06 DIAGNOSIS — R05 Cough: Secondary | ICD-10-CM

## 2020-02-06 DIAGNOSIS — R509 Fever, unspecified: Secondary | ICD-10-CM | POA: Diagnosis not present

## 2020-02-06 DIAGNOSIS — R059 Cough, unspecified: Secondary | ICD-10-CM

## 2020-02-06 DIAGNOSIS — J209 Acute bronchitis, unspecified: Secondary | ICD-10-CM | POA: Diagnosis not present

## 2020-02-06 DIAGNOSIS — J45991 Cough variant asthma: Secondary | ICD-10-CM

## 2020-02-06 MED ORDER — MONTELUKAST SODIUM 10 MG PO TABS: 10.0000 mg | ORAL_TABLET | Freq: Every day | ORAL | 0 refills | Status: DC

## 2020-02-06 MED ORDER — PREDNISONE 20 MG PO TABS: 40.0000 mg | ORAL_TABLET | Freq: Every day | ORAL | 0 refills | Status: AC

## 2020-02-06 MED ORDER — BENZONATATE 100 MG PO CAPS: 100.0000 mg | ORAL_CAPSULE | Freq: Three times a day (TID) | ORAL | 0 refills | Status: DC

## 2020-02-06 NOTE — Discharge Instructions (Addendum)

## 2020-02-06 NOTE — ED Provider Notes (Signed)
[facility]    CSN: [id number] Arrival date & time: 02/06/20  [id number]      History   Chief Complaint Chief Complaint  Patient presents with  . Cough    HPI Cindy Rogers is a 69 y.o. female with history of GERD, asthma presenting for for bronchitis.  Endorsing nasal congestion, postnasal drip greater than 1 week ago which has now turned into cough since Friday.  States cough is productive, without hemoptysis.  Has not needed to use her albuterol inhaler at home.  Reports compliance with Singulair, Symbicort.  Does note low-grade fevers (unknown T-max).  No shortness of breath, chest pain, nausea, thoughts, myalgias.  No known sick contacts.  Patient did receive second dose of Pfizer Covid vaccine at the end of March.  Has received pneumonia vaccines in the past.   Past Medical History:  Diagnosis Date  . Asthma   . GERD (gastroesophageal reflux disease)   . Headache(784.0)   . Pneumonia   . PONV (postoperative nausea and vomiting)   . Shortness of breath     Patient Active Problem List   Diagnosis Date Noted  . Encounter for Medicare annual wellness exam 01/01/2019  . Elevated LDL cholesterol level 09/11/2018  . Elevated TSH 08/30/2018  . Fever 09/12/2017  . Acute otitis media 09/12/2017  . Vitamin D deficiency 05/02/2017  . Healthcare maintenance 05/02/2017  . Elevated cholesterol 03/03/2017  . Medicare annual wellness visit, subsequent 03/02/2017  . Chronic idiopathic constipation 01/26/2017  . Bronchitis 01/26/2017  . Osteoporosis of femur without pathological fracture 05/10/2014  . Cough variant asthma 10/13/2013  . Intrinsic asthma 12/10/2011    Past Surgical History:  Procedure Laterality Date  . ABDOMINAL HYSTERECTOMY    . CHOLECYSTECTOMY    . RHINOPLASTY      OB History   No obstetric history on file.      Home Medications    Prior to Admission medications   Medication Sig Start Date End Date Taking? Authorizing Provider  albuterol  (PROAIR HFA) 108 (90 Base) MCG/ACT inhaler INHALE 2 PUFFS EVERY 4 HOURS AS NEEDED ONLY IF YOU CAN'T CATCH YOUR BREATH. 09/28/19   Danford, Valetta Fuller D, NP  atorvastatin (LIPITOR) 20 MG tablet TAKE 1 TABLET BY MOUTH ONCE WEEKLY 12/19/19   Opalski, Neoma Laming, DO  benzonatate (TESSALON) 100 MG capsule Take 1 capsule (100 mg total) by mouth every 8 (eight) hours. 02/06/20   Hall-Potvin, [place], PA-C  calcium-vitamin D (OSCAL WITH D) 500-200 MG-UNIT per tablet Take 1 tablet by mouth daily.    [provider]  cholecalciferol (VITAMIN D) 1000 UNITS tablet Take 2,000 Units by mouth daily.     [provider]  esomeprazole (NEXIUM) 20 MG capsule Take 20 mg by mouth daily at 12 noon.    [provider]  famotidine (PEPCID) 20 MG tablet Take by mouth one at bedtime    [provider]  fluticasone (FLONASE) 50 MCG/ACT nasal spray Place 2 sprays into the nose daily as needed. For allergies    [provider]  ibuprofen (ADVIL,MOTRIN) 200 MG tablet Take 600 mg by mouth every 6 (six) hours as needed. For pain    [provider]  montelukast (SINGULAIR) 10 MG tablet Take 1 tablet (10 mg total) by mouth at bedtime. 02/06/20   Hall-Potvin, [place], PA-C  predniSONE (DELTASONE) 20 MG tablet Take 2 tablets (40 mg total) by mouth daily for 5 days. 02/06/20 02/11/20  Hall-Potvin, [place], PA-C  SYMBICORT 80-4.5 MCG/ACT inhaler  Inhale 2 puffs by mouth twice a day 12/27/19   Lorrene Reid, PA-C    Family History Family History  Problem Relation Age of Onset  . Emphysema Father        smoked  . Heart disease Father   . Kidney cancer Father        with mets to liver and retina  . Heart disease Mother     Social History Social History   Tobacco Use  . Smoking status: Never Smoker  . Smokeless tobacco: Never Used  Substance Use Topics  . Alcohol use: Yes    Comment: glass of wine approx twice per wk  . Drug use: No     Allergies   Food, Shellfish allergy,  Codeine, Penicillins, Sulfa antibiotics, and Neosporin af [miconazole]   Review of Systems As per HPI   Physical Exam Triage Vital Signs ED Triage Vitals  Enc Vitals Group     BP      Pulse      Resp      Temp      Temp src      SpO2      Weight      Height      Head Circumference      Peak Flow      Pain Score      Pain Loc      Pain Edu?      Excl. in [place]?    No data found.  Updated Vital Signs BP (!) 144/84 (BP Location: Left Arm)   Pulse 90   Temp 99.4 F (37.4 C) (Oral)   Resp 18   SpO2 94%   Visual Acuity Right Eye Distance:   Left Eye Distance:   Bilateral Distance:    Right Eye Near:   Left Eye Near:    Bilateral Near:     Physical Exam Constitutional:      General: She is not in acute distress.    Appearance: She is obese. She is not ill-appearing or diaphoretic.  HENT:     Head: Normocephalic and atraumatic.     Mouth/Throat:     Mouth: Mucous membranes are moist.     Pharynx: Oropharynx is clear. No oropharyngeal exudate or posterior oropharyngeal erythema.  Eyes:     General: No scleral icterus.    Conjunctiva/sclera: Conjunctivae normal.     Pupils: Pupils are equal, round, and reactive to light.  Neck:     Comments: Trachea midline, negative JVD Cardiovascular:     Rate and Rhythm: Normal rate and regular rhythm.     Heart sounds: No murmur. No gallop.   Pulmonary:     Effort: Pulmonary effort is normal. No respiratory distress.     Breath sounds: Wheezing and rhonchi present. No rales.     Comments: Diffuse adventitia Musculoskeletal:     Cervical back: Neck supple. No tenderness.  Lymphadenopathy:     Cervical: No cervical adenopathy.  Skin:    Capillary Refill: Capillary refill takes less than 2 seconds.     Coloration: Skin is not jaundiced or pale.     Findings: No rash.  Neurological:     General: No focal deficit present.     Mental Status: She is alert and oriented to person, place, and time.      UC Treatments /  Results  Labs (all labs ordered are listed, but only abnormal results are displayed) Labs Reviewed  NOVEL CORONAVIRUS, NAA    EKG  Radiology DG Chest 2 View  Result Date: 02/06/2020 CLINICAL DATA:  Productive cough and fever. EXAM: CHEST - 2 VIEW COMPARISON:  06/22/2019 and 04/03/2015 FINDINGS: The heart size and pulmonary vascularity are normal and the lungs are clear except for a tiny area scarring at the left lung base laterally, stable. No significant bone abnormality. IMPRESSION: No active cardiopulmonary disease. Electronically Signed   By: Lorriane Shire M.D.   On: 02/06/2020 09:18    Procedures Procedures (including critical care time)  Medications Ordered in UC Medications - No data to display  Initial Impression / Assessment and Plan / UC Course  I have reviewed the triage vital signs and the nursing notes.  Pertinent labs & imaging results that were available during my care of the patient were reviewed by me and considered in my medical decision making (see chart for details).     Patient afebrile, nontoxic, with SpO2 94%.  Covid PCR pending.  Patient to quarantine until results are back.  Chest x-ray done office, reviewed by me and radiology.  Compared to previous from 04/03/2015: Negative for cardiopulmonary disease.  Visualized with patient verbalized understanding.  We will treat supportively as outlined below.  Return precautions discussed, patient verbalized understanding and is agreeable to plan. Final Clinical Impressions(s) / UC Diagnoses   Final diagnoses:  Cough  Acute bronchitis, unspecified organism  Cough variant asthma     Discharge Instructions     Tessalon for cough. Start flonase, atrovent nasal spray for nasal congestion/drainage. You can use over the counter nasal saline rinse such as neti pot for nasal congestion. Keep hydrated, your urine should be clear to pale yellow in color. Tylenol/motrin for fever and pain. Monitor for any worsening of  symptoms, chest pain, shortness of breath, wheezing, swelling of the throat, go to the emergency department for further evaluation needed.     ED Prescriptions    Medication Sig Dispense Auth. Provider   benzonatate (TESSALON) 100 MG capsule Take 1 capsule (100 mg total) by mouth every 8 (eight) hours. 21 capsule Hall-Potvin, [place], PA-C   predniSONE (DELTASONE) 20 MG tablet Take 2 tablets (40 mg total) by mouth daily for 5 days. 10 tablet Hall-Potvin, [place], PA-C   montelukast (SINGULAIR) 10 MG tablet Take 1 tablet (10 mg total) by mouth at bedtime. 30 tablet Hall-Potvin, [place], PA-C     PDMP not reviewed this encounter.   Hall-Potvin, [place], [place] 02/06/20 1353

## 2020-02-06 NOTE — ED Triage Notes (Signed)
Pt c/o productive cough with green sputum since Friday and having a low grade fever. States had nasal congestion a wk ago.

## 2020-02-07 LAB — SARS-COV-2, NAA 2 DAY TAT

## 2020-02-07 LAB — NOVEL CORONAVIRUS, NAA: SARS-CoV-2, NAA: NOT DETECTED

## 2020-03-03 ENCOUNTER — Encounter: Payer: Self-pay | Admitting: Physician Assistant

## 2020-03-03 ENCOUNTER — Other Ambulatory Visit: Payer: Self-pay

## 2020-03-03 ENCOUNTER — Ambulatory Visit (INDEPENDENT_AMBULATORY_CARE_PROVIDER_SITE_OTHER): Payer: PPO | Admitting: Physician Assistant

## 2020-03-03 VITALS — BP 110/75 | HR 81 | Temp 98.7°F | Ht 70.5 in | Wt 219.6 lb

## 2020-03-03 DIAGNOSIS — J45991 Cough variant asthma: Secondary | ICD-10-CM

## 2020-03-03 DIAGNOSIS — J40 Bronchitis, not specified as acute or chronic: Secondary | ICD-10-CM

## 2020-03-03 DIAGNOSIS — J45909 Unspecified asthma, uncomplicated: Secondary | ICD-10-CM | POA: Diagnosis not present

## 2020-03-03 DIAGNOSIS — E785 Hyperlipidemia, unspecified: Secondary | ICD-10-CM

## 2020-03-03 MED ORDER — BUDESONIDE-FORMOTEROL FUMARATE 80-4.5 MCG/ACT IN AERO: 2.0000 | INHALATION_SPRAY | Freq: Two times a day (BID) | RESPIRATORY_TRACT | 3 refills | Status: DC

## 2020-03-03 MED ORDER — MONTELUKAST SODIUM 10 MG PO TABS: 10.0000 mg | ORAL_TABLET | Freq: Every day | ORAL | 1 refills | Status: DC

## 2020-03-03 NOTE — Assessment & Plan Note (Signed)
-   Stable - Continue Symbicort and Singulair - Use albuterol prn

## 2020-03-03 NOTE — Patient Instructions (Signed)

## 2020-03-03 NOTE — Assessment & Plan Note (Signed)
-   Last lipid panel wnl's with mildly elevated LDL at 104 - Discussed with patient alternative medication options due to side effect of arthralgia from Lipitor. Pending lipid panel results today, patient is agreeable to switching to Crestor 5 mg once weekly. If lipid panel wnl's, discussed to continue heart healthy diet and physical activity level. Otherwise, will send rx for Crestor 5 mg once weekly.  - Will continue to monitor.

## 2020-03-03 NOTE — Assessment & Plan Note (Addendum)
-   Stable - Continue Symbicort. Provided refills. - Use albuterol prn.

## 2020-03-03 NOTE — Progress Notes (Signed)
Established Patient Office Visit  Subjective:  Patient ID: Cindy Rogers, female    DOB: [birth year]  Age: 69 y.o. MRN: [id number]  CC:  Chief Complaint  Patient presents with   Hyperlipidemia    HPI Cindy Rogers presents for follow-up on hyperlipidemia.  HLD: Pt reports she has been out of Atorvastatin and hasn't taken it in past 6 weeks, and has noticed that her joint pain has improved. She does try to follow a heart healthy diet by limiting red meads and fried foods. Her diet consists of chicken, salads and vegetables. She has also increased her physical activity level and does yoga. She is using MyFitnessPal to help track her calories. She would like to lose about 20 pounds.  Asthma, bronchitis: Pt requesting refills of Symbicort and Singulair. Reports she went to [facility] 01/2020 for bronchitis and was given tessoalon capsules and prednisone, which helped. She currently has chest congestion and her sputum is clear. She started Mucinex a few days ago and feels like it is helping. She had wheezing yesterday which is better today. Reports low-grade fevers of 99. Denies chills. She has been around her grandchild which started to have a runny nose so feels like she might have picked up something.    Past Medical History:  Diagnosis Date   Asthma    GERD (gastroesophageal reflux disease)    Headache(784.0)    Pneumonia    PONV (postoperative nausea and vomiting)    Shortness of breath     Past Surgical History:  Procedure Laterality Date   ABDOMINAL HYSTERECTOMY     CHOLECYSTECTOMY     RHINOPLASTY      Family History  Problem Relation Age of Onset   Emphysema Father        smoked   Heart disease Father    Kidney cancer Father        with mets to liver and retina   Heart disease Mother     Social History   Socioeconomic History   Marital status: Married    Spouse name: Not on file   Number of children: Not on file   Years of education: Not on file    Highest education level: Not on file  Occupational History   Occupation: Retired Associate Professor: [employer]  Tobacco Use   Smoking status: Never Smoker   Smokeless tobacco: Never Used  Substance and Sexual Activity   Alcohol use: Yes    Comment: glass of wine approx twice per wk   Drug use: No   Sexual activity: Yes    Birth control/protection: Post-menopausal  Other Topics Concern   Not on file  Social History Narrative   Not on file   Social Determinants of Health   Financial Resource Strain:    Difficulty of Paying Living Expenses:   Food Insecurity:    Worried About Charity fundraiser in the Last Year:    Arboriculturist in the Last Year:   Transportation Needs:    Film/video editor (Medical):    Lack of Transportation (Non-Medical):   Physical Activity:    Days of Exercise per Week:    Minutes of Exercise per Session:   Stress:    Feeling of Stress :   Social Connections:    Frequency of Communication with Friends and Family:    Frequency of Social Gatherings with Friends and Family:    Attends Religious Services:    Active Member of  Clubs or Organizations:    Attends Music therapist:    Marital Status:   Intimate Partner Violence:    Fear of Current or Ex-Partner:    Emotionally Abused:    Physically Abused:    Sexually Abused:     Outpatient Medications Prior to Visit  Medication Sig Dispense Refill   albuterol (PROAIR HFA) 108 (90 Base) MCG/ACT inhaler INHALE 2 PUFFS EVERY 4 HOURS AS NEEDED ONLY IF YOU CAN'T [place]. 8.5 g 1   calcium-vitamin D (OSCAL WITH D) 500-200 MG-UNIT per tablet Take 1 tablet by mouth daily.     cholecalciferol (VITAMIN D) 1000 UNITS tablet Take 2,000 Units by mouth daily.      esomeprazole (NEXIUM) 20 MG capsule Take 20 mg by mouth daily at 12 noon.     famotidine (PEPCID) 20 MG tablet Take by mouth one at bedtime     fluticasone (FLONASE) 50 MCG/ACT nasal  spray Place 2 sprays into the nose daily as needed. For allergies     guaiFENesin (MUCINEX) 600 MG 12 hr tablet Take by mouth 2 (two) times daily as needed.     ibuprofen (ADVIL,MOTRIN) 200 MG tablet Take 600 mg by mouth every 6 (six) hours as needed. For pain     polyethylene glycol (MIRALAX / GLYCOLAX) 17 g packet Take 17 g by mouth daily. 2-3 times weekly     montelukast (SINGULAIR) 10 MG tablet Take 1 tablet (10 mg total) by mouth at bedtime. 30 tablet 0   SYMBICORT 80-4.5 MCG/ACT inhaler Inhale 2 puffs by mouth twice a day 6.9 g 0   atorvastatin (LIPITOR) 20 MG tablet TAKE 1 TABLET BY MOUTH ONCE WEEKLY (Patient not taking: Reported on 03/03/2020) 4 tablet 0   benzonatate (TESSALON) 100 MG capsule Take 1 capsule (100 mg total) by mouth every 8 (eight) hours. 21 capsule 0   No facility-administered medications prior to visit.    Allergies  Allergen Reactions   Food Anaphylaxis    "tree nuts"   Shellfish Allergy Shortness Of Breath   Codeine     "wild dreams"   Penicillins     "ashmatic" 09-30-2018 pt has taken in the past year and had no problems was told not to take since asthmatic.    Sulfa Antibiotics Nausea And Vomiting   Neosporin Af [Miconazole]     Rash    ROS Review of Systems  A fourteen system review of systems was performed and found to be positive as per HPI.  Objective:    Physical Exam General: Well nourished, in no apparent distress. Eyes: PERRLA, EOMs, conjunctiva clr Resp: Respiratory effort- normal, ECTA B/L with minimal expiratory wheezing, no crackles or rales. Cardio: RRR with no murmur. Abdomen: no gross distention. M-sk: Full ROM, good strength, normal gait.  Skin: Warm, dry  Vascular: No edema present. Neuro: Alert, Oriented Psych: Normal affect, Insight and Judgment appropriate.   BP 110/75    Pulse 81    Temp 98.7 F (37.1 C) (Oral)    Ht 5' 10.5" (1.791 m)    Wt 219 lb 9.6 oz (99.6 kg)    SpO2 97% Comment: on RA   BMI 31.06 kg/m   Wt Readings from Last 3 Encounters:  03/03/20 219 lb 9.6 oz (99.6 kg)  01/01/19 225 lb (102.1 kg)  09/11/18 215 lb 4.8 oz (97.7 kg)     Health Maintenance Due  Topic Date Due   URINE MICROALBUMIN  Never done    There  are no preventive care reminders to display for this patient.  Lab Results  Component Value Date   TSH 3.050 09/27/2019   Lab Results  Component Value Date   WBC 4.6 09/27/2019   HGB 13.9 09/27/2019   HCT 41.9 09/27/2019   MCV 91 09/27/2019   PLT 179 09/27/2019   Lab Results  Component Value Date   NA 142 09/27/2019   K 4.5 09/27/2019   CO2 24 09/27/2019   GLUCOSE 85 09/27/2019   BUN 20 09/27/2019   CREATININE 0.77 09/27/2019   BILITOT 0.5 09/27/2019   ALKPHOS 62 09/27/2019   AST 14 09/27/2019   ALT 12 09/27/2019   PROT 6.6 09/27/2019   ALBUMIN 4.5 09/27/2019   CALCIUM 10.4 (H) 09/27/2019   Lab Results  Component Value Date   CHOL 174 09/27/2019   Lab Results  Component Value Date   HDL 57 09/27/2019   Lab Results  Component Value Date   LDLCALC 104 (H) 09/27/2019   Lab Results  Component Value Date   TRIG 66 09/27/2019   Lab Results  Component Value Date   CHOLHDL 3.1 09/27/2019   Lab Results  Component Value Date   HGBA1C 5.2 09/27/2019      Assessment & Plan:   Problem List Items Addressed This Visit      Respiratory   Intrinsic asthma    - Stable - Continue Symbicort. Provided refills. - Use albuterol prn.      Relevant Medications   budesonide-formoterol (SYMBICORT) 80-4.5 MCG/ACT inhaler   montelukast (SINGULAIR) 10 MG tablet   Cough variant asthma    - Stable - Continue Symbicort and Singulair - Use albuterol prn      Relevant Medications   budesonide-formoterol (SYMBICORT) 80-4.5 MCG/ACT inhaler   montelukast (SINGULAIR) 10 MG tablet   Bronchitis    - Symptoms are improving. - Continue mucinex. - Use humidifier to help loosen chest congestion and flonase spray for postnasal drainage.  - If symptoms  worsen notify the office and will consider starting short course of prednisone.          Other   Hyperlipidemia - Primary    - Last lipid panel wnl's with mildly elevated LDL at 104 - Discussed with patient alternative medication options due to side effect of arthralgia from Lipitor. Pending lipid panel results today, patient is agreeable to switching to Crestor 5 mg once weekly. If lipid panel wnl's, discussed to continue heart healthy diet and physical activity level. Otherwise, will send rx for Crestor 5 mg once weekly.  - Will continue to monitor.       Relevant Orders   Lipid Profile      Meds ordered this encounter  Medications   budesonide-formoterol (SYMBICORT) 80-4.5 MCG/ACT inhaler    Sig: Inhale 2 puffs into the lungs 2 (two) times daily.    Dispense:  6.9 g    Refill:  3    Order Specific Question:   Supervising Provider    Answer:   Beatrice Lecher D [[id number]]    Order Specific Question:   Lot Number?    Answer:   [id number]    Order Specific Question:   Expiration Date?    Answer:   07/14/2017    Comments:   a    Order Specific Question:   Manufacturer?    Answer:   [employer] [[id number]]    Order Specific Question:   Quantity    Answer:   1   montelukast (SINGULAIR) 10 MG  tablet    Sig: Take 1 tablet (10 mg total) by mouth at bedtime.    Dispense:  90 tablet    Refill:  1    Order Specific Question:   Supervising Provider    Answer:   Beatrice Lecher D [[id number]]    Follow-up: Return in about 4 months (around 07/03/2020) for MCW and FBW (lipid, cmp) .    Lorrene Reid, PA-C

## 2020-03-03 NOTE — Assessment & Plan Note (Addendum)
-   Symptoms are improving. - Continue mucinex. - Use humidifier to help loosen chest congestion and flonase spray for postnasal drainage.  - If symptoms worsen notify the office and will consider starting short course of prednisone.

## 2020-03-04 ENCOUNTER — Telehealth: Payer: Self-pay | Admitting: Physician Assistant

## 2020-03-04 LAB — LIPID PANEL
Chol/HDL Ratio: 3.8 ratio (ref 0.0–4.4)
Cholesterol, Total: 204 mg/dL — ABNORMAL HIGH (ref 100–199)
HDL: 54 mg/dL (ref >39)
LDL Chol Calc (NIH): 132 mg/dL — ABNORMAL HIGH (ref 0–99)
Triglycerides: 99 mg/dL (ref 0–149)
VLDL Cholesterol Cal: 18 mg/dL (ref 5–40)

## 2020-03-04 NOTE — Telephone Encounter (Signed)
Requested to change Symbicort from name brand to generic. I advised this was fine.AS, CMA

## 2020-03-04 NOTE — Telephone Encounter (Signed)
Melissa at [facility] is requesting a call back for order clarification on patient's inhaler prescription. She can be reached at [phone] with a Ref # [id number]

## 2020-03-06 ENCOUNTER — Telehealth: Payer: Self-pay | Admitting: Physician Assistant

## 2020-03-06 MED ORDER — ROSUVASTATIN CALCIUM 5 MG PO TABS: 5.0000 mg | ORAL_TABLET | Freq: Every day | ORAL | 1 refills | Status: DC

## 2020-03-06 NOTE — Telephone Encounter (Signed)
Left message to notify medication sent to [facility]. AS, CMA

## 2020-03-09 ENCOUNTER — Encounter: Payer: Self-pay | Admitting: Physician Assistant

## 2020-03-10 ENCOUNTER — Other Ambulatory Visit: Payer: Self-pay | Admitting: Physician Assistant

## 2020-03-10 MED ORDER — ROSUVASTATIN CALCIUM 5 MG PO TABS
5.0000 mg | ORAL_TABLET | ORAL | 1 refills | Status: DC
Start: 2020-03-10 — End: 2022-02-15

## 2020-03-10 NOTE — Telephone Encounter (Signed)
Dear Ms. Zenia Resides,  I hope you are doing well. Lipid panel results show total cholesterol and LDL are elevated and have increased from 5 months ago, so will send in Crestor 5 mg to take once weekly and will recheck lipid panel at next OV as discussed. Have a great day!  Please free to contact the office if you have any further questions or concerns.  Warm regards, Lorrene Reid, PA-C

## 2020-05-28 ENCOUNTER — Encounter: Payer: Self-pay | Admitting: Physician Assistant

## 2020-05-28 DIAGNOSIS — J45909 Unspecified asthma, uncomplicated: Secondary | ICD-10-CM

## 2020-05-28 DIAGNOSIS — J45991 Cough variant asthma: Secondary | ICD-10-CM

## 2020-05-28 MED ORDER — BUDESONIDE-FORMOTEROL FUMARATE 80-4.5 MCG/ACT IN AERO: 2.0000 | INHALATION_SPRAY | Freq: Two times a day (BID) | RESPIRATORY_TRACT | 3 refills | Status: DC

## 2020-06-23 DIAGNOSIS — Z1231 Encounter for screening mammogram for malignant neoplasm of breast: Secondary | ICD-10-CM | POA: Diagnosis not present

## 2020-06-23 LAB — HM MAMMOGRAPHY

## 2020-07-04 ENCOUNTER — Encounter: Payer: Self-pay | Admitting: Physician Assistant

## 2020-07-04 DIAGNOSIS — N6002 Solitary cyst of left breast: Secondary | ICD-10-CM | POA: Diagnosis not present

## 2020-07-04 DIAGNOSIS — R922 Inconclusive mammogram: Secondary | ICD-10-CM | POA: Diagnosis not present

## 2020-07-04 DIAGNOSIS — R928 Other abnormal and inconclusive findings on diagnostic imaging of breast: Secondary | ICD-10-CM | POA: Diagnosis not present

## 2020-07-11 ENCOUNTER — Encounter: Payer: Self-pay | Admitting: Physician Assistant

## 2020-07-16 ENCOUNTER — Other Ambulatory Visit: Payer: Self-pay

## 2020-07-16 ENCOUNTER — Ambulatory Visit (INDEPENDENT_AMBULATORY_CARE_PROVIDER_SITE_OTHER): Payer: PPO | Admitting: Physician Assistant

## 2020-07-16 VITALS — BP 117/62 | HR 71 | Ht 70.47 in | Wt 225.4 lb

## 2020-07-16 DIAGNOSIS — E785 Hyperlipidemia, unspecified: Secondary | ICD-10-CM | POA: Diagnosis not present

## 2020-07-16 DIAGNOSIS — Z0000 Encounter for general adult medical examination without abnormal findings: Secondary | ICD-10-CM | POA: Diagnosis not present

## 2020-07-16 DIAGNOSIS — E7800 Pure hypercholesterolemia, unspecified: Secondary | ICD-10-CM | POA: Diagnosis not present

## 2020-07-16 DIAGNOSIS — R7989 Other specified abnormal findings of blood chemistry: Secondary | ICD-10-CM | POA: Diagnosis not present

## 2020-07-16 DIAGNOSIS — E559 Vitamin D deficiency, unspecified: Secondary | ICD-10-CM

## 2020-07-16 DIAGNOSIS — Z1211 Encounter for screening for malignant neoplasm of colon: Secondary | ICD-10-CM

## 2020-07-16 NOTE — Patient Instructions (Addendum)
Heart-Healthy Eating Plan Heart-healthy meal planning includes:  Eating less unhealthy fats.  Eating more healthy fats.  Making other changes in your diet. Talk with your doctor or a diet specialist (dietitian) to create an eating plan that is right for you. What is my plan? Your doctor may recommend an eating plan that includes:  Total fat: ______% or less of total calories a day.  Saturated fat: ______% or less of total calories a day.  Cholesterol: less than _________mg a day. What are tips for following this plan? Cooking Avoid frying your food. Try to bake, boil, grill, or broil it instead. You can also reduce fat by:  Removing the skin from poultry.  Removing all visible fats from meats.  Steaming vegetables in water or broth. Meal planning   At meals, divide your plate into four equal parts: ? Fill one-half of your plate with vegetables and green salads. ? Fill one-fourth of your plate with whole grains. ? Fill one-fourth of your plate with lean protein foods.  Eat 4-5 servings of vegetables per day. A serving of vegetables is: ? 1 cup of raw or cooked vegetables. ? 2 cups of raw leafy greens.  Eat 4-5 servings of fruit per day. A serving of fruit is: ? 1 medium whole fruit. ?  cup of dried fruit. ?  cup of fresh, frozen, or canned fruit. ?  cup of 100% fruit juice.  Eat more foods that have soluble fiber. These are apples, broccoli, carrots, beans, peas, and barley. Try to get 20-30 g of fiber per day.  Eat 4-5 servings of nuts, legumes, and seeds per week: ? 1 serving of dried beans or legumes equals  cup after being cooked. ? 1 serving of nuts is  cup. ? 1 serving of seeds equals 1 tablespoon. General information  Eat more home-cooked food. Eat less restaurant, buffet, and fast food.  Limit or avoid alcohol.  Limit foods that are high in starch and sugar.  Avoid fried foods.  Lose weight if you are overweight.  Keep track of how much salt  (sodium) you eat. This is important if you have high blood pressure. Ask your doctor to tell you more about this.  Try to add vegetarian meals each week. Fats  Choose healthy fats. These include olive oil and canola oil, flaxseeds, walnuts, almonds, and seeds.  Eat more omega-3 fats. These include salmon, mackerel, sardines, tuna, flaxseed oil, and ground flaxseeds. Try to eat fish at least 2 times each week.  Check food labels. Avoid foods with trans fats or high amounts of saturated fat.  Limit saturated fats. ? These are often found in animal products, such as meats, butter, and cream. ? These are also found in plant foods, such as palm oil, palm kernel oil, and coconut oil.  Avoid foods with partially hydrogenated oils in them. These have trans fats. Examples are stick margarine, some tub margarines, cookies, crackers, and other baked goods. What foods can I eat? Fruits All fresh, canned (in natural juice), or frozen fruits. Vegetables Fresh or frozen vegetables (raw, steamed, roasted, or grilled). Green salads. Grains Most grains. Choose whole wheat and whole grains most of the time. Rice and pasta, including brown rice and pastas made with whole wheat. Meats and other proteins Lean, well-trimmed beef, veal, pork, and lamb. Chicken and [place] without skin. All fish and shellfish. Wild duck, rabbit, pheasant, and venison. Egg whites or low-cholesterol egg substitutes. Dried beans, peas, lentils, and tofu. Seeds and most  nuts. Dairy Low-fat or nonfat cheeses, including ricotta and mozzarella. Skim or 1% milk that is liquid, powdered, or evaporated. Buttermilk that is made with low-fat milk. Nonfat or low-fat yogurt. Fats and oils Non-hydrogenated (trans-free) margarines. Vegetable oils, including soybean, sesame, sunflower, olive, peanut, safflower, corn, canola, and cottonseed. Salad dressings or mayonnaise made with a vegetable oil. Beverages Mineral water. Coffee and tea. Diet  carbonated beverages. Sweets and desserts Sherbet, gelatin, and fruit ice. Small amounts of dark chocolate. Limit all sweets and desserts. Seasonings and condiments All seasonings and condiments. The items listed above may not be a complete list of foods and drinks you can eat. Contact a dietitian for more options. What foods should I avoid? Fruits Canned fruit in heavy syrup. Fruit in cream or butter sauce. Fried fruit. Limit coconut. Vegetables Vegetables cooked in cheese, cream, or butter sauce. Fried vegetables. Grains Breads that are made with saturated or trans fats, oils, or whole milk. Croissants. Sweet rolls. Donuts. High-fat crackers, such as cheese crackers. Meats and other proteins Fatty meats, such as hot dogs, ribs, sausage, bacon, rib-eye roast or steak. High-fat deli meats, such as salami and bologna. Caviar. Domestic duck and goose. Organ meats, such as liver. Dairy Cream, sour cream, cream cheese, and creamed cottage cheese. Whole-milk cheeses. Whole or 2% milk that is liquid, evaporated, or condensed. Whole buttermilk. Cream sauce or high-fat cheese sauce. Yogurt that is made from whole milk. Fats and oils Meat fat, or shortening. Cocoa butter, hydrogenated oils, palm oil, coconut oil, palm kernel oil. Solid fats and shortenings, including bacon fat, salt pork, lard, and butter. Nondairy cream substitutes. Salad dressings with cheese or sour cream. Beverages Regular sodas and juice drinks with added sugar. Sweets and desserts Frosting. Pudding. Cookies. Cakes. Pies. Milk chocolate or white chocolate. Buttered syrups. Full-fat ice cream or ice cream drinks. The items listed above may not be a complete list of foods and drinks to avoid. Contact a dietitian for more information. Summary  Heart-healthy meal planning includes eating less unhealthy fats, eating more healthy fats, and making other changes in your diet.  Eat a balanced diet. This includes fruits and  vegetables, low-fat or nonfat dairy, lean protein, nuts and legumes, whole grains, and heart-healthy oils and fats. This information is not intended to replace advice given to you by your health care provider. Make sure you discuss any questions you have with your health care provider. Document Revised: 11/03/2017 Document Reviewed: 10/07/2017 Elsevier Patient Education  2020 [place] 65 Years and Older, Female Preventive care refers to lifestyle choices and visits with your health care provider that can promote health and wellness. This includes:  A yearly physical exam. This is also called an annual well check.  Regular dental and eye exams.  Immunizations.  Screening for certain conditions.  Healthy lifestyle choices, such as diet and exercise. What can I expect for my preventive care visit? Physical exam Your health care provider will check:  Height and weight. These may be used to calculate body mass index (BMI), which is a measurement that tells if you are at a healthy weight.  Heart rate and blood pressure.  Your skin for abnormal spots. Counseling Your health care provider may ask you questions about:  Alcohol, tobacco, and drug use.  Emotional well-being.  Home and relationship well-being.  Sexual activity.  Eating habits.  History of falls.  Memory and ability to understand (cognition).  Work and work Statistician.  Pregnancy and menstrual  history. What immunizations do I need?  Influenza (flu) vaccine  This is recommended every year. Tetanus, diphtheria, and pertussis (Tdap) vaccine  You may need a Td booster every 10 years. Varicella (chickenpox) vaccine  You may need this vaccine if you have not already been vaccinated. Zoster (shingles) vaccine  You may need this after age 92. Pneumococcal conjugate (PCV13) vaccine  One dose is recommended after age 62. Pneumococcal polysaccharide (PPSV23) vaccine  One dose is  recommended after age 41. Measles, mumps, and rubella (MMR) vaccine  You may need at least one dose of MMR if you were born in 1957 or later. You may also need a second dose. Meningococcal conjugate (MenACWY) vaccine  You may need this if you have certain conditions. Hepatitis A vaccine  You may need this if you have certain conditions or if you travel or work in places where you may be exposed to hepatitis A. Hepatitis B vaccine  You may need this if you have certain conditions or if you travel or work in places where you may be exposed to hepatitis B. Haemophilus influenzae type b (Hib) vaccine  You may need this if you have certain conditions. You may receive vaccines as individual doses or as more than one vaccine together in one shot (combination vaccines). Talk with your health care provider about the risks and benefits of combination vaccines. What tests do I need? Blood tests  Lipid and cholesterol levels. These may be checked every 5 years, or more frequently depending on your overall health.  Hepatitis C test.  Hepatitis B test. Screening  Lung cancer screening. You may have this screening every year starting at age 21 if you have a 30-pack-year history of smoking and currently smoke or have quit within the past 15 years.  Colorectal cancer screening. All adults should have this screening starting at age 38 and continuing until age 63. Your health care provider may recommend screening at age 16 if you are at increased risk. You will have tests every 1-10 years, depending on your results and the type of screening test.  Diabetes screening. This is done by checking your blood sugar (glucose) after you have not eaten for a while (fasting). You may have this done every 1-3 years.  Mammogram. This may be done every 1-2 years. Talk with your health care provider about how often you should have regular mammograms.  BRCA-related cancer screening. This may be done if you have a  family history of breast, ovarian, tubal, or peritoneal cancers. Other tests  Sexually transmitted disease (STD) testing.  Bone density scan. This is done to screen for osteoporosis. You may have this done starting at age 5. Follow these instructions at home: Eating and drinking  Eat a diet that includes fresh fruits and vegetables, whole grains, lean protein, and low-fat dairy products. Limit your intake of foods with high amounts of sugar, saturated fats, and salt.  Take vitamin and mineral supplements as recommended by your health care provider.  Do not drink alcohol if your health care provider tells you not to drink.  If you drink alcohol: ? Limit how much you have to 0-1 drink a day. ? Be aware of how much alcohol is in your drink. In the [place] one drink equals one 12 oz bottle of beer (355 mL), one 5 oz glass of wine (148 mL), or one 1 oz glass of hard liquor (44 mL). Lifestyle  Take daily care of your teeth and gums.  Stay active.  Exercise for at least 30 minutes on 5 or more days each week.  Do not use any products that contain nicotine or tobacco, such as cigarettes, e-cigarettes, and chewing tobacco. If you need help quitting, ask your health care provider.  If you are sexually active, practice safe sex. Use a condom or other form of protection in order to prevent STIs (sexually transmitted infections).  Talk with your health care provider about taking a low-dose aspirin or statin. What's next?  Go to your health care provider once a year for a well check visit.  Ask your health care provider how often you should have your eyes and teeth checked.  Stay up to date on all vaccines. This information is not intended to replace advice given to you by your health care provider. Make sure you discuss any questions you have with your health care provider. Document Revised: 08/24/2018 Document Reviewed: 08/24/2018 Elsevier Patient Education  [place].   Constipation, Adult Constipation is when a person has fewer bowel movements in a week than normal, has difficulty having a bowel movement, or has stools that are dry, hard, or larger than normal. Constipation may be caused by an underlying condition. It may become worse with age if a person takes certain medicines and does not take in enough fluids. Follow these instructions at home: Eating and drinking   Eat foods that have a lot of fiber, such as fresh fruits and vegetables, whole grains, and beans.  Limit foods that are high in fat, low in fiber, or overly processed, such as french fries, hamburgers, cookies, candies, and soda.  Drink enough fluid to keep your urine clear or pale yellow. General instructions  Exercise regularly or as told by your health care provider.  Go to the restroom when you have the urge to go. Do not hold it in.  Take over-the-counter and prescription medicines only as told by your health care provider. These include any fiber supplements.  Practice pelvic floor retraining exercises, such as deep breathing while relaxing the lower abdomen and pelvic floor relaxation during bowel movements.  Watch your condition for any changes.  Keep all follow-up visits as told by your health care provider. This is important. Contact a health care provider if:  You have pain that gets worse.  You have a fever.  You do not have a bowel movement after 4 days.  You vomit.  You are not hungry.  You lose weight.  You are bleeding from the anus.  You have thin, pencil-like stools. Get help right away if:  You have a fever and your symptoms suddenly get worse.  You leak stool or have blood in your stool.  Your abdomen is bloated.  You have severe pain in your abdomen.  You feel dizzy or you faint. This information is not intended to replace advice given to you by your health care provider. Make sure you discuss any questions you have with your health  care provider. Document Revised: 08/12/2017 Document Reviewed: 02/18/2016 Elsevier Patient Education  2020 [employer].

## 2020-07-16 NOTE — Progress Notes (Signed)
Subjective:   Cindy Rogers is a 69 y.o. female who presents for Medicare Annual (Subsequent) preventive examination.  Review of Systems    General:   No F/C, wt loss Pulm:   No DIB, SOB, pleuritic chest pain Card:  No CP, palpitations Abd:  No n/v/d or pain Ext:  No inc edema from baseline     Objective:    There were no vitals filed for this visit. There is no height or weight on file to calculate BMI.  Advanced Directives 09/30/2018 01/26/2017 12/10/2011  Does Patient Have a Medical Advance Directive? No Yes Patient has advance directive, copy not in chart  Type of Advance Directive - - Living will  Would patient like information on creating a medical advance directive? No - Patient declined - -    Current Medications (verified) Outpatient Encounter Medications as of 07/16/2020  Medication Sig  . albuterol (PROAIR HFA) 108 (90 Base) MCG/ACT inhaler INHALE 2 PUFFS EVERY 4 HOURS AS NEEDED ONLY IF YOU CAN'T CATCH YOUR BREATH.  . budesonide-formoterol (SYMBICORT) 80-4.5 MCG/ACT inhaler Inhale 2 puffs into the lungs 2 (two) times daily.  . calcium-vitamin D (OSCAL WITH D) 500-200 MG-UNIT per tablet Take 1 tablet by mouth daily.  . cholecalciferol (VITAMIN D) 1000 UNITS tablet Take 2,000 Units by mouth daily.   Marland Kitchen esomeprazole (NEXIUM) 20 MG capsule Take 20 mg by mouth daily at 12 noon.  . famotidine (PEPCID) 20 MG tablet Take by mouth one at bedtime  . fluticasone (FLONASE) 50 MCG/ACT nasal spray Place 2 sprays into the nose daily as needed. For allergies  . montelukast (SINGULAIR) 10 MG tablet Take 1 tablet (10 mg total) by mouth at bedtime.  . naproxen (NAPROSYN) 500 MG tablet Take 500 mg by mouth 2 (two) times daily with a meal.  . Probiotic Product (CULTURELLE PROBIOTICS PO) Take by mouth.  . rosuvastatin (CRESTOR) 5 MG tablet Take 1 tablet (5 mg total) by mouth once a week.  Marland Kitchen guaiFENesin (MUCINEX) 600 MG 12 hr tablet Take by mouth 2 (two) times daily as needed. (Patient not  taking: Reported on 07/16/2020)  . ibuprofen (ADVIL,MOTRIN) 200 MG tablet Take 600 mg by mouth every 6 (six) hours as needed. For pain (Patient not taking: Reported on 07/16/2020)  . polyethylene glycol (MIRALAX / GLYCOLAX) 17 g packet Take 17 g by mouth daily. 2-3 times weekly (Patient not taking: Reported on 07/16/2020)   No facility-administered encounter medications on file as of 07/16/2020.    Allergies (verified) Food, Shellfish allergy, Codeine, Penicillins, Sulfa antibiotics, and Neosporin af [miconazole]   History: Past Medical History:  Diagnosis Date  . Asthma   . Asthma    Phreesia 07/15/2020  . GERD (gastroesophageal reflux disease)   . Headache(784.0)   . Pneumonia   . PONV (postoperative nausea and vomiting)   . Shortness of breath    Past Surgical History:  Procedure Laterality Date  . ABDOMINAL HYSTERECTOMY    . CHOLECYSTECTOMY    . RHINOPLASTY     Family History  Problem Relation Age of Onset  . Emphysema Father        smoked  . Heart disease Father   . Kidney cancer Father        with mets to liver and retina  . Heart disease Mother    Social History   Socioeconomic History  . Marital status: Married    Spouse name: Not on file  . Number of children: Not on file  .  Years of education: Not on file  . Highest education level: Not on file  Occupational History  . Occupation: Retired Associate Professor: [employer]  Tobacco Use  . Smoking status: Never Smoker  . Smokeless tobacco: Never Used  Substance and Sexual Activity  . Alcohol use: Yes    Comment: glass of wine approx twice per wk  . Drug use: No  . Sexual activity: Yes    Birth control/protection: Post-menopausal  Other Topics Concern  . Not on file  Social History Narrative  . Not on file   Social Determinants of Health   Financial Resource Strain:   . Difficulty of Paying Living Expenses: Not on file  Food Insecurity:   . Worried About Charity fundraiser in the Last Year:  Not on file  . Ran Out of Food in the Last Year: Not on file  Transportation Needs:   . Lack of Transportation (Medical): Not on file  . Lack of Transportation (Non-Medical): Not on file  Physical Activity:   . Days of Exercise per Week: Not on file  . Minutes of Exercise per Session: Not on file  Stress:   . Feeling of Stress : Not on file  Social Connections:   . Frequency of Communication with Friends and Family: Not on file  . Frequency of Social Gatherings with Friends and Family: Not on file  . Attends Religious Services: Not on file  . Active Member of Clubs or Organizations: Not on file  . Attends Archivist Meetings: Not on file  . Marital Status: Not on file    Tobacco Counseling Counseling given: Not Answered    Diabetic? No         Activities of Daily Living In your present state of health, do you have any difficulty performing the following activities: 07/16/2020  Hearing? N  Vision? Y  Difficulty concentrating or making decisions? N  Walking or climbing stairs? N  Dressing or bathing? N  Doing errands, shopping? N  Some recent data might be hidden    Patient Care Team: Lorrene Reid, PA-C as PCP - General (Physician Assistant) Tanda Rockers, MD as Consulting Physician (Pulmonary Disease) Berle Mull, MD as Consulting Physician (Sports Medicine) Sydnee Levans, MD as Consulting Physician (Dermatology) Darleen Crocker, MD as Consulting Physician (Ophthalmology)  Indicate any recent Medical Services you may have received from other than [facility] providers in the past year (date may be approximate).     Assessment:   This is a routine wellness examination for Cindy Rogers.  Hearing/Vision screen No exam data present  Dietary issues and exercise activities discussed: -Follow a heart healthy diet and increase dietary fiber. Continue to stay well hydrated and active.  Goals   None    Depression Screen PHQ 2/9 Scores 07/16/2020 03/03/2020  01/01/2019 09/11/2018 08/14/2018 09/12/2017 09/01/2017  PHQ - 2 Score 0 0 1 0 1 1 0  PHQ- 9 Score 0 2 2 4 4 5  0    Fall Risk Fall Risk  07/16/2020 01/01/2019 09/11/2018 09/01/2017 03/02/2017  Falls in the past year? 0 1 1 No No  Number falls in past yr: - 0 1 - -  Injury with Fall? - 1 1 - -  Follow up Falls evaluation completed - - - -    Any stairs in or around the home? Yes  If so, are there any without handrails? Yes  Home free of loose throw rugs in walkways, pet beds, electrical cords,  etc? No  Rugs in bathroom and kitchen door Adequate lighting in your home to reduce risk of falls? Yes   ASSISTIVE DEVICES UTILIZED TO PREVENT FALLS:  Life alert? No  Use of a cane, walker or w/c? No  Grab bars in the bathroom? Yes  Shower chair or bench in shower? No  Elevated toilet seat or a handicapped toilet? Yes   TIMED UP AND GO:  Was the test performed? Yes .  Length of time to ambulate 10 feet: 9 sec.   Gait steady and fast without use of assistive device  Cognitive Function: wnl     6CIT Screen 07/16/2020 01/01/2019 03/02/2017  What Year? 0 points 0 points 0 points  What month? 0 points 0 points 0 points  What time? 0 points 0 points 0 points  Count back from 20 0 points 0 points 0 points  Months in reverse 0 points 0 points 0 points  Repeat phrase 0 points 2 points 0 points  Total Score 0 2 0    Immunizations Immunization History  Administered Date(s) Administered  . Fluad Quad(high Dose 65+) 05/31/2019  . Influenza Split 06/13/2013, 06/13/2014  . Influenza Whole 06/13/2016  . Influenza, High Dose Seasonal PF 06/22/2018  . Influenza-Unspecified 05/26/2017, 06/23/2020  . PFIZER SARS-COV-2 Vaccination 10/19/2019, 11/13/2019  . Pneumococcal Conjugate-13 02/04/2016  . Pneumococcal Polysaccharide-23 04/21/2011  . Tdap 03/27/2009, 09/11/2018  . Zoster 08/02/2011  . Zoster Recombinat (Shingrix) 03/08/2017, 05/26/2017    TDAP status: Up to date Flu Vaccine status: Up to  date Pneumococcal vaccine status: Up to date Covid-19 vaccine status: Completed vaccines  Qualifies for Shingles Vaccine? Yes   Zostavax completed Yes   Shingrix Completed?: Yes  Screening Tests Health Maintenance  Topic Date Due  . URINE MICROALBUMIN  Never done  . COLONOSCOPY  03/10/2020  . MAMMOGRAM  06/23/2022  . TETANUS/TDAP  09/11/2028  . INFLUENZA VACCINE  Completed  . DEXA SCAN  Completed  . COVID-19 Vaccine  Completed  . Hepatitis C Screening  Completed  . PNA vac Low Risk Adult  Discontinued    Health Maintenance  Health Maintenance Due  Topic Date Due  . URINE MICROALBUMIN  Never done  . COLONOSCOPY  03/10/2020    Colorectal cancer screening: Completed 61950932. Repeat every 10 years Mammogram status: Completed [id number]. Repeat every year Bone Density status: Completed [id number]. Results reflect: Bone density results: NORMAL. Repeat every 3 years.  Lung Cancer Screening: (Low Dose CT Chest recommended if Age 101-80 years, 30 pack-year currently smoking OR have quit w/in 15years.) does not qualify.   Lung Cancer Screening Referral:   Additional Screening:  Hepatitis C Screening: does qualify; Completed Patient declined  Vision Screening: Recommended annual ophthalmology exams for early detection of glaucoma and other disorders of the eye. Is the patient up to date with their annual eye exam?  Yes  Who is the provider or what is the name of the office in which the patient attends annual eye exams? Dr. Macarthur Critchley If pt is not established with a provider, would they like to be referred to a provider to establish care? No .   Dental Screening: Recommended annual dental exams for proper oral hygiene  Community Resource Referral / Chronic Care Management: CRR required this visit?  No   CCM required this visit?  No      Plan:  -Continue with current medication regimen. Not due for refills yet- advised to request refills when due in December (ok to provide 2 RF  on rx). Reports tolerating Crestor better than Lipitor. -Continue Miralax as needed for constipation.  -Will notify of lab results once available.  -Follow up in 6 months for reg OV: HLD  I have personally reviewed and noted the following in the patient's chart:   . Medical and social history . Use of alcohol, tobacco or illicit drugs  . Current medications and supplements . Functional ability and status . Nutritional status . Physical activity . Advanced directives . List of other physicians . Hospitalizations, surgeries, and ER visits in previous 12 months . Vitals . Screenings to include cognitive, depression, and falls . Referrals and appointments  In addition, I have reviewed and discussed with patient certain preventive protocols, quality metrics, and best practice recommendations. A written personalized care plan for preventive services as well as general preventive health recommendations were provided to patient.

## 2020-07-17 LAB — CBC
Hematocrit: 36 % (ref 34.0–46.6)
Hemoglobin: 11.6 g/dL (ref 11.1–15.9)
MCH: 28.3 pg (ref 26.6–33.0)
MCHC: 32.2 g/dL (ref 31.5–35.7)
MCV: 88 fL (ref 79–97)
Platelets: 208 10*3/uL (ref 150–450)
RBC: 4.1 x10E6/uL (ref 3.77–5.28)
RDW: 14.3 % (ref 11.7–15.4)
WBC: 3.8 10*3/uL (ref 3.4–10.8)

## 2020-07-17 LAB — COMPREHENSIVE METABOLIC PANEL
ALT: 12 IU/L (ref 0–32)
AST: 16 IU/L (ref 0–40)
Albumin/Globulin Ratio: 2.4 — ABNORMAL HIGH (ref 1.2–2.2)
Albumin: 4.5 g/dL (ref 3.8–4.8)
Alkaline Phosphatase: 61 IU/L (ref 44–121)
BUN/Creatinine Ratio: 19 (ref 12–28)
BUN: 17 mg/dL (ref 8–27)
Bilirubin Total: 0.5 mg/dL (ref 0.0–1.2)
CO2: 24 mmol/L (ref 20–29)
Calcium: 10.2 mg/dL (ref 8.7–10.3)
Chloride: 106 mmol/L (ref 96–106)
Creatinine, Ser: 0.89 mg/dL (ref 0.57–1.00)
GFR calc Af Amer: 76 mL/min/{1.73_m2} (ref >59)
GFR calc non Af Amer: 66 mL/min/{1.73_m2} (ref >59)
Globulin, Total: 1.9 g/dL (ref 1.5–4.5)
Glucose: 93 mg/dL (ref 65–99)
Potassium: 4.7 mmol/L (ref 3.5–5.2)
Sodium: 141 mmol/L (ref 134–144)
Total Protein: 6.4 g/dL (ref 6.0–8.5)

## 2020-07-17 LAB — LIPID PANEL
Chol/HDL Ratio: 3.1 ratio (ref 0.0–4.4)
Cholesterol, Total: 172 mg/dL (ref 100–199)
HDL: 55 mg/dL (ref >39)
LDL Chol Calc (NIH): 104 mg/dL — ABNORMAL HIGH (ref 0–99)
Triglycerides: 68 mg/dL (ref 0–149)
VLDL Cholesterol Cal: 13 mg/dL (ref 5–40)

## 2020-07-17 LAB — HEMOGLOBIN A1C
Est. average glucose Bld gHb Est-mCnc: 114 mg/dL
Hgb A1c MFr Bld: 5.6 % (ref 4.8–5.6)

## 2020-07-17 LAB — VITAMIN D 25 HYDROXY (VIT D DEFICIENCY, FRACTURES): Vit D, 25-Hydroxy: 40.2 ng/mL (ref 30.0–100.0)

## 2020-07-17 LAB — TSH: TSH: 3.6 u[IU]/mL (ref 0.450–4.500)

## 2020-07-21 DIAGNOSIS — N6002 Solitary cyst of left breast: Secondary | ICD-10-CM | POA: Diagnosis not present

## 2020-07-21 LAB — HM MAMMOGRAPHY

## 2020-07-25 ENCOUNTER — Encounter: Payer: Self-pay | Admitting: Physician Assistant

## 2020-08-18 ENCOUNTER — Encounter: Payer: Self-pay | Admitting: Physician Assistant

## 2020-08-18 DIAGNOSIS — Z1211 Encounter for screening for malignant neoplasm of colon: Secondary | ICD-10-CM

## 2020-08-18 DIAGNOSIS — J45991 Cough variant asthma: Secondary | ICD-10-CM

## 2020-08-18 DIAGNOSIS — J45909 Unspecified asthma, uncomplicated: Secondary | ICD-10-CM

## 2020-08-18 MED ORDER — MONTELUKAST SODIUM 10 MG PO TABS: 10.0000 mg | ORAL_TABLET | Freq: Every day | ORAL | 1 refills | Status: DC

## 2020-08-18 MED ORDER — BUDESONIDE-FORMOTEROL FUMARATE 80-4.5 MCG/ACT IN AERO: 2.0000 | INHALATION_SPRAY | Freq: Two times a day (BID) | RESPIRATORY_TRACT | 3 refills | Status: DC

## 2020-08-21 DIAGNOSIS — M1712 Unilateral primary osteoarthritis, left knee: Secondary | ICD-10-CM | POA: Diagnosis not present

## 2020-08-25 ENCOUNTER — Encounter: Payer: Self-pay | Admitting: Physician Assistant

## 2020-08-28 DIAGNOSIS — M1712 Unilateral primary osteoarthritis, left knee: Secondary | ICD-10-CM | POA: Diagnosis not present

## 2020-09-04 DIAGNOSIS — M1712 Unilateral primary osteoarthritis, left knee: Secondary | ICD-10-CM | POA: Diagnosis not present

## 2020-10-03 DIAGNOSIS — D2371 Other benign neoplasm of skin of right lower limb, including hip: Secondary | ICD-10-CM | POA: Diagnosis not present

## 2020-10-03 DIAGNOSIS — D225 Melanocytic nevi of trunk: Secondary | ICD-10-CM | POA: Diagnosis not present

## 2020-10-03 DIAGNOSIS — L820 Inflamed seborrheic keratosis: Secondary | ICD-10-CM | POA: Diagnosis not present

## 2020-10-03 DIAGNOSIS — L821 Other seborrheic keratosis: Secondary | ICD-10-CM | POA: Diagnosis not present

## 2020-10-03 DIAGNOSIS — D2239 Melanocytic nevi of other parts of face: Secondary | ICD-10-CM | POA: Diagnosis not present

## 2020-10-03 DIAGNOSIS — D1801 Hemangioma of skin and subcutaneous tissue: Secondary | ICD-10-CM | POA: Diagnosis not present

## 2020-10-18 DIAGNOSIS — Z03818 Encounter for observation for suspected exposure to other biological agents ruled out: Secondary | ICD-10-CM | POA: Diagnosis not present

## 2020-10-18 DIAGNOSIS — Z20822 Contact with and (suspected) exposure to covid-19: Secondary | ICD-10-CM | POA: Diagnosis not present

## 2020-11-06 DIAGNOSIS — Z20822 Contact with and (suspected) exposure to covid-19: Secondary | ICD-10-CM | POA: Diagnosis not present

## 2020-12-02 ENCOUNTER — Encounter: Payer: Self-pay | Admitting: Physician Assistant

## 2020-12-02 DIAGNOSIS — J45909 Unspecified asthma, uncomplicated: Secondary | ICD-10-CM

## 2020-12-02 DIAGNOSIS — J45991 Cough variant asthma: Secondary | ICD-10-CM

## 2020-12-02 MED ORDER — BUDESONIDE-FORMOTEROL FUMARATE 80-4.5 MCG/ACT IN AERO: 2.0000 | INHALATION_SPRAY | Freq: Two times a day (BID) | RESPIRATORY_TRACT | 3 refills | Status: DC

## 2021-01-08 ENCOUNTER — Other Ambulatory Visit: Payer: Self-pay

## 2021-01-08 DIAGNOSIS — E785 Hyperlipidemia, unspecified: Secondary | ICD-10-CM

## 2021-01-08 DIAGNOSIS — E7800 Pure hypercholesterolemia, unspecified: Secondary | ICD-10-CM

## 2021-01-08 DIAGNOSIS — Z0000 Encounter for general adult medical examination without abnormal findings: Secondary | ICD-10-CM

## 2021-01-14 ENCOUNTER — Other Ambulatory Visit: Payer: PPO

## 2021-01-14 ENCOUNTER — Other Ambulatory Visit: Payer: Self-pay

## 2021-01-14 DIAGNOSIS — Z0000 Encounter for general adult medical examination without abnormal findings: Secondary | ICD-10-CM | POA: Diagnosis not present

## 2021-01-14 DIAGNOSIS — E785 Hyperlipidemia, unspecified: Secondary | ICD-10-CM

## 2021-01-14 DIAGNOSIS — E7800 Pure hypercholesterolemia, unspecified: Secondary | ICD-10-CM | POA: Diagnosis not present

## 2021-01-15 LAB — COMPREHENSIVE METABOLIC PANEL
ALT: 18 IU/L (ref 0–32)
AST: 20 IU/L (ref 0–40)
Albumin/Globulin Ratio: 2.3 — ABNORMAL HIGH (ref 1.2–2.2)
Albumin: 4.3 g/dL (ref 3.8–4.8)
Alkaline Phosphatase: 64 IU/L (ref 44–121)
BUN/Creatinine Ratio: 28 (ref 12–28)
BUN: 23 mg/dL (ref 8–27)
Bilirubin Total: 0.6 mg/dL (ref 0.0–1.2)
CO2: 22 mmol/L (ref 20–29)
Calcium: 10 mg/dL (ref 8.7–10.3)
Chloride: 106 mmol/L (ref 96–106)
Creatinine, Ser: 0.81 mg/dL (ref 0.57–1.00)
Globulin, Total: 1.9 g/dL (ref 1.5–4.5)
Glucose: 95 mg/dL (ref 65–99)
Potassium: 4.4 mmol/L (ref 3.5–5.2)
Sodium: 141 mmol/L (ref 134–144)
Total Protein: 6.2 g/dL (ref 6.0–8.5)
eGFR: 79 mL/min/{1.73_m2} (ref >59)

## 2021-01-15 LAB — LIPID PANEL
Chol/HDL Ratio: 2.5 ratio (ref 0.0–4.4)
Cholesterol, Total: 158 mg/dL (ref 100–199)
HDL: 63 mg/dL (ref >39)
LDL Chol Calc (NIH): 84 mg/dL (ref 0–99)
Triglycerides: 52 mg/dL (ref 0–149)
VLDL Cholesterol Cal: 11 mg/dL (ref 5–40)

## 2021-02-02 ENCOUNTER — Ambulatory Visit (INDEPENDENT_AMBULATORY_CARE_PROVIDER_SITE_OTHER): Payer: PPO | Admitting: Physician Assistant

## 2021-02-02 ENCOUNTER — Other Ambulatory Visit: Payer: Self-pay

## 2021-02-02 ENCOUNTER — Encounter: Payer: Self-pay | Admitting: Physician Assistant

## 2021-02-02 VITALS — BP 113/74 | HR 75 | Temp 98.8°F | Ht 70.0 in | Wt 210.2 lb

## 2021-02-02 DIAGNOSIS — E7800 Pure hypercholesterolemia, unspecified: Secondary | ICD-10-CM

## 2021-02-02 DIAGNOSIS — J45991 Cough variant asthma: Secondary | ICD-10-CM | POA: Diagnosis not present

## 2021-02-02 DIAGNOSIS — J45909 Unspecified asthma, uncomplicated: Secondary | ICD-10-CM | POA: Diagnosis not present

## 2021-02-02 MED ORDER — MONTELUKAST SODIUM 10 MG PO TABS: 10.0000 mg | ORAL_TABLET | Freq: Every day | ORAL | 1 refills | Status: DC

## 2021-02-02 MED ORDER — BUDESONIDE-FORMOTEROL FUMARATE 80-4.5 MCG/ACT IN AERO: 2.0000 | INHALATION_SPRAY | Freq: Two times a day (BID) | RESPIRATORY_TRACT | 3 refills | Status: DC

## 2021-02-02 NOTE — Patient Instructions (Signed)
High Cholesterol  High cholesterol is a condition in which the blood has high levels of a white, waxy substance similar to fat (cholesterol). The liver makes all the cholesterol that the body needs. The human body needs small amounts of cholesterol to help build cells. A person gets extra or excess cholesterol from the food that he or she eats. The blood carries cholesterol from the liver to the rest of the body. If you have high cholesterol, deposits (plaques) may build up on the walls of your arteries. Arteries are the blood vessels that carry blood away from your heart. These plaques make the arteries narrow and stiff. Cholesterol plaques increase your risk for heart attack and stroke. Work with your health care provider to keep your cholesterol levels in a healthy range. What increases the risk? The following factors may make you more likely to develop this condition:  Eating foods that are high in animal fat (saturated fat) or cholesterol.  Being overweight.  Not getting enough exercise.  A family history of high cholesterol (familial hypercholesterolemia).  Use of tobacco products.  Having diabetes. What are the signs or symptoms? There are no symptoms of this condition. How is this diagnosed? This condition may be diagnosed based on the results of a blood test.  If you are older than 70 years of age, your health care provider may check your cholesterol levels every 4-6 years.  You may be checked more often if you have high cholesterol or other risk factors for heart disease. The blood test for cholesterol measures:  "Bad" cholesterol, or LDL cholesterol. This is the main type of cholesterol that causes heart disease. The desired level is less than 100 mg/dL.  "Good" cholesterol, or HDL cholesterol. HDL helps protect against heart disease by cleaning the arteries and carrying the LDL to the liver for processing. The desired level for HDL is 60 mg/dL or higher.  Triglycerides.  These are fats that your body can store or burn for energy. The desired level is less than 150 mg/dL.  Total cholesterol. This measures the total amount of cholesterol in your blood and includes LDL, HDL, and triglycerides. The desired level is less than 200 mg/dL. How is this treated? This condition may be treated with:  Diet changes. You may be asked to eat foods that have more fiber and less saturated fats or added sugar.  Lifestyle changes. These may include regular exercise, maintaining a healthy weight, and quitting use of tobacco products.  Medicines. These are given when diet and lifestyle changes have not worked. You may be prescribed a statin medicine to help lower your cholesterol levels. Follow these instructions at home: Eating and drinking  Eat a healthy, balanced diet. This diet includes: ? Daily servings of a variety of fresh, frozen, or canned fruits and vegetables. ? Daily servings of whole grain foods that are rich in fiber. ? Foods that are low in saturated fats and trans fats. These include poultry and fish without skin, lean cuts of meat, and low-fat dairy products. ? A variety of fish, especially oily fish that contain omega-3 fatty acids. Aim to eat fish at least 2 times a week.  Avoid foods and drinks that have added sugar.  Use healthy cooking methods, such as roasting, grilling, broiling, baking, poaching, steaming, and stir-frying. Do not fry your food except for stir-frying.   Lifestyle  Get regular exercise. Aim to exercise for a total of 150 minutes a week. Increase your activity level by doing activities   such as gardening, walking, and taking the stairs.  Do not use any products that contain nicotine or tobacco, such as cigarettes, e-cigarettes, and chewing tobacco. If you need help quitting, ask your health care provider.   General instructions  Take over-the-counter and prescription medicines only as told by your health care provider.  Keep all  follow-up visits as told by your health care provider. This is important. Where to find more information  American Heart Association: www.heart.org  National Heart, Lung, and Blood Institute: www.nhlbi.nih.gov Contact a health care provider if:  You have trouble achieving or maintaining a healthy diet or weight.  You are starting an exercise program.  You are unable to stop smoking. Get help right away if:  You have chest pain.  You have trouble breathing.  You have any symptoms of a stroke. "BE FAST" is an easy way to remember the main warning signs of a stroke: ? B - Balance. Signs are dizziness, sudden trouble walking, or loss of balance. ? E - Eyes. Signs are trouble seeing or a sudden change in vision. ? F - Face. Signs are sudden weakness or numbness of the face, or the face or eyelid drooping on one side. ? A - Arms. Signs are weakness or numbness in an arm. This happens suddenly and usually on one side of the body. ? S - Speech. Signs are sudden trouble speaking, slurred speech, or trouble understanding what people say. ? T - Time. Time to call emergency services. Write down what time symptoms started.  You have other signs of a stroke, such as: ? A sudden, severe headache with no known cause. ? Nausea or vomiting. ? Seizure. These symptoms may represent a serious problem that is an emergency. Do not wait to see if the symptoms will go away. Get medical help right away. Call your local emergency services (911 in the [place] Do not drive yourself to the hospital. Summary  Cholesterol plaques increase your risk for heart attack and stroke. Work with your health care provider to keep your cholesterol levels in a healthy range.  Eat a healthy, balanced diet, get regular exercise, and maintain a healthy weight.  Do not use any products that contain nicotine or tobacco, such as cigarettes, e-cigarettes, and chewing tobacco.  Get help right away if you have any symptoms of a  stroke. This information is not intended to replace advice given to you by your health care provider. Make sure you discuss any questions you have with your health care provider. Document Revised: 07/30/2019 Document Reviewed: 07/30/2019 Elsevier Patient Education  2021 Elsevier Inc.  

## 2021-02-02 NOTE — Progress Notes (Signed)
Established Patient Office Visit  Subjective:  Patient ID: Cindy Rogers, female    DOB: 02/28/1951  Age: 70 y.o. MRN: [id number]  CC:  Chief Complaint  Patient presents with  . Follow-up  . Hyperlipidemia    HPI Cindy Rogers presents for follow up on hyperlipidemia. Pt taking medication as directed without issues. Reports intermittent muscle cramps when she has been very active. Reports cramps will improve with mustard. States had significant myalgias with atorvastatin which is not the same with rosuvastatin. Patient stays active with playing golf.  Asthma: Reports medication compliance. Denies wheezing or shortness of breath. States has not had issues with allergies.   Past Medical History:  Diagnosis Date  . Asthma   . Asthma    Phreesia 07/15/2020  . GERD (gastroesophageal reflux disease)   . Headache(784.0)   . Pneumonia   . PONV (postoperative nausea and vomiting)   . Shortness of breath     Past Surgical History:  Procedure Laterality Date  . ABDOMINAL HYSTERECTOMY    . CHOLECYSTECTOMY    . RHINOPLASTY      Family History  Problem Relation Age of Onset  . Emphysema Father        smoked  . Heart disease Father   . Kidney cancer Father        with mets to liver and retina  . Heart disease Mother     Social History   Socioeconomic History  . Marital status: Married    Spouse name: Not on file  . Number of children: Not on file  . Years of education: Not on file  . Highest education level: Not on file  Occupational History  . Occupation: Retired Associate Professor: [employer]  Tobacco Use  . Smoking status: Never Smoker  . Smokeless tobacco: Never Used  Substance and Sexual Activity  . Alcohol use: Yes    Comment: glass of wine approx twice per wk  . Drug use: No  . Sexual activity: Yes    Birth control/protection: Post-menopausal  Other Topics Concern  . Not on file  Social History Narrative  . Not on file   Social Determinants of  Health   Financial Resource Strain: Not on file  Food Insecurity: Not on file  Transportation Needs: Not on file  Physical Activity: Not on file  Stress: Not on file  Social Connections: Not on file  Intimate Partner Violence: Not on file    Outpatient Medications Prior to Visit  Medication Sig Dispense Refill  . albuterol (PROAIR HFA) 108 (90 Base) MCG/ACT inhaler INHALE 2 PUFFS EVERY 4 HOURS AS NEEDED ONLY IF YOU CAN'T CATCH YOUR BREATH. 8.5 g 1  . calcium-vitamin D (OSCAL WITH D) 500-200 MG-UNIT per tablet Take 1 tablet by mouth daily.    . cholecalciferol (VITAMIN D) 1000 UNITS tablet Take 2,000 Units by mouth daily.     Marland Kitchen esomeprazole (NEXIUM) 20 MG capsule Take 20 mg by mouth daily at 12 noon.    . famotidine (PEPCID) 20 MG tablet Take by mouth one at bedtime    . fluticasone (FLONASE) 50 MCG/ACT nasal spray Place 2 sprays into the nose daily as needed. For allergies    . guaiFENesin (MUCINEX) 600 MG 12 hr tablet Take by mouth 2 (two) times daily as needed.    Marland Kitchen ibuprofen (ADVIL,MOTRIN) 200 MG tablet Take 600 mg by mouth every 6 (six) hours as needed. For pain    . naproxen (NAPROSYN)  500 MG tablet Take 500 mg by mouth as needed.    . polyethylene glycol (MIRALAX / GLYCOLAX) 17 g packet Take 17 g by mouth daily. 2-3 times weekly    . rosuvastatin (CRESTOR) 5 MG tablet Take 1 tablet (5 mg total) by mouth once a week. 90 tablet 1  . budesonide-formoterol (SYMBICORT) 80-4.5 MCG/ACT inhaler Inhale 2 puffs into the lungs 2 (two) times daily. 6.9 g 3  . montelukast (SINGULAIR) 10 MG tablet Take 1 tablet (10 mg total) by mouth at bedtime. 90 tablet 1  . Probiotic Product (CULTURELLE PROBIOTICS PO) Take by mouth.     No facility-administered medications prior to visit.    Allergies  Allergen Reactions  . Food Anaphylaxis    "tree nuts"  . Shellfish Allergy Shortness Of Breath  . Codeine     "wild dreams"  . Penicillins     "ashmatic" 09-30-2018 pt has taken in the past year and  had no problems was told not to take since asthmatic.   . Sulfa Antibiotics Nausea And Vomiting  . Neosporin Af [Miconazole]     Rash    ROS Review of Systems Review of Systems:  A fourteen system review of systems was performed and found to be positive as per HPI.   Objective:    Physical Exam General:  Well Developed, well nourished, appropriate for stated age.  Neuro:  Alert and oriented,  extra-ocular muscles intact  HEENT:  Normocephalic, atraumatic, neck supple Skin:  no gross rash, warm, pink. Cardiac:  RRR Respiratory:  ECTA B/L w/o wheezing, crackles or rales, Not using accessory muscles, speaking in full sentences- unlabored. Vascular:  Ext warm, no cyanosis apprec.; cap RF less 2 sec. Psych:  No HI/SI, judgement and insight good, Euthymic mood. Full Affect.   BP 113/74   Pulse 75   Temp 98.8 F (37.1 C)   Ht _0  (1.778 m)   Wt 210 lb 3.2 oz (95.3 kg)   SpO2 98%   BMI 30.16 kg/m  Wt Readings from Last 3 Encounters:  02/02/21 210 lb 3.2 oz (95.3 kg)  07/16/20 225 lb 6.4 oz (102.2 kg)  03/03/20 219 lb 9.6 oz (99.6 kg)     Health Maintenance Due  Topic Date Due  . URINE MICROALBUMIN  Never done  . COVID-19 Vaccine (3 - Booster for Pfizer series) 04/14/2020    There are no preventive care reminders to display for this patient.  Lab Results  Component Value Date   TSH 3.600 07/16/2020   Lab Results  Component Value Date   WBC 3.8 07/16/2020   HGB 11.6 07/16/2020   HCT 36.0 07/16/2020   MCV 88 07/16/2020   PLT 208 07/16/2020   Lab Results  Component Value Date   NA 141 01/14/2021   K 4.4 01/14/2021   CO2 22 01/14/2021   GLUCOSE 95 01/14/2021   BUN 23 01/14/2021   CREATININE 0.81 01/14/2021   BILITOT 0.6 01/14/2021   ALKPHOS 64 01/14/2021   AST 20 01/14/2021   ALT 18 01/14/2021   PROT 6.2 01/14/2021   ALBUMIN 4.3 01/14/2021   CALCIUM 10.0 01/14/2021   EGFR 79 01/14/2021   Lab Results  Component Value Date   CHOL 158 01/14/2021    Lab Results  Component Value Date   HDL 63 01/14/2021   Lab Results  Component Value Date   LDLCALC 84 01/14/2021   Lab Results  Component Value Date   TRIG 52 01/14/2021   Lab Results  Component Value Date   CHOLHDL 2.5 01/14/2021   Lab Results  Component Value Date   HGBA1C 5.6 07/16/2020      Assessment & Plan:   Problem List Items Addressed This Visit      Respiratory   Intrinsic asthma   Relevant Medications   budesonide-formoterol (SYMBICORT) 80-4.5 MCG/ACT inhaler (Start on 03/27/2021)   montelukast (SINGULAIR) 10 MG tablet   Cough variant asthma   Relevant Medications   budesonide-formoterol (SYMBICORT) 80-4.5 MCG/ACT inhaler (Start on 03/27/2021)   montelukast (SINGULAIR) 10 MG tablet     Other   Elevated LDL cholesterol level - Primary     Elevated LDL cholesterol level: -Discussed recent lipid panel which has significantly improved. LDL 84 (prior 104). -Continue rosuvastatin 5 mg once a week. Continue good hydration to reduce potential SE and recommend to trial CoQ10. -Recent hepatic function normal. -Will continue to monitor.  Intrinsic asthma, Cough variant asthma: -Stable. -Continue montelukast and Symbicort. Provided future refill for Symbicort. Provided refill for montelukast, will be due 02/16/2021. -Will continue to monitor.  Meds ordered this encounter  Medications  . budesonide-formoterol (SYMBICORT) 80-4.5 MCG/ACT inhaler    Sig: Inhale 2 puffs into the lungs 2 (two) times daily.    Dispense:  6.9 g    Refill:  3    Order Specific Question:   Supervising Provider    Answer:   Beatrice Lecher D [[id number]]    Order Specific Question:   Lot Number?    Answer:   [id number]    Order Specific Question:   Expiration Date?    Answer:   07/14/2017    Comments:   a    Order Specific Question:   Manufacturer?    Answer:   Astra[employer]en[employer] [[id number]]    Order Specific Question:   Quantity    Answer:   1  . montelukast (SINGULAIR) 10 MG tablet     Sig: Take 1 tablet (10 mg total) by mouth at bedtime.    Dispense:  90 tablet    Refill:  1    Order Specific Question:   Supervising Provider    Answer:   Beatrice Lecher D [[id number]]    Follow-up: Return in about 6 months (around 08/05/2021) for [facility] and [facility] few days prior.   Note:  This note was prepared with assistance of Dragon voice recognition software. Occasional wrong-word or sound-a-like substitutions may have occurred due to the inherent limitations of voice recognition software.  Lorrene Reid, PA-C

## 2021-06-08 ENCOUNTER — Encounter: Payer: Self-pay | Admitting: Physician Assistant

## 2021-06-08 DIAGNOSIS — J45909 Unspecified asthma, uncomplicated: Secondary | ICD-10-CM

## 2021-06-08 DIAGNOSIS — J45991 Cough variant asthma: Secondary | ICD-10-CM

## 2021-06-08 MED ORDER — BUDESONIDE-FORMOTEROL FUMARATE 80-4.5 MCG/ACT IN AERO: 2.0000 | INHALATION_SPRAY | Freq: Two times a day (BID) | RESPIRATORY_TRACT | 3 refills | Status: DC

## 2021-06-25 DIAGNOSIS — M1712 Unilateral primary osteoarthritis, left knee: Secondary | ICD-10-CM | POA: Diagnosis not present

## 2021-06-29 DIAGNOSIS — Z1231 Encounter for screening mammogram for malignant neoplasm of breast: Secondary | ICD-10-CM | POA: Diagnosis not present

## 2021-06-29 LAB — HM MAMMOGRAPHY

## 2021-07-01 ENCOUNTER — Encounter: Payer: Self-pay | Admitting: Physician Assistant

## 2021-07-02 DIAGNOSIS — M1712 Unilateral primary osteoarthritis, left knee: Secondary | ICD-10-CM | POA: Diagnosis not present

## 2021-07-09 DIAGNOSIS — M1712 Unilateral primary osteoarthritis, left knee: Secondary | ICD-10-CM | POA: Diagnosis not present

## 2021-08-13 ENCOUNTER — Other Ambulatory Visit: Payer: Self-pay

## 2021-08-13 DIAGNOSIS — Z0000 Encounter for general adult medical examination without abnormal findings: Secondary | ICD-10-CM

## 2021-08-13 DIAGNOSIS — E7800 Pure hypercholesterolemia, unspecified: Secondary | ICD-10-CM

## 2021-08-13 DIAGNOSIS — R7989 Other specified abnormal findings of blood chemistry: Secondary | ICD-10-CM

## 2021-08-13 DIAGNOSIS — E785 Hyperlipidemia, unspecified: Secondary | ICD-10-CM

## 2021-08-14 ENCOUNTER — Other Ambulatory Visit: Payer: Self-pay

## 2021-08-14 ENCOUNTER — Other Ambulatory Visit: Payer: PPO

## 2021-08-14 DIAGNOSIS — E785 Hyperlipidemia, unspecified: Secondary | ICD-10-CM

## 2021-08-14 DIAGNOSIS — Z0000 Encounter for general adult medical examination without abnormal findings: Secondary | ICD-10-CM

## 2021-08-14 DIAGNOSIS — E7800 Pure hypercholesterolemia, unspecified: Secondary | ICD-10-CM | POA: Diagnosis not present

## 2021-08-14 DIAGNOSIS — R7989 Other specified abnormal findings of blood chemistry: Secondary | ICD-10-CM

## 2021-08-15 LAB — CBC WITH DIFFERENTIAL/PLATELET
Basophils Absolute: 0.1 10*3/uL (ref 0.0–0.2)
Basos: 1 %
EOS (ABSOLUTE): 0.1 10*3/uL (ref 0.0–0.4)
Eos: 3 %
Hematocrit: 37.1 % (ref 34.0–46.6)
Hemoglobin: 12.3 g/dL (ref 11.1–15.9)
Immature Grans (Abs): 0 10*3/uL (ref 0.0–0.1)
Immature Granulocytes: 0 %
Lymphocytes Absolute: 1.3 10*3/uL (ref 0.7–3.1)
Lymphs: 29 %
MCH: 28.3 pg (ref 26.6–33.0)
MCHC: 33.2 g/dL (ref 31.5–35.7)
MCV: 86 fL (ref 79–97)
Monocytes Absolute: 0.4 10*3/uL (ref 0.1–0.9)
Monocytes: 10 %
Neutrophils Absolute: 2.5 10*3/uL (ref 1.4–7.0)
Neutrophils: 57 %
Platelets: 198 10*3/uL (ref 150–450)
RBC: 4.34 x10E6/uL (ref 3.77–5.28)
RDW: 15.7 % — ABNORMAL HIGH (ref 11.7–15.4)
WBC: 4.4 10*3/uL (ref 3.4–10.8)

## 2021-08-15 LAB — COMPREHENSIVE METABOLIC PANEL
ALT: 13 IU/L (ref 0–32)
AST: 16 IU/L (ref 0–40)
Albumin/Globulin Ratio: 2 (ref 1.2–2.2)
Albumin: 4.3 g/dL (ref 3.8–4.8)
Alkaline Phosphatase: 63 IU/L (ref 44–121)
BUN/Creatinine Ratio: 15 (ref 12–28)
BUN: 14 mg/dL (ref 8–27)
Bilirubin Total: 0.4 mg/dL (ref 0.0–1.2)
CO2: 24 mmol/L (ref 20–29)
Calcium: 10.1 mg/dL (ref 8.7–10.3)
Chloride: 106 mmol/L (ref 96–106)
Creatinine, Ser: 0.91 mg/dL (ref 0.57–1.00)
Globulin, Total: 2.2 g/dL (ref 1.5–4.5)
Glucose: 87 mg/dL (ref 70–99)
Potassium: 4.4 mmol/L (ref 3.5–5.2)
Sodium: 142 mmol/L (ref 134–144)
Total Protein: 6.5 g/dL (ref 6.0–8.5)
eGFR: 68 mL/min/{1.73_m2} (ref >59)

## 2021-08-15 LAB — TSH: TSH: 3.54 u[IU]/mL (ref 0.450–4.500)

## 2021-08-15 LAB — HEMOGLOBIN A1C
Est. average glucose Bld gHb Est-mCnc: 114 mg/dL
Hgb A1c MFr Bld: 5.6 % (ref 4.8–5.6)

## 2021-08-15 LAB — LIPID PANEL
Chol/HDL Ratio: 2.8 ratio (ref 0.0–4.4)
Cholesterol, Total: 181 mg/dL (ref 100–199)
HDL: 65 mg/dL (ref >39)
LDL Chol Calc (NIH): 105 mg/dL — ABNORMAL HIGH (ref 0–99)
Triglycerides: 54 mg/dL (ref 0–149)
VLDL Cholesterol Cal: 11 mg/dL (ref 5–40)

## 2021-08-17 ENCOUNTER — Encounter: Payer: Self-pay | Admitting: Physician Assistant

## 2021-08-17 ENCOUNTER — Other Ambulatory Visit: Payer: Self-pay

## 2021-08-17 ENCOUNTER — Ambulatory Visit (INDEPENDENT_AMBULATORY_CARE_PROVIDER_SITE_OTHER): Payer: PPO | Admitting: Physician Assistant

## 2021-08-17 VITALS — BP 116/74 | HR 72 | Temp 97.9°F | Ht 70.0 in | Wt 220.0 lb

## 2021-08-17 DIAGNOSIS — J45909 Unspecified asthma, uncomplicated: Secondary | ICD-10-CM | POA: Diagnosis not present

## 2021-08-17 DIAGNOSIS — Z0000 Encounter for general adult medical examination without abnormal findings: Secondary | ICD-10-CM

## 2021-08-17 DIAGNOSIS — Z13820 Encounter for screening for osteoporosis: Secondary | ICD-10-CM | POA: Diagnosis not present

## 2021-08-17 DIAGNOSIS — J45991 Cough variant asthma: Secondary | ICD-10-CM | POA: Diagnosis not present

## 2021-08-17 MED ORDER — MONTELUKAST SODIUM 10 MG PO TABS: 10.0000 mg | ORAL_TABLET | Freq: Every day | ORAL | 0 refills | Status: DC

## 2021-08-17 NOTE — Patient Instructions (Signed)
Preventive Care 40 Years and Older, Female Preventive care refers to lifestyle choices and visits with your health care provider that can promote health and wellness. Preventive care visits are also called wellness exams. What can I expect for my preventive care visit? Counseling Your health care provider may ask you questions about your: Medical history, including: Past medical problems. Family medical history. Pregnancy and menstrual history. History of falls. Current health, including: Memory and ability to understand (cognition). Emotional well-being. Home life and relationship well-being. Sexual activity and sexual health. Lifestyle, including: Alcohol, nicotine or tobacco, and drug use. Access to firearms. Diet, exercise, and sleep habits. Work and work Statistician. Sunscreen use. Safety issues such as seatbelt and bike helmet use. Physical exam Your health care provider will check your: Height and weight. These may be used to calculate your BMI (body mass index). BMI is a measurement that tells if you are at a healthy weight. Waist circumference. This measures the distance around your waistline. This measurement also tells if you are at a healthy weight and may help predict your risk of certain diseases, such as type 2 diabetes and high blood pressure. Heart rate and blood pressure. Body temperature. Skin for abnormal spots. What immunizations do I need? Vaccines are usually given at various ages, according to a schedule. Your health care provider will recommend vaccines for you based on your age, medical history, and lifestyle or other factors, such as travel or where you work. What tests do I need? Screening Your health care provider may recommend screening tests for certain conditions. This may include: Lipid and cholesterol levels. Hepatitis C test. Hepatitis B test. HIV (human immunodeficiency virus) test. STI (sexually transmitted infection) testing, if you are at  risk. Lung cancer screening. Colorectal cancer screening. Diabetes screening. This is done by checking your blood sugar (glucose) after you have not eaten for a while (fasting). Mammogram. Talk with your health care provider about how often you should have regular mammograms. BRCA-related cancer screening. This may be done if you have a family history of breast, ovarian, tubal, or peritoneal cancers. Bone density scan. This is done to screen for osteoporosis. Talk with your health care provider about your test results, treatment options, and if necessary, the need for more tests. Follow these instructions at home: Eating and drinking  Eat a diet that includes fresh fruits and vegetables, whole grains, lean protein, and low-fat dairy products. Limit your intake of foods with high amounts of sugar, saturated fats, and salt. Take vitamin and mineral supplements as recommended by your health care provider. Do not drink alcohol if your health care provider tells you not to drink. If you drink alcohol: Limit how much you have to 0-1 drink a day. Know how much alcohol is in your drink. In the [place] one drink equals one 12 oz bottle of beer (355 mL), one 5 oz glass of wine (148 mL), or one 1 oz glass of hard liquor (44 mL). Lifestyle Brush your teeth every morning and night with fluoride toothpaste. Floss one time each day. Exercise for at least 30 minutes 5 or more days each week. Do not use any products that contain nicotine or tobacco. These products include cigarettes, chewing tobacco, and vaping devices, such as e-cigarettes. If you need help quitting, ask your health care provider. Do not use drugs. If you are sexually active, practice safe sex. Use a condom or other form of protection in order to prevent STIs. Take aspirin only as told by your  health care provider. Make sure that you understand how much to take and what form to take. Work with your health care provider to find out whether it  is safe and beneficial for you to take aspirin daily. Ask your health care provider if you need to take a cholesterol-lowering medicine (statin). Find healthy ways to manage stress, such as: Meditation, yoga, or listening to music. Journaling. Talking to a trusted person. Spending time with friends and family. Minimize exposure to UV radiation to reduce your risk of skin cancer. Safety Always wear your seat belt while driving or riding in a vehicle. Do not drive: If you have been drinking alcohol. Do not ride with someone who has been drinking. When you are tired or distracted. While texting. If you have been using any mind-altering substances or drugs. Wear a helmet and other protective equipment during sports activities. If you have firearms in your house, make sure you follow all gun safety procedures. What's next? Visit your health care provider once a year for an annual wellness visit. Ask your health care provider how often you should have your eyes and teeth checked. Stay up to date on all vaccines. This information is not intended to replace advice given to you by your health care provider. Make sure you discuss any questions you have with your health care provider. Document Revised: 02/25/2021 Document Reviewed: 02/25/2021 Elsevier Patient Education  [place].

## 2021-08-17 NOTE — Progress Notes (Signed)
Subjective:   Cindy Rogers is a 70 y.o. female who presents for Medicare Annual (Subsequent) preventive examination.  Review of Systems    General:   No F/C, wt loss Pulm:   No DIB, SOB, pleuritic chest pain Card:  No CP, palpitations Abd:  No n/v/d or pain Ext:  No edema     Objective:    Today's Vitals   08/17/21 0948  BP: (!) 151/79  Pulse: 72  Temp: 97.9 F (36.6 C)  SpO2: 98%  Weight: 220 lb (99.8 kg)  Height: 5\' 10"  (1.778 m)   Body mass index is 31.57 kg/m.  Advanced Directives 09/30/2018 01/26/2017 12/10/2011  Does Patient Have a Medical Advance Directive? No Yes Patient has advance directive, copy not in chart  Type of Advance Directive - - Living will  Would patient like information on creating a medical advance directive? No - Patient declined - -    Current Medications (verified) Outpatient Encounter Medications as of 08/17/2021  Medication Sig   albuterol (PROAIR HFA) 108 (90 Base) MCG/ACT inhaler INHALE 2 PUFFS EVERY 4 HOURS AS NEEDED ONLY IF YOU CAN'T CATCH YOUR BREATH.   budesonide-formoterol (SYMBICORT) 80-4.5 MCG/ACT inhaler Inhale 2 puffs into the lungs 2 (two) times daily.   calcium-vitamin D (OSCAL WITH D) 500-200 MG-UNIT per tablet Take 1 tablet by mouth daily.   cholecalciferol (VITAMIN D) 1000 UNITS tablet Take 2,000 Units by mouth daily.    esomeprazole (NEXIUM) 20 MG capsule Take 20 mg by mouth daily at 12 noon.   famotidine (PEPCID) 20 MG tablet Take by mouth one at bedtime   fluticasone (FLONASE) 50 MCG/ACT nasal spray Place 2 sprays into the nose daily as needed. For allergies   guaiFENesin (MUCINEX) 600 MG 12 hr tablet Take by mouth 2 (two) times daily as needed.   ibuprofen (ADVIL,MOTRIN) 200 MG tablet Take 600 mg by mouth every 6 (six) hours as needed. For pain   montelukast (SINGULAIR) 10 MG tablet Take 1 tablet (10 mg total) by mouth at bedtime.   naproxen (NAPROSYN) 500 MG tablet Take 500 mg by mouth as needed.   polyethylene glycol  (MIRALAX / GLYCOLAX) 17 g packet Take 17 g by mouth daily. 2-3 times weekly   rosuvastatin (CRESTOR) 5 MG tablet Take 1 tablet (5 mg total) by mouth once a week.   [DISCONTINUED] montelukast (SINGULAIR) 10 MG tablet Take 1 tablet (10 mg total) by mouth at bedtime.   No facility-administered encounter medications on file as of 08/17/2021.    Allergies (verified) Food, Shellfish allergy, Codeine, Penicillins, Sulfa antibiotics, and Neosporin af [miconazole]   History: Past Medical History:  Diagnosis Date   Asthma    Asthma    Phreesia 07/15/2020   GERD (gastroesophageal reflux disease)    Headache(784.0)    Pneumonia    PONV (postoperative nausea and vomiting)    Shortness of breath    Past Surgical History:  Procedure Laterality Date   ABDOMINAL HYSTERECTOMY     CHOLECYSTECTOMY     RHINOPLASTY     Family History  Problem Relation Age of Onset   Emphysema Father        smoked   Heart disease Father    Kidney cancer Father        with mets to liver and retina   Heart disease Mother    Social History   Socioeconomic History   Marital status: Married    Spouse name: Not on file   Number of children:  Not on file   Years of education: Not on file   Highest education level: Not on file  Occupational History   Occupation: Retired Associate Professor: [employer]  Tobacco Use   Smoking status: Never   Smokeless tobacco: Never  Substance and Sexual Activity   Alcohol use: Yes    Comment: glass of wine approx twice per wk   Drug use: No   Sexual activity: Yes    Birth control/protection: Post-menopausal  Other Topics Concern   Not on file  Social History Narrative   Not on file   Social Determinants of Health   Financial Resource Strain: Not on file  Food Insecurity: Not on file  Transportation Needs: No Transportation Needs   Lack of Transportation (Medical): No   Lack of Transportation (Non-Medical): No  Physical Activity: Not on file  Stress: Not on  file  Social Connections: Not on file    Tobacco Counseling Counseling given: Not Answered    Diabetic? no    Activities of Daily Living In your present state of health, do you have any difficulty performing the following activities: 08/17/2021 02/02/2021  Hearing? N N  Vision? N Y  Difficulty concentrating or making decisions? N N  Walking or climbing stairs? N N  Dressing or bathing? N N  Doing errands, shopping? N N  Some recent data might be hidden    Patient Care Team: Lorrene Reid, PA-C as PCP - General (Physician Assistant) Tanda Rockers, MD as Consulting Physician (Pulmonary Disease) Berle Mull, MD as Consulting Physician (Sports Medicine) Sydnee Levans, MD as Consulting Physician (Dermatology) Darleen Crocker, MD as Consulting Physician (Ophthalmology)  Indicate any recent Medical Services you may have received from other than [facility] providers in the past year (date may be approximate).     Assessment:   This is a routine wellness examination for [place].  Hearing/Vision screen No results found.  Dietary issues and exercise activities discussed: -Recommend to follow a heart healthy diet low in saturated fats (reduce cheese intake) and continue to stay active with golf and moderate aerobic exercise.    Goals Addressed   None   Depression Screen PHQ 2/9 Scores 08/17/2021 02/02/2021 07/16/2020 03/03/2020 01/01/2019 09/11/2018 08/14/2018  PHQ - 2 Score 0 0 0 0 1 0 1  PHQ- 9 Score 0 0 0 2 2 4 4     Fall Risk Fall Risk  08/17/2021 02/02/2021 07/16/2020 01/01/2019 09/11/2018  Falls in the past year? 0 0 0 1 1  Number falls in past yr: 0 0 - 0 1  Injury with Fall? 0 0 - 1 1  Risk for fall due to : No Fall Risks No Fall Risks - - -  Follow up Falls evaluation completed Falls evaluation completed Falls evaluation completed - -    FALL RISK PREVENTION PERTAINING TO THE HOME:  Any stairs in or around the home? Yes  If so, are there any without handrails? No   Home free of loose throw rugs in walkways, pet beds, electrical cords, etc? Yes  Adequate lighting in your home to reduce risk of falls? Yes   ASSISTIVE DEVICES UTILIZED TO PREVENT FALLS:  Life alert? No  Use of a cane, walker or w/c? No  Grab bars in the bathroom? Yes  Shower chair or bench in shower? No  Elevated toilet seat or a handicapped toilet? Yes   TIMED UP AND GO:  Was the test performed? Yes .  Length of time to  ambulate 10 feet: 10 sec.   Gait steady and fast without use of assistive device  Cognitive Function: wnl's    6CIT Screen 08/17/2021 07/16/2020 01/01/2019 03/02/2017  What Year? 0 points 0 points 0 points 0 points  What month? 0 points 0 points 0 points 0 points  What time? 0 points 0 points 0 points 0 points  Count back from 20 0 points 0 points 0 points 0 points  Months in reverse 0 points 0 points 0 points 0 points  Repeat phrase 0 points 0 points 2 points 0 points  Total Score 0 0 2 0    Immunizations Immunization History  Administered Date(s) Administered   Fluad Quad(high Dose 65+) 05/31/2019, 06/18/2021   Influenza Split 06/13/2013, 06/13/2014   Influenza Whole 06/13/2016   Influenza, High Dose Seasonal PF 06/22/2018   Influenza-Unspecified 05/26/2017, 06/23/2020   PFIZER(Purple Top)SARS-COV-2 Vaccination 10/19/2019, 11/13/2019   Pneumococcal Conjugate-13 02/04/2016   Pneumococcal Polysaccharide-23 04/21/2011   Tdap 03/27/2009, 09/11/2018   Zoster Recombinat (Shingrix) 03/08/2017, 05/26/2017   Zoster, Live 08/02/2011    TDAP status: Up to date  Flu Vaccine status: Up to date  Pneumococcal vaccine status: Up to date  Covid-19 vaccine status: Completed vaccines  Qualifies for Shingles Vaccine? Yes   Zostavax completed No   Shingrix Completed?: Yes  Screening Tests Health Maintenance  Topic Date Due   Pneumonia Vaccine 61+ Years old (3 - PPSV23 if available, else PCV20) 02/03/2017   COVID-19 Vaccine (3 - Pfizer risk series)  12/11/2019   MAMMOGRAM  06/30/2023   COLONOSCOPY (Pts 45-16yrs Insurance coverage will need to be confirmed)  06/02/2027   TETANUS/TDAP  09/11/2028   INFLUENZA VACCINE  Completed   DEXA SCAN  Completed   Hepatitis C Screening  Completed   Zoster Vaccines- Shingrix  Completed   HPV VACCINES  Aged Out    Health Maintenance  Health Maintenance Due  Topic Date Due   Pneumonia Vaccine 49+ Years old (3 - PPSV23 if available, else PCV20) 02/03/2017   COVID-19 Vaccine (3 - Pfizer risk series) 12/11/2019    Colorectal cancer screening: Type of screening: Colonoscopy. Completed 05/31/2017. Repeat every 5 years  Mammogram status: Completed 06/29/2021. Repeat every year  Bone Density status: Ordered 08/17/2021. Pt provided with contact info and advised to call to schedule appt.  Lung Cancer Screening: (Low Dose CT Chest recommended if Age 60-80 years, 30 pack-year currently smoking OR have quit w/in 15years.) does not qualify.   Lung Cancer Screening Referral: n/a  Additional Screening:  Hepatitis C Screening: does qualify; Completed 03/02/17  Vision Screening: Recommended annual ophthalmology exams for early detection of glaucoma and other disorders of the eye. Is the patient up to date with their annual eye exam?  Yes  Who is the provider or what is the name of the office in which the patient attends annual eye exams? Dr. Luretha Rued If pt is not established with a provider, would they like to be referred to a provider to establish care? Marland Kitchen  No  Dental Screening: Recommended annual dental exams for proper oral hygiene  Community Resource Referral / Chronic Care Management: CRR required this visit?  No   CCM required this visit?  No      Plan:  -Discussed most recent lab results which are essentially within normal limits or stable from prior with the exception of LDL which has mildly increased. Will repeat lipid panel in 6 months, if LDL remains elevated then recommend to consider  increasing rosuvastatin. -  Continue current medication regimen. -Follow up in 6 months for HLD, asthma, and FBW (lipid panel, cmp, cbc w/d, vit d)  I have personally reviewed and noted the following in the patient's chart:   Medical and social history Use of alcohol, tobacco or illicit drugs  Current medications and supplements including opioid prescriptions.  Functional ability and status Nutritional status Physical activity Advanced directives List of other physicians Hospitalizations, surgeries, and ER visits in previous 12 months Vitals Screenings to include cognitive, depression, and falls Referrals and appointments  In addition, I have reviewed and discussed with patient certain preventive protocols, quality metrics, and best practice recommendations. A written personalized care plan for preventive services as well as general preventive health recommendations were provided to patient.     Lorrene Reid, PA-C   08/17/2021

## 2021-08-19 ENCOUNTER — Other Ambulatory Visit: Payer: Self-pay

## 2021-08-19 ENCOUNTER — Encounter: Payer: Self-pay | Admitting: Nurse Practitioner

## 2021-08-19 ENCOUNTER — Ambulatory Visit (INDEPENDENT_AMBULATORY_CARE_PROVIDER_SITE_OTHER): Payer: PPO | Admitting: Nurse Practitioner

## 2021-08-19 VITALS — BP 124/76 | HR 70 | Temp 98.5°F | Ht 70.0 in | Wt 220.4 lb

## 2021-08-19 DIAGNOSIS — K047 Periapical abscess without sinus: Secondary | ICD-10-CM | POA: Diagnosis not present

## 2021-08-19 DIAGNOSIS — R6884 Jaw pain: Secondary | ICD-10-CM | POA: Insufficient documentation

## 2021-08-19 MED ORDER — KETOROLAC TROMETHAMINE 60 MG/2ML IM SOLN
60.0000 mg | Freq: Once | INTRAMUSCULAR | Status: AC
  Administered 2021-08-19: 60 mg via INTRAMUSCULAR

## 2021-08-19 MED ORDER — DOXYCYCLINE HYCLATE 100 MG PO TABS: 100.0000 mg | ORAL_TABLET | Freq: Two times a day (BID) | ORAL | 0 refills | Status: DC

## 2021-08-19 NOTE — Progress Notes (Signed)
Acute Office Visit  Subjective:    Patient ID: Cindy Rogers, female    DOB: 08-19-1951, 70 y.o.   MRN: [id number]  Chief Complaint  Patient presents with   Jaw Pain    HPI Patient is in today for right sided jaw pain. Started suddenly on Monday. Started on lower right side of the mouth and has gradually moved upward. She does have some pain around the right ear and the entire right side of her face.  She denies fever.  Does have headache.  She denies sore throat or difficulty swallowing.  States that she was unable to sleep last night as pain has been so significant.  Has been alternating Tylenol and ibuprofen without relief of symptoms.  Unsure if symptoms are dental or sinus.  Past Medical History:  Diagnosis Date   Asthma    Asthma    Phreesia 07/15/2020   GERD (gastroesophageal reflux disease)    Headache(784.0)    Pneumonia    PONV (postoperative nausea and vomiting)    Shortness of breath     Past Surgical History:  Procedure Laterality Date   ABDOMINAL HYSTERECTOMY     CHOLECYSTECTOMY     RHINOPLASTY      Family History  Problem Relation Age of Onset   Emphysema Father        smoked   Heart disease Father    Kidney cancer Father        with mets to liver and retina   Heart disease Mother     Social History   Socioeconomic History   Marital status: Married    Spouse name: Not on file   Number of children: Not on file   Years of education: Not on file   Highest education level: Not on file  Occupational History   Occupation: Retired Associate Professor: [employer]  Tobacco Use   Smoking status: Never   Smokeless tobacco: Never  Substance and Sexual Activity   Alcohol use: Yes    Comment: glass of wine approx twice per wk   Drug use: No   Sexual activity: Yes    Birth control/protection: Post-menopausal  Other Topics Concern   Not on file  Social History Narrative   Not on file   Social Determinants of Health   Financial Resource  Strain: Not on file  Food Insecurity: Not on file  Transportation Needs: No Transportation Needs   Lack of Transportation (Medical): No   Lack of Transportation (Non-Medical): No  Physical Activity: Not on file  Stress: Not on file  Social Connections: Not on file  Intimate Partner Violence: Not on file    Outpatient Medications Prior to Visit  Medication Sig Dispense Refill   albuterol (PROAIR HFA) 108 (90 Base) MCG/ACT inhaler INHALE 2 PUFFS EVERY 4 HOURS AS NEEDED ONLY IF YOU CAN'T CATCH YOUR BREATH. 8.5 g 1   budesonide-formoterol (SYMBICORT) 80-4.5 MCG/ACT inhaler Inhale 2 puffs into the lungs 2 (two) times daily. 6.9 g 3   calcium-vitamin D (OSCAL WITH D) 500-200 MG-UNIT per tablet Take 1 tablet by mouth daily.     cholecalciferol (VITAMIN D) 1000 UNITS tablet Take 2,000 Units by mouth daily.      esomeprazole (NEXIUM) 20 MG capsule Take 20 mg by mouth daily at 12 noon.     famotidine (PEPCID) 20 MG tablet Take by mouth one at bedtime     fluticasone (FLONASE) 50 MCG/ACT nasal spray Place 2 sprays into the nose  daily as needed. For allergies     guaiFENesin (MUCINEX) 600 MG 12 hr tablet Take by mouth 2 (two) times daily as needed.     ibuprofen (ADVIL,MOTRIN) 200 MG tablet Take 600 mg by mouth every 6 (six) hours as needed. For pain     montelukast (SINGULAIR) 10 MG tablet Take 1 tablet (10 mg total) by mouth at bedtime. 30 tablet 0   naproxen (NAPROSYN) 500 MG tablet Take 500 mg by mouth as needed.     polyethylene glycol (MIRALAX / GLYCOLAX) 17 g packet Take 17 g by mouth daily. 2-3 times weekly     rosuvastatin (CRESTOR) 5 MG tablet Take 1 tablet (5 mg total) by mouth once a week. 90 tablet 1   No facility-administered medications prior to visit.    Allergies  Allergen Reactions   Food Anaphylaxis    "tree nuts"   Shellfish Allergy Shortness Of Breath   Codeine     "wild dreams"   Penicillins     "ashmatic" 09-30-2018 pt has taken in the past year and had no problems  was told not to take since asthmatic.    Sulfa Antibiotics Nausea And Vomiting   Neosporin Af [Miconazole]     Rash    Review of Systems  Constitutional:  Positive for appetite change and fatigue. Negative for activity change, chills and fever.       Unable to eat because his jaw pain is so significant on the right side.  Very tired as she was unable to sleep last night due to pain.  HENT:  Positive for dental problem, ear pain, facial swelling and sinus pressure. Negative for congestion, postnasal drip, rhinorrhea, sinus pain, sneezing and sore throat.        Jaw pain.  Eyes: Negative.   Respiratory:  Negative for cough, chest tightness, shortness of breath and wheezing.   Cardiovascular:  Negative for chest pain and palpitations.  Gastrointestinal:  Negative for abdominal pain, constipation, diarrhea, nausea and vomiting.  Endocrine: Negative for cold intolerance, heat intolerance, polydipsia and polyuria.  Genitourinary:  Negative for dyspareunia, dysuria, flank pain, frequency and urgency.  Musculoskeletal:  Positive for myalgias. Negative for arthralgias and back pain.  Skin:  Negative for rash.  Allergic/Immunologic: Negative for environmental allergies.  Neurological:  Negative for dizziness, weakness and headaches.  Hematological:  Negative for adenopathy.  Psychiatric/Behavioral:  The patient is not nervous/anxious.       Objective:    Physical Exam Vitals and nursing note reviewed.  Constitutional:      General: She is in acute distress.     Appearance: Normal appearance. She is well-developed.  HENT:     Head: Normocephalic.     Jaw: Tenderness, swelling and pain on movement present.     Right Ear: Hearing, ear canal and external ear normal. Tympanic membrane is bulging.     Left Ear: Hearing, tympanic membrane, ear canal and external ear normal.     Mouth/Throat:     Dentition: Dental tenderness and gingival swelling present.     Pharynx: Oropharynx is clear. Uvula  midline.      Comments: Tenderness and swelling of the gums on the right side of the mouth both upper and lower jaw.  No specific lesion or abscess noted at this time. Eyes:     Pupils: Pupils are equal, round, and reactive to light.  Cardiovascular:     Rate and Rhythm: Normal rate and regular rhythm.     Pulses: Normal  pulses.     Heart sounds: Normal heart sounds.  Pulmonary:     Effort: Pulmonary effort is normal.     Breath sounds: Normal breath sounds.  Abdominal:     Palpations: Abdomen is soft.  Musculoskeletal:        General: Normal range of motion.     Cervical back: Normal range of motion and neck supple.  Lymphadenopathy:     Cervical: No cervical adenopathy.  Skin:    General: Skin is warm and dry.     Capillary Refill: Capillary refill takes less than 2 seconds.  Neurological:     General: No focal deficit present.     Mental Status: She is alert and oriented to person, place, and time.  Psychiatric:        Mood and Affect: Mood normal.        Behavior: Behavior normal.        Thought Content: Thought content normal.        Judgment: Judgment normal.    Today's Vitals   08/19/21 1104  BP: 124/76  Pulse: 70  Temp: 98.5 F (36.9 C)  SpO2: 98%  Weight: 220 lb 6.4 oz (100 kg)  Height: $Remove'5\' 10"'[id number]$  (1.778 m)   Body mass index is 31.62 kg/m.   Wt Readings from Last 3 Encounters:  08/19/21 220 lb 6.4 oz (100 kg)  08/17/21 220 lb (99.8 kg)  02/02/21 210 lb 3.2 oz (95.3 kg)    Health Maintenance Due  Topic Date Due   Pneumonia Vaccine 15+ Years old (54 - PPSV23 if available, else PCV20) 02/03/2017   COVID-19 Vaccine (3 - Pfizer risk series) 12/11/2019    There are no preventive care reminders to display for this patient.   Lab Results  Component Value Date   TSH 3.540 08/14/2021   Lab Results  Component Value Date   WBC 4.4 08/14/2021   HGB 12.3 08/14/2021   HCT 37.1 08/14/2021   MCV 86 08/14/2021   PLT 198 08/14/2021   Lab Results  Component  Value Date   NA 142 08/14/2021   K 4.4 08/14/2021   CO2 24 08/14/2021   GLUCOSE 87 08/14/2021   BUN 14 08/14/2021   CREATININE 0.91 08/14/2021   BILITOT 0.4 08/14/2021   ALKPHOS 63 08/14/2021   AST 16 08/14/2021   ALT 13 08/14/2021   PROT 6.5 08/14/2021   ALBUMIN 4.3 08/14/2021   CALCIUM 10.1 08/14/2021   EGFR 68 08/14/2021   Lab Results  Component Value Date   CHOL 181 08/14/2021   Lab Results  Component Value Date   HDL 65 08/14/2021   Lab Results  Component Value Date   LDLCALC 105 (H) 08/14/2021   Lab Results  Component Value Date   TRIG 54 08/14/2021   Lab Results  Component Value Date   CHOLHDL 2.8 08/14/2021   Lab Results  Component Value Date   HGBA1C 5.6 08/14/2021       Assessment & Plan:  1. Dental infection Start doxycycline 100 mg tablets twice daily for next 7 days.  Recommend she contact her dentist for further evaluation. - doxycycline (VIBRA-TABS) 100 MG tablet; Take 1 tablet (100 mg total) by mouth 2 (two) times daily.  Dispense: 14 tablet; Refill: 0  2. Jaw pain Injection Toradol 60 mg IM given during today's visit.  Patient tolerated well.  Recommend warm compress to the right jaw and cheek.  May continue to take Tylenol as needed and as indicated for pain  today.  Tomorrow, she may begin to alternate Tylenol and ibuprofen as needed to help with pain control.  Again, recommend she contact her dentist for further evaluation. - ketorolac (TORADOL) injection 60 mg   Problem List Items Addressed This Visit       Digestive   Dental infection - Primary   Relevant Medications   doxycycline (VIBRA-TABS) 100 MG tablet     Other   Jaw pain     Meds ordered this encounter  Medications   doxycycline (VIBRA-TABS) 100 MG tablet    Sig: Take 1 tablet (100 mg total) by mouth 2 (two) times daily.    Dispense:  14 tablet    Refill:  0    Order Specific Question:   Supervising Provider    Answer:   Beatrice Lecher D [[id number]]   ketorolac  (TORADOL) injection 60 mg      Ronnell Freshwater, NP  This note was dictated using Systems analyst. Rapid proofreading was performed to expedite the delivery of the information. Despite proofreading, phonetic errors will occur which are common with this voice recognition software. Please take this into consideration. If there are any concerns, please contact our office.

## 2021-09-30 ENCOUNTER — Other Ambulatory Visit: Payer: Self-pay

## 2021-09-30 ENCOUNTER — Encounter: Payer: Self-pay | Admitting: Physician Assistant

## 2021-09-30 DIAGNOSIS — J45991 Cough variant asthma: Secondary | ICD-10-CM

## 2021-09-30 DIAGNOSIS — J45909 Unspecified asthma, uncomplicated: Secondary | ICD-10-CM

## 2021-09-30 MED ORDER — BUDESONIDE-FORMOTEROL FUMARATE 80-4.5 MCG/ACT IN AERO: 2.0000 | INHALATION_SPRAY | Freq: Two times a day (BID) | RESPIRATORY_TRACT | 3 refills | Status: DC

## 2021-09-30 MED ORDER — MONTELUKAST SODIUM 10 MG PO TABS: 10.0000 mg | ORAL_TABLET | Freq: Every day | ORAL | 0 refills | Status: DC

## 2021-10-08 ENCOUNTER — Other Ambulatory Visit (HOSPITAL_COMMUNITY): Payer: Self-pay

## 2021-10-08 ENCOUNTER — Other Ambulatory Visit: Payer: Self-pay | Admitting: Physician Assistant

## 2021-10-08 DIAGNOSIS — L821 Other seborrheic keratosis: Secondary | ICD-10-CM | POA: Diagnosis not present

## 2021-10-08 DIAGNOSIS — L820 Inflamed seborrheic keratosis: Secondary | ICD-10-CM | POA: Diagnosis not present

## 2021-10-08 DIAGNOSIS — D2272 Melanocytic nevi of left lower limb, including hip: Secondary | ICD-10-CM | POA: Diagnosis not present

## 2021-10-08 DIAGNOSIS — D2261 Melanocytic nevi of right upper limb, including shoulder: Secondary | ICD-10-CM | POA: Diagnosis not present

## 2021-10-08 DIAGNOSIS — D1801 Hemangioma of skin and subcutaneous tissue: Secondary | ICD-10-CM | POA: Diagnosis not present

## 2021-10-08 DIAGNOSIS — J45909 Unspecified asthma, uncomplicated: Secondary | ICD-10-CM

## 2021-10-08 DIAGNOSIS — D225 Melanocytic nevi of trunk: Secondary | ICD-10-CM | POA: Diagnosis not present

## 2021-10-08 DIAGNOSIS — D2271 Melanocytic nevi of right lower limb, including hip: Secondary | ICD-10-CM | POA: Diagnosis not present

## 2021-10-08 DIAGNOSIS — D220 Melanocytic nevi of lip: Secondary | ICD-10-CM | POA: Diagnosis not present

## 2021-10-08 DIAGNOSIS — L720 Epidermal cyst: Secondary | ICD-10-CM | POA: Diagnosis not present

## 2021-10-08 DIAGNOSIS — L814 Other melanin hyperpigmentation: Secondary | ICD-10-CM | POA: Diagnosis not present

## 2021-10-08 DIAGNOSIS — D22121 Melanocytic nevi of left upper eyelid, including canthus: Secondary | ICD-10-CM | POA: Diagnosis not present

## 2021-10-08 DIAGNOSIS — D2371 Other benign neoplasm of skin of right lower limb, including hip: Secondary | ICD-10-CM | POA: Diagnosis not present

## 2021-10-08 DIAGNOSIS — J45991 Cough variant asthma: Secondary | ICD-10-CM

## 2021-10-08 MED ORDER — BUDESONIDE-FORMOTEROL FUMARATE 80-4.5 MCG/ACT IN AERO
2.0000 | INHALATION_SPRAY | Freq: Two times a day (BID) | RESPIRATORY_TRACT | 3 refills | Status: DC
  Filled 2021-10-08: qty 10.2, 30d supply, fill #0
  Filled 2021-11-26: qty 10.2, 30d supply, fill #1
  Filled 2022-01-08: qty 10.2, 30d supply, fill #2

## 2021-10-14 ENCOUNTER — Other Ambulatory Visit (HOSPITAL_COMMUNITY): Payer: Self-pay

## 2021-10-15 ENCOUNTER — Other Ambulatory Visit (HOSPITAL_COMMUNITY): Payer: Self-pay

## 2021-10-24 ENCOUNTER — Other Ambulatory Visit (HOSPITAL_COMMUNITY): Payer: Self-pay

## 2021-10-27 ENCOUNTER — Other Ambulatory Visit (HOSPITAL_COMMUNITY): Payer: Self-pay

## 2021-11-26 ENCOUNTER — Other Ambulatory Visit (HOSPITAL_COMMUNITY): Payer: Self-pay

## 2021-12-03 ENCOUNTER — Other Ambulatory Visit: Payer: Self-pay

## 2021-12-03 DIAGNOSIS — J45909 Unspecified asthma, uncomplicated: Secondary | ICD-10-CM

## 2021-12-03 DIAGNOSIS — J45991 Cough variant asthma: Secondary | ICD-10-CM

## 2021-12-03 MED ORDER — MONTELUKAST SODIUM 10 MG PO TABS: 10.0000 mg | ORAL_TABLET | Freq: Every day | ORAL | 1 refills | Status: DC

## 2022-01-08 ENCOUNTER — Other Ambulatory Visit: Payer: Self-pay | Admitting: Physician Assistant

## 2022-01-08 ENCOUNTER — Other Ambulatory Visit (HOSPITAL_COMMUNITY): Payer: Self-pay

## 2022-01-08 DIAGNOSIS — J45909 Unspecified asthma, uncomplicated: Secondary | ICD-10-CM

## 2022-01-08 DIAGNOSIS — J45991 Cough variant asthma: Secondary | ICD-10-CM

## 2022-01-08 MED ORDER — BUDESONIDE-FORMOTEROL FUMARATE 80-4.5 MCG/ACT IN AERO
2.0000 | INHALATION_SPRAY | Freq: Two times a day (BID) | RESPIRATORY_TRACT | 3 refills | Status: DC
  Filled 2022-01-08: qty 10.2, 30d supply, fill #0

## 2022-02-01 ENCOUNTER — Encounter: Payer: Self-pay | Admitting: Physician Assistant

## 2022-02-01 ENCOUNTER — Ambulatory Visit (INDEPENDENT_AMBULATORY_CARE_PROVIDER_SITE_OTHER): Payer: PPO | Admitting: Physician Assistant

## 2022-02-01 ENCOUNTER — Other Ambulatory Visit (HOSPITAL_COMMUNITY): Payer: Self-pay

## 2022-02-01 VITALS — BP 157/82 | HR 69 | Temp 97.7°F | Ht 70.0 in | Wt 208.0 lb

## 2022-02-01 DIAGNOSIS — B9689 Other specified bacterial agents as the cause of diseases classified elsewhere: Secondary | ICD-10-CM | POA: Diagnosis not present

## 2022-02-01 DIAGNOSIS — J45991 Cough variant asthma: Secondary | ICD-10-CM

## 2022-02-01 DIAGNOSIS — J988 Other specified respiratory disorders: Secondary | ICD-10-CM | POA: Diagnosis not present

## 2022-02-01 DIAGNOSIS — E7800 Pure hypercholesterolemia, unspecified: Secondary | ICD-10-CM | POA: Diagnosis not present

## 2022-02-01 DIAGNOSIS — J45909 Unspecified asthma, uncomplicated: Secondary | ICD-10-CM

## 2022-02-01 MED ORDER — BUDESONIDE-FORMOTEROL FUMARATE 80-4.5 MCG/ACT IN AERO
2.0000 | INHALATION_SPRAY | Freq: Two times a day (BID) | RESPIRATORY_TRACT | 2 refills | Status: DC
  Filled 2022-02-01: qty 10.2, 30d supply, fill #0
  Filled 2022-03-12: qty 10.2, 30d supply, fill #1
  Filled 2022-04-06: qty 10.2, 30d supply, fill #2

## 2022-02-01 MED ORDER — PREDNISONE 20 MG PO TABS: 20.0000 mg | ORAL_TABLET | Freq: Every day | ORAL | 0 refills | Status: AC

## 2022-02-01 MED ORDER — AZITHROMYCIN 250 MG PO TABS: ORAL_TABLET | ORAL | 0 refills | Status: AC

## 2022-02-01 MED ORDER — BENZONATATE 200 MG PO CAPS: 200.0000 mg | ORAL_CAPSULE | Freq: Three times a day (TID) | ORAL | 0 refills | Status: DC | PRN

## 2022-02-01 NOTE — Patient Instructions (Signed)

## 2022-02-01 NOTE — Progress Notes (Signed)
Established patient acute visit   Patient: Cindy Rogers   DOB: [birth year]   71 y.o. Female  MRN: [id number] Visit Date: 02/01/2022  Chief Complaint  Patient presents with   Cough   Subjective    HPI  Patient presents with c/o productive cough with green-yellow and grey sputum x 2 weeks. This morning had chest tightness. Patient reports symptoms started with sinus pressure and nasal drip which she continues to have. No earache but does have bilateral ear pressure. Had a scratchy throat which has resolved. No fever, cold chills, night sweats. Has tired Mucinex and cough syrup with minimal relief. Did a Covid test about 2 weeks ago which was negative.   Medications: Outpatient Medications Prior to Visit  Medication Sig   albuterol (PROAIR HFA) 108 (90 Base) MCG/ACT inhaler INHALE 2 PUFFS EVERY 4 HOURS AS NEEDED ONLY IF YOU CAN'T CATCH YOUR BREATH.   calcium-vitamin D (OSCAL WITH D) 500-200 MG-UNIT per tablet Take 1 tablet by mouth daily.   cholecalciferol (VITAMIN D) 1000 UNITS tablet Take 2,000 Units by mouth daily.    doxycycline (VIBRA-TABS) 100 MG tablet Take 1 tablet (100 mg total) by mouth 2 (two) times daily.   esomeprazole (NEXIUM) 20 MG capsule Take 20 mg by mouth daily at 12 noon.   famotidine (PEPCID) 20 MG tablet Take by mouth one at bedtime   fluticasone (FLONASE) 50 MCG/ACT nasal spray Place 2 sprays into the nose daily as needed. For allergies   guaiFENesin (MUCINEX) 600 MG 12 hr tablet Take by mouth 2 (two) times daily as needed.   ibuprofen (ADVIL,MOTRIN) 200 MG tablet Take 600 mg by mouth every 6 (six) hours as needed. For pain   montelukast (SINGULAIR) 10 MG tablet Take 1 tablet (10 mg total) by mouth at bedtime.   naproxen (NAPROSYN) 500 MG tablet Take 500 mg by mouth as needed.   polyethylene glycol (MIRALAX / GLYCOLAX) 17 g packet Take 17 g by mouth daily. 2-3 times weekly   rosuvastatin (CRESTOR) 5 MG tablet Take 1 tablet (5 mg total) by mouth once a week.    [DISCONTINUED] budesonide-formoterol (SYMBICORT) 80-4.5 MCG/ACT inhaler Inhale 2 puffs into the lungs 2 (two) times daily.   No facility-administered medications prior to visit.    Review of Systems Review of Systems:  A fourteen system review of systems was performed and found to be positive as per HPI.  Last CBC Lab Results  Component Value Date   WBC 4.4 08/14/2021   HGB 12.3 08/14/2021   HCT 37.1 08/14/2021   MCV 86 08/14/2021   MCH 28.3 08/14/2021   RDW 15.7 (H) 08/14/2021   PLT 198 59/93/5701   Last metabolic panel Lab Results  Component Value Date   GLUCOSE 87 08/14/2021   NA 142 08/14/2021   K 4.4 08/14/2021   CL 106 08/14/2021   CO2 24 08/14/2021   BUN 14 08/14/2021   CREATININE 0.91 08/14/2021   EGFR 68 08/14/2021   CALCIUM 10.1 08/14/2021   PROT 6.5 08/14/2021   ALBUMIN 4.3 08/14/2021   LABGLOB 2.2 08/14/2021   AGRATIO 2.0 08/14/2021   BILITOT 0.4 08/14/2021   ALKPHOS 63 08/14/2021   AST 16 08/14/2021   ALT 13 08/14/2021   Last lipids Lab Results  Component Value Date   CHOL 181 08/14/2021   HDL 65 08/14/2021   LDLCALC 105 (H) 08/14/2021   TRIG 54 08/14/2021   CHOLHDL 2.8 08/14/2021   Last hemoglobin A1c Lab Results  Component Value Date  HGBA1C 5.6 08/14/2021   Last thyroid functions Lab Results  Component Value Date   TSH 3.540 08/14/2021   T3TOTAL 105 08/29/2018   Last vitamin D Lab Results  Component Value Date   VD25OH 40.2 07/16/2020     Objective    There were no vitals taken for this visit.   Physical Exam  General:  Well Developed, well nourished, appropriate for stated age.  Neuro:  Alert and oriented,  extra-ocular muscles intact  HEENT:  Normocephalic, atraumatic, PERRL, frontal sinus pressure, no maxillary sinus tenderness, fluid behind both TM's, pale nasal mucosa, mildly boggy turbinates, erythema of posterior oropharynx, neck supple, no adenopathy   Skin:  no gross rash, warm, pink. Cardiac:  RRR, S1  S2 Respiratory: Scattered rhonchi with wheezing, no crackles or rales.  Vascular:  Ext warm, no cyanosis apprec.; cap RF less 2 sec. Psych:  No HI/SI, judgement and insight good, Euthymic mood. Full Affect.   No results found for any visits on 02/01/22.  Assessment & Plan     Patient has s/sx suggestive of bacterial respiratory infection which has been on going for >10 days so will oral antibiotic therapy and corticosteroid. Recommend to continue home supportive care. Recommend to take tessalon Perles as needed for cough. Will collect labs for upcoming follow-up visit.    Return for as scheduled .        Lorrene Reid, PA-C  [facility] at [facility] [phone] (phone) [phone] (fax)  [place]

## 2022-02-02 LAB — COMPREHENSIVE METABOLIC PANEL
ALT: 14 IU/L (ref 0–32)
AST: 15 IU/L (ref 0–40)
Albumin/Globulin Ratio: 1.9 (ref 1.2–2.2)
Albumin: 4.3 g/dL (ref 3.7–4.7)
Alkaline Phosphatase: 62 IU/L (ref 44–121)
BUN/Creatinine Ratio: 14 (ref 12–28)
BUN: 12 mg/dL (ref 8–27)
Bilirubin Total: 0.4 mg/dL (ref 0.0–1.2)
CO2: 23 mmol/L (ref 20–29)
Calcium: 10.3 mg/dL (ref 8.7–10.3)
Chloride: 110 mmol/L — ABNORMAL HIGH (ref 96–106)
Creatinine, Ser: 0.86 mg/dL (ref 0.57–1.00)
Globulin, Total: 2.3 g/dL (ref 1.5–4.5)
Glucose: 84 mg/dL (ref 70–99)
Potassium: 4.4 mmol/L (ref 3.5–5.2)
Sodium: 145 mmol/L — ABNORMAL HIGH (ref 134–144)
Total Protein: 6.6 g/dL (ref 6.0–8.5)
eGFR: 72 mL/min/{1.73_m2} (ref >59)

## 2022-02-02 LAB — CBC WITH DIFFERENTIAL/PLATELET
Basophils Absolute: 0.1 10*3/uL (ref 0.0–0.2)
Basos: 1 %
EOS (ABSOLUTE): 0.2 10*3/uL (ref 0.0–0.4)
Eos: 4 %
Hematocrit: 37 % (ref 34.0–46.6)
Hemoglobin: 12.1 g/dL (ref 11.1–15.9)
Immature Grans (Abs): 0 10*3/uL (ref 0.0–0.1)
Immature Granulocytes: 0 %
Lymphocytes Absolute: 1.1 10*3/uL (ref 0.7–3.1)
Lymphs: 22 %
MCH: 29 pg (ref 26.6–33.0)
MCHC: 32.7 g/dL (ref 31.5–35.7)
MCV: 89 fL (ref 79–97)
Monocytes Absolute: 0.5 10*3/uL (ref 0.1–0.9)
Monocytes: 9 %
Neutrophils Absolute: 3.4 10*3/uL (ref 1.4–7.0)
Neutrophils: 64 %
Platelets: 239 10*3/uL (ref 150–450)
RBC: 4.17 x10E6/uL (ref 3.77–5.28)
RDW: 13.3 % (ref 11.7–15.4)
WBC: 5.3 10*3/uL (ref 3.4–10.8)

## 2022-02-02 LAB — LDL CHOLESTEROL, DIRECT: LDL Direct: 117 mg/dL — ABNORMAL HIGH (ref 0–99)

## 2022-02-09 ENCOUNTER — Other Ambulatory Visit: Payer: PPO

## 2022-02-15 ENCOUNTER — Encounter: Payer: Self-pay | Admitting: Physician Assistant

## 2022-02-15 ENCOUNTER — Ambulatory Visit (INDEPENDENT_AMBULATORY_CARE_PROVIDER_SITE_OTHER): Payer: PPO | Admitting: Physician Assistant

## 2022-02-15 VITALS — BP 137/71 | HR 70 | Temp 97.7°F | Ht 70.0 in | Wt 202.0 lb

## 2022-02-15 DIAGNOSIS — E7800 Pure hypercholesterolemia, unspecified: Secondary | ICD-10-CM

## 2022-02-15 DIAGNOSIS — J302 Other seasonal allergic rhinitis: Secondary | ICD-10-CM | POA: Diagnosis not present

## 2022-02-15 DIAGNOSIS — J45909 Unspecified asthma, uncomplicated: Secondary | ICD-10-CM

## 2022-02-15 DIAGNOSIS — J45991 Cough variant asthma: Secondary | ICD-10-CM

## 2022-02-15 MED ORDER — ROSUVASTATIN CALCIUM 5 MG PO TABS: ORAL_TABLET | ORAL | 1 refills | Status: DC

## 2022-02-15 NOTE — Assessment & Plan Note (Addendum)
-  Discussed recent direct LDL which is elevated at 117. Patient is agreeable to increase Crestor 5 mg to twice a week. Will repeat lipid panel and hepatic function in 6 weeks. Discussed recent CMP, liver and kidney function normal, recommend to stay well hydrated. CBC is normal.  -Recommend to continue with diet changes including low fat intake and continue to stay active. -Will continue to monitor.

## 2022-02-15 NOTE — Assessment & Plan Note (Signed)
-  Both lungs clear on exam. -Discussed with patient recent refill for Symbicort was for 10.2 g inhaler which should last for more than 30 days, patient reports she did not receive that. Advised to bring her inhaler at lab visit to verify inhaler size.  -Recommend to continue with montelukast.

## 2022-02-15 NOTE — Patient Instructions (Signed)
Allergies, Adult An allergy means that your body reacts to something that bothers it (allergen). This can happen from something that you eat, breathe in, or touch. Allergies often affect the nose, eyes, skin, and stomach. They can be mild, moderate, or very bad (severe). An allergy cannot spread from person to person. They can happen at any age. Sometimes, people outgrow them. What are the causes? Outdoor things, such as pollen, car fumes, and mold. Indoor things, such as dust, smoke, mold, and pets. Foods. Medicines. Things that bother your skin, such as perfume and bug bites. What increases the risk? Having family members with allergies or asthma. What are the signs or symptoms? Symptoms depend on how bad your allergy is. Mild to moderate symptoms Runny nose, stuffy nose, or sneezing. Itchy mouth, ears, or throat. A feeling of mucus dripping down the back of your throat. Sore throat. Eyes that are itchy, red, watery, or puffy. A skin rash, or red, swollen areas of skin (hives). Stomach cramps or bloating. Severe symptoms Very bad allergies to food, medicine, or bug bites may cause a very bad allergy reaction (anaphylaxis). This can be life-threatening. Symptoms include: A red face. Wheezing or coughing. Swollen lips, tongue, or mouth. Tight or swollen throat. Chest pain or tightness, or a fast heartbeat. Trouble breathing or shortness of breath. Pain in your belly (abdomen), vomiting, or watery poop (diarrhea). Feeling dizzy or fainting. How is this treated?     Treatment for this condition depends on your symptoms. Treatment may include: Cold, wet cloths for itching and swelling. Eye drops, nose sprays, or skin creams. Washing out your nose each day. A humidifier. Medicines. A change to the foods you eat. Being exposed again and again to tiny amounts of allergens. This helps your body get used to them. You might have: Allergy shots. Very small amounts of allergen put  under your tongue. An emergency shot (auto-injector pen) if you have a very bad allergy reaction. This is a medicine with a needle. You can put it into your skin by yourself. Your doctor will teach you how to use it. Follow these instructions at home: Medicines  Take or apply over-the-counter and prescription medicines only as told by your doctor. If you are at risk for a very bad allergy reaction, keep an auto-injector pen with you all the time. Eating and drinking Follow instructions from your doctor about what to eat and drink. Drink enough fluid to keep your pee (urine) pale yellow. General instructions If you have ever had a very bad allergy reaction, wear a medical alert bracelet or necklace. Stay away from things that you are allergic to. Keep all follow-up visits as told by your doctor. This is important. Contact a doctor if: Your symptoms do not get better with treatment. Get help right away if: You have symptoms of a very bad allergy reaction. These include: A swollen mouth, tongue, or throat. Pain or tightness in your chest. Trouble breathing. Being short of breath. Dizziness. Fainting. Very bad pain in your belly. Vomiting. Watery poop. These symptoms may be an emergency. Do not wait to see if the symptoms will go away. Get medical help right away. Call your local emergency services (911 in the [place] Do not drive yourself to the hospital. Summary Take or apply over-the-counter and prescription medicines only as told by your doctor. Stay away from things you are allergic to. If you are at risk for a very bad allergy reaction, carry an auto-injector pen all the time.   Wear a medical alert bracelet or necklace. Very bad allergy reactions can be life-threatening. Get help right away. This information is not intended to replace advice given to you by your health care provider. Make sure you discuss any questions you have with your health care provider. Document Revised:  07/11/2019 Document Reviewed: 07/11/2019 Elsevier Patient Education  2023 Elsevier Inc.  

## 2022-02-15 NOTE — Progress Notes (Signed)
Established patient visit   Patient: Cindy Rogers   DOB: Oct 11, 1950   71 y.o. Female  MRN: [id number] Visit Date: 02/15/2022  Chief Complaint  Patient presents with   Follow-up   Subjective    HPI  Patient presents for chronic follow-up. Patient reports was working in the yard this past weekend and since then has developed congestion which is mostly in her throat, postnasal drainage and her eyes are itchy today. Has been taking montelukast. Not taking an allergy medication. Denies wheezing.   HLD: Pt taking medication as directed without issues. No myalgias. Continues to work on weight loss with diet changes and physical activity.    Medications: Outpatient Medications Prior to Visit  Medication Sig   albuterol (PROAIR HFA) 108 (90 Base) MCG/ACT inhaler INHALE 2 PUFFS EVERY 4 HOURS AS NEEDED ONLY IF YOU CAN'T CATCH YOUR BREATH.   benzonatate (TESSALON) 200 MG capsule Take 1 capsule (200 mg total) by mouth 3 (three) times daily as needed for cough.   budesonide-formoterol (SYMBICORT) 80-4.5 MCG/ACT inhaler Inhale 2 puffs into the lungs 2 (two) times daily.   calcium-vitamin D (OSCAL WITH D) 500-200 MG-UNIT per tablet Take 1 tablet by mouth daily.   cholecalciferol (VITAMIN D) 1000 UNITS tablet Take 2,000 Units by mouth daily.    doxycycline (VIBRA-TABS) 100 MG tablet Take 1 tablet (100 mg total) by mouth 2 (two) times daily.   esomeprazole (NEXIUM) 20 MG capsule Take 20 mg by mouth daily at 12 noon.   famotidine (PEPCID) 20 MG tablet Take by mouth one at bedtime   fluticasone (FLONASE) 50 MCG/ACT nasal spray Place 2 sprays into the nose daily as needed. For allergies   guaiFENesin (MUCINEX) 600 MG 12 hr tablet Take by mouth 2 (two) times daily as needed.   ibuprofen (ADVIL,MOTRIN) 200 MG tablet Take 600 mg by mouth every 6 (six) hours as needed. For pain   montelukast (SINGULAIR) 10 MG tablet Take 1 tablet (10 mg total) by mouth at bedtime.   naproxen (NAPROSYN) 500 MG tablet Take  500 mg by mouth as needed.   polyethylene glycol (MIRALAX / GLYCOLAX) 17 g packet Take 17 g by mouth daily. 2-3 times weekly   [DISCONTINUED] rosuvastatin (CRESTOR) 5 MG tablet Take 1 tablet (5 mg total) by mouth once a week.   No facility-administered medications prior to visit.    Review of Systems Review of Systems:  A fourteen system review of systems was performed and found to be positive as per HPI.     Objective    BP 137/71   Pulse 70   Temp 97.7 F (36.5 C)   Ht _0  (1.778 m)   Wt 202 lb (91.6 kg)   SpO2 97%   BMI 28.98 kg/m  BP Readings from Last 3 Encounters:  02/15/22 137/71  02/01/22 (!) 157/82  08/19/21 124/76   Wt Readings from Last 3 Encounters:  02/15/22 202 lb (91.6 kg)  02/01/22 208 lb (94.3 kg)  08/19/21 220 lb 6.4 oz (100 kg)    Physical Exam  General:  Well Developed, well nourished, appropriate for stated age.  Neuro:  Alert and oriented,  extra-ocular muscles intact  HEENT:  Normocephalic, atraumatic, normal TM's of bother ears, mild erythema or posterior oropharynx, neck supple  Skin:  no gross rash, warm, pink. Cardiac:  RRR, S1 S2 Respiratory: CTA B/L  Vascular:  Ext warm, no cyanosis apprec.; cap RF less 2 sec. Psych:  No HI/SI, judgement and insight good, Euthymic  mood. Full Affect.   No results found for any visits on 02/15/22.  Assessment & Plan      Problem List Items Addressed This Visit       Respiratory   Intrinsic asthma    -Both lungs clear on exam. -Discussed with patient recent refill for Symbicort was for 10.2 g inhaler which should last for more than 30 days, patient reports she did not receive that. Advised to bring her inhaler at lab visit to verify inhaler size.  -Recommend to continue with montelukast.        Cough variant asthma     Other   Elevated LDL cholesterol level - Primary    -Discussed recent direct LDL which is elevated at 117. Patient is agreeable to increase Crestor 5 mg to twice a week. Will  repeat lipid panel and hepatic function in 6 weeks. -Recommend to continue with diet changes including low fat intake and continue to stay active. -Will continue to monitor.       Relevant Medications   rosuvastatin (CRESTOR) 5 MG tablet   Other Relevant Orders   Lipid Profile   Comp Met (CMET)   Other Visit Diagnoses     Seasonal allergies           Seasonal allergies: -Discussed with patient s/sx are suggestive of seasonal allergies and recommend to start taking oral antihistamine daily such as Claritin. Recommend to continue with montelukast.   Return in about 6 months (around 08/17/2022) for [facility]; lab visit in 6 wk to repeat lipid panel and hepatic function/pt bringing inhaler to verify size.        Lorrene Reid, PA-C  [facility] at [facility] [phone] (phone) [phone] (fax)  [place]

## 2022-02-17 ENCOUNTER — Telehealth: Payer: Self-pay | Admitting: Physician Assistant

## 2022-02-17 ENCOUNTER — Other Ambulatory Visit: Payer: Self-pay | Admitting: Physician Assistant

## 2022-02-17 DIAGNOSIS — E7800 Pure hypercholesterolemia, unspecified: Secondary | ICD-10-CM

## 2022-02-17 MED ORDER — ROSUVASTATIN CALCIUM 5 MG PO TABS: ORAL_TABLET | ORAL | 1 refills | Status: DC

## 2022-02-17 NOTE — Telephone Encounter (Signed)
Rx sent to pharmacy. AS, CMA 

## 2022-02-17 NOTE — Telephone Encounter (Signed)
Patient came in office and stated the cholesterol medication did not make it to the pharmacy. Can you resend please to [facility]?

## 2022-03-12 ENCOUNTER — Other Ambulatory Visit (HOSPITAL_COMMUNITY): Payer: Self-pay

## 2022-03-17 ENCOUNTER — Other Ambulatory Visit (HOSPITAL_COMMUNITY): Payer: Self-pay

## 2022-03-26 ENCOUNTER — Other Ambulatory Visit: Payer: Self-pay | Admitting: Physician Assistant

## 2022-03-26 DIAGNOSIS — E785 Hyperlipidemia, unspecified: Secondary | ICD-10-CM

## 2022-03-26 DIAGNOSIS — E7800 Pure hypercholesterolemia, unspecified: Secondary | ICD-10-CM

## 2022-03-26 DIAGNOSIS — Z0000 Encounter for general adult medical examination without abnormal findings: Secondary | ICD-10-CM

## 2022-03-29 ENCOUNTER — Other Ambulatory Visit: Payer: PPO

## 2022-03-29 DIAGNOSIS — Z0000 Encounter for general adult medical examination without abnormal findings: Secondary | ICD-10-CM

## 2022-03-29 DIAGNOSIS — E785 Hyperlipidemia, unspecified: Secondary | ICD-10-CM | POA: Diagnosis not present

## 2022-03-29 DIAGNOSIS — E7800 Pure hypercholesterolemia, unspecified: Secondary | ICD-10-CM

## 2022-03-30 LAB — LIPID PANEL
Chol/HDL Ratio: 3 ratio (ref 0.0–4.4)
Cholesterol, Total: 176 mg/dL (ref 100–199)
HDL: 59 mg/dL (ref >39)
LDL Chol Calc (NIH): 101 mg/dL — ABNORMAL HIGH (ref 0–99)
Triglycerides: 89 mg/dL (ref 0–149)
VLDL Cholesterol Cal: 16 mg/dL (ref 5–40)

## 2022-03-30 LAB — COMPREHENSIVE METABOLIC PANEL
ALT: 14 IU/L (ref 0–32)
AST: 13 IU/L (ref 0–40)
Albumin/Globulin Ratio: 2.4 — ABNORMAL HIGH (ref 1.2–2.2)
Albumin: 4.5 g/dL (ref 3.8–4.8)
Alkaline Phosphatase: 61 IU/L (ref 44–121)
BUN/Creatinine Ratio: 19 (ref 12–28)
BUN: 16 mg/dL (ref 8–27)
Bilirubin Total: 0.3 mg/dL (ref 0.0–1.2)
CO2: 24 mmol/L (ref 20–29)
Calcium: 10.5 mg/dL — ABNORMAL HIGH (ref 8.7–10.3)
Chloride: 106 mmol/L (ref 96–106)
Creatinine, Ser: 0.85 mg/dL (ref 0.57–1.00)
Globulin, Total: 1.9 g/dL (ref 1.5–4.5)
Glucose: 93 mg/dL (ref 70–99)
Potassium: 4.6 mmol/L (ref 3.5–5.2)
Sodium: 140 mmol/L (ref 134–144)
Total Protein: 6.4 g/dL (ref 6.0–8.5)
eGFR: 73 mL/min/{1.73_m2} (ref >59)

## 2022-03-30 LAB — HEPATIC FUNCTION PANEL: Bilirubin, Direct: 0.11 mg/dL (ref 0.00–0.40)

## 2022-04-03 IMAGING — DX DG CHEST 2V
2 series · 2 of 2 positions shown · non-contrast
Comparison: 06/22/2019 and 04/03/2015

CLINICAL DATA: Productive cough and fever.

EXAM:
CHEST - 2 VIEW

[chest pa]
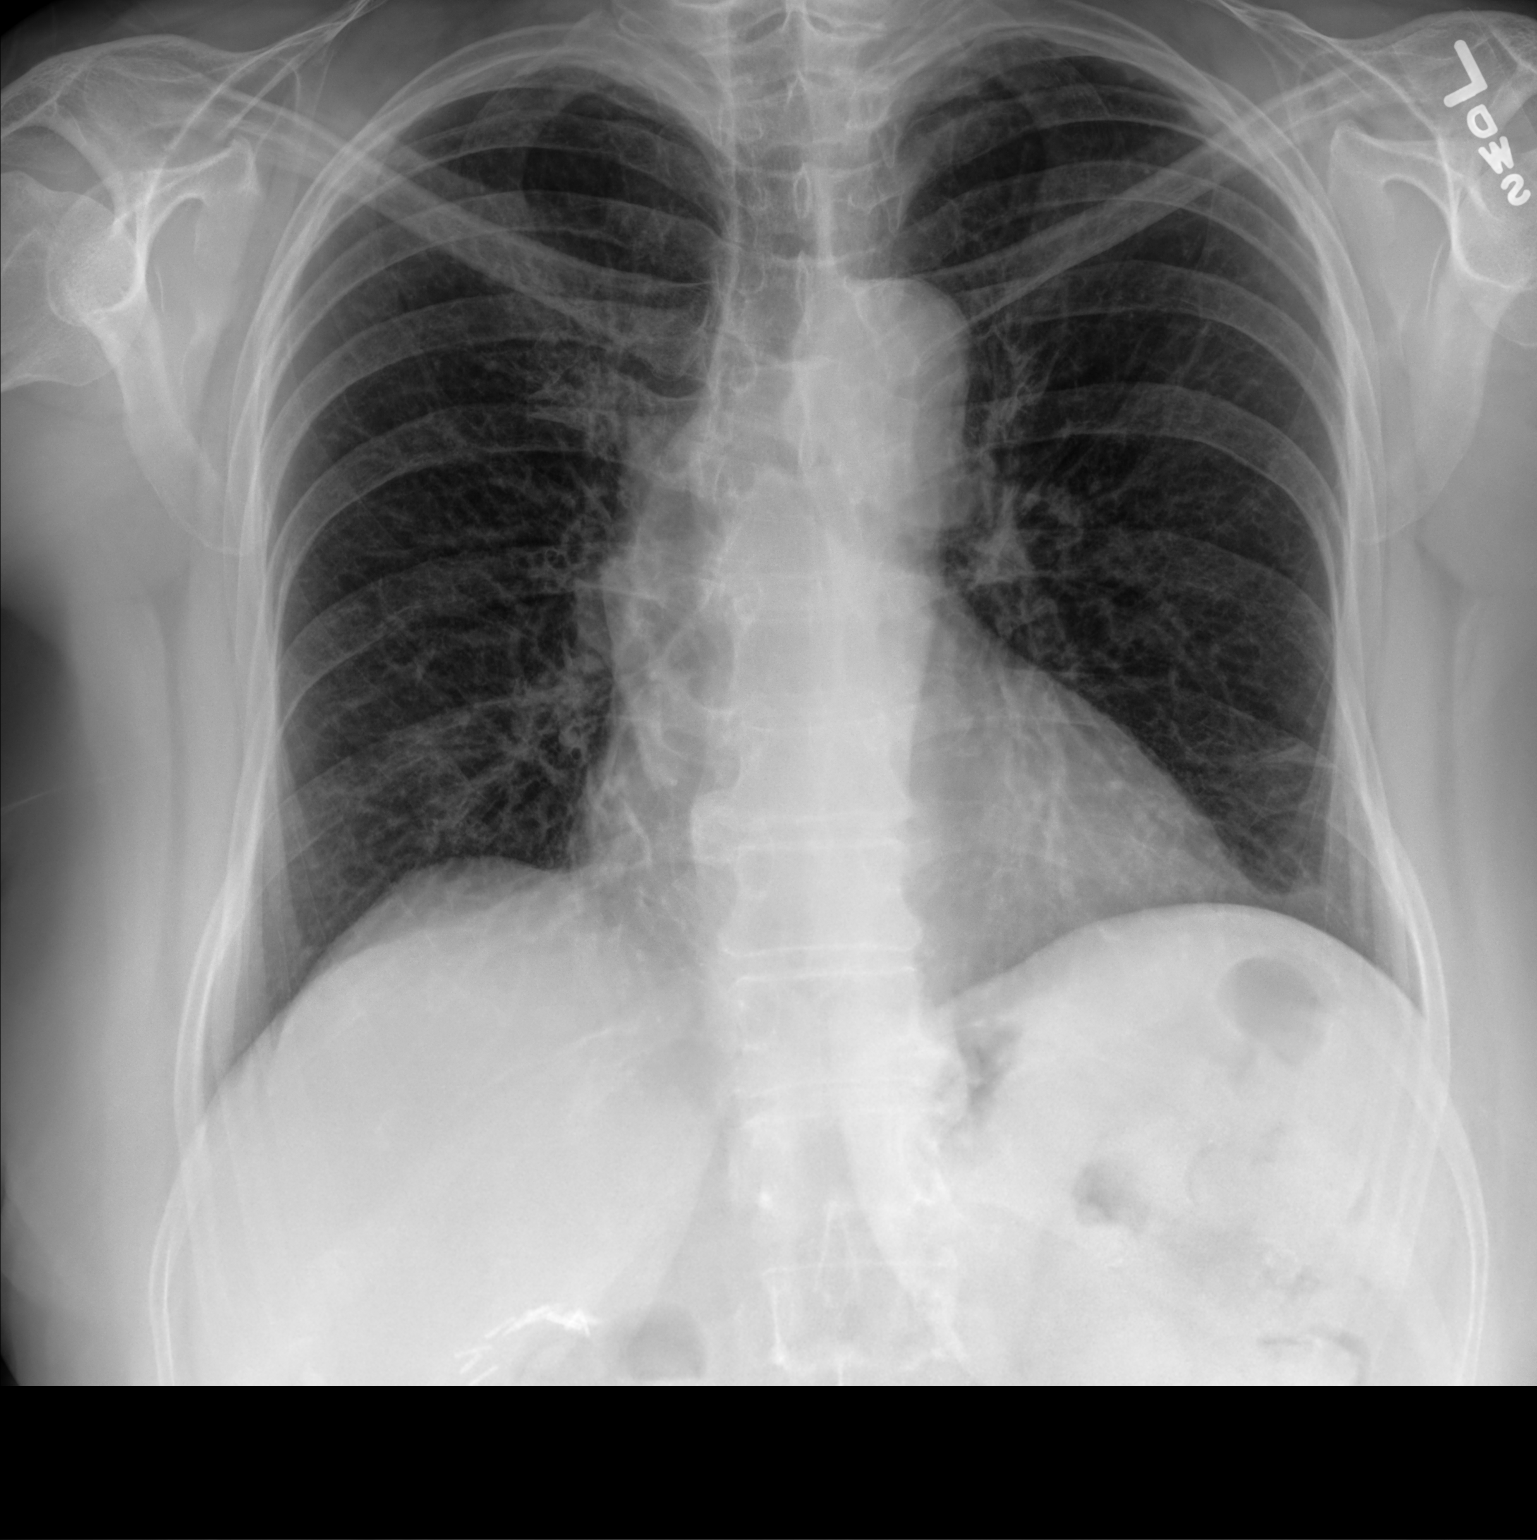

[chest lat]
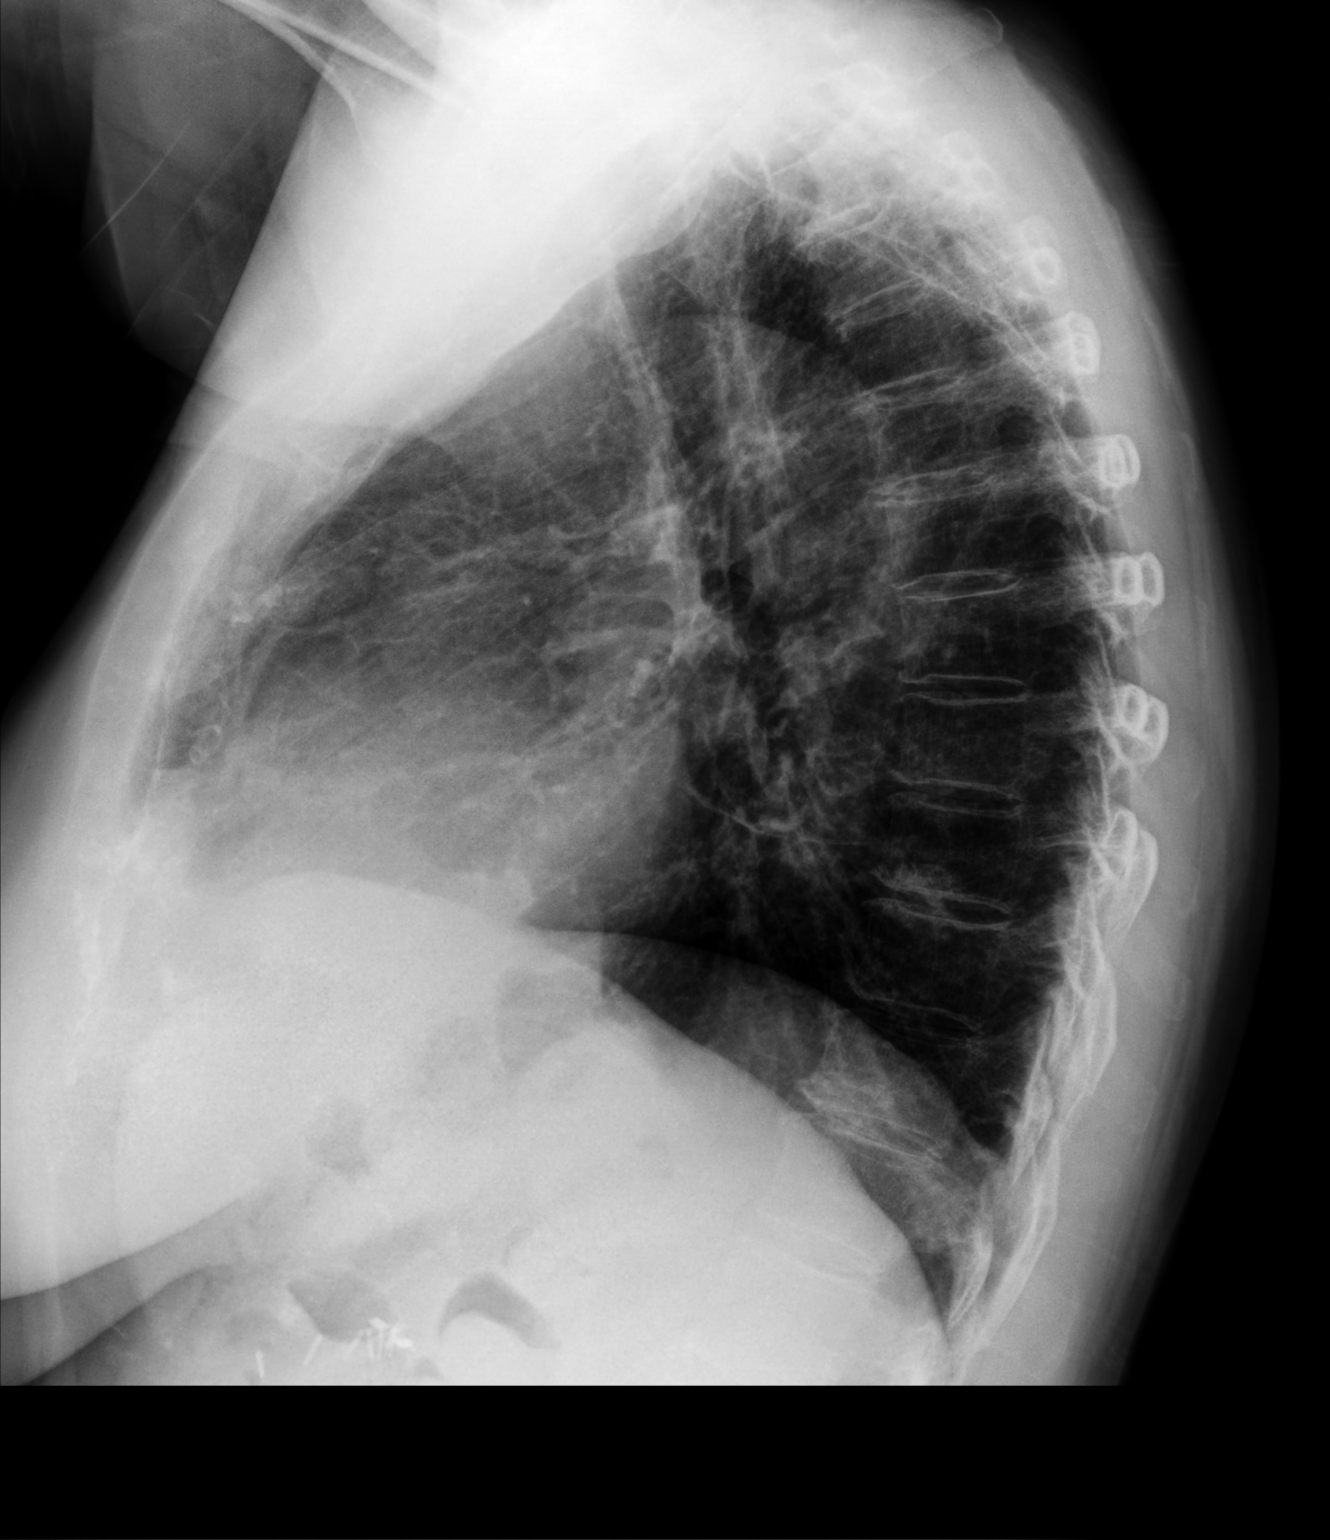

[2 of 2 positions shown; findings below may reference images not displayed]

FINDINGS: The heart size and pulmonary vascularity are normal and the lungs
are clear except for a tiny area scarring at the left lung base
laterally, stable.

No significant bone abnormality.
IMPRESSION: No active cardiopulmonary disease.

## 2022-04-06 ENCOUNTER — Other Ambulatory Visit (HOSPITAL_COMMUNITY): Payer: Self-pay

## 2022-04-09 DIAGNOSIS — H26492 Other secondary cataract, left eye: Secondary | ICD-10-CM | POA: Diagnosis not present

## 2022-04-09 DIAGNOSIS — H26493 Other secondary cataract, bilateral: Secondary | ICD-10-CM | POA: Diagnosis not present

## 2022-04-09 DIAGNOSIS — H02831 Dermatochalasis of right upper eyelid: Secondary | ICD-10-CM | POA: Diagnosis not present

## 2022-04-09 DIAGNOSIS — Z961 Presence of intraocular lens: Secondary | ICD-10-CM | POA: Diagnosis not present

## 2022-04-09 DIAGNOSIS — H18413 Arcus senilis, bilateral: Secondary | ICD-10-CM | POA: Diagnosis not present

## 2022-05-11 ENCOUNTER — Ambulatory Visit (INDEPENDENT_AMBULATORY_CARE_PROVIDER_SITE_OTHER): Payer: PPO | Admitting: Physician Assistant

## 2022-05-11 ENCOUNTER — Other Ambulatory Visit (HOSPITAL_COMMUNITY): Payer: Self-pay

## 2022-05-11 ENCOUNTER — Encounter: Payer: Self-pay | Admitting: Physician Assistant

## 2022-05-11 VITALS — BP 118/78 | HR 78 | Temp 97.7°F | Ht 70.0 in | Wt 205.0 lb

## 2022-05-11 DIAGNOSIS — J45991 Cough variant asthma: Secondary | ICD-10-CM | POA: Diagnosis not present

## 2022-05-11 DIAGNOSIS — J22 Unspecified acute lower respiratory infection: Secondary | ICD-10-CM | POA: Diagnosis not present

## 2022-05-11 DIAGNOSIS — J45909 Unspecified asthma, uncomplicated: Secondary | ICD-10-CM

## 2022-05-11 DIAGNOSIS — B9689 Other specified bacterial agents as the cause of diseases classified elsewhere: Secondary | ICD-10-CM | POA: Diagnosis not present

## 2022-05-11 MED ORDER — BUDESONIDE-FORMOTEROL FUMARATE 80-4.5 MCG/ACT IN AERO
2.0000 | INHALATION_SPRAY | Freq: Two times a day (BID) | RESPIRATORY_TRACT | 1 refills | Status: DC
  Filled 2022-05-11: qty 30.6, 90d supply, fill #0
  Filled 2022-08-02: qty 30.6, 90d supply, fill #1

## 2022-05-11 MED ORDER — METHYLPREDNISOLONE 4 MG PO TBPK: ORAL_TABLET | ORAL | 0 refills | Status: DC

## 2022-05-11 MED ORDER — DOXYCYCLINE HYCLATE 100 MG PO TABS: 100.0000 mg | ORAL_TABLET | Freq: Two times a day (BID) | ORAL | 0 refills | Status: AC

## 2022-05-11 NOTE — Patient Instructions (Signed)

## 2022-05-11 NOTE — Progress Notes (Signed)
Established patient acute visit   Patient: Cindy Rogers   DOB: 1951/09/13   71 y.o. Female  MRN: [id number] Visit Date: 05/11/2022  Chief Complaint  Patient presents with   Cough   Subjective    HPI  Patient presents with c/o productive cough with green sputum. Reports symptoms started about 10-12 days ago when she was at the beach and was going between hold and cold temperatures. Reports today her chest feels tight. Has been taking Mucinex and using Flonase with mild relief. No fever, headache, body aches or cold chills. Does report sinus pressure the last couple of days. Also reports intermittent feeling of hot water running down her ears towards jaw (R>L).     Medications: Outpatient Medications Prior to Visit  Medication Sig   albuterol (PROAIR HFA) 108 (90 Base) MCG/ACT inhaler INHALE 2 PUFFS EVERY 4 HOURS AS NEEDED ONLY IF YOU CAN'T CATCH YOUR BREATH.   benzonatate (TESSALON) 200 MG capsule Take 1 capsule (200 mg total) by mouth 3 (three) times daily as needed for cough.   calcium-vitamin D (OSCAL WITH D) 500-200 MG-UNIT per tablet Take 1 tablet by mouth daily.   cholecalciferol (VITAMIN D) 1000 UNITS tablet Take 2,000 Units by mouth daily.    esomeprazole (NEXIUM) 20 MG capsule Take 20 mg by mouth daily at 12 noon.   famotidine (PEPCID) 20 MG tablet Take by mouth one at bedtime   fluticasone (FLONASE) 50 MCG/ACT nasal spray Place 2 sprays into the nose daily as needed. For allergies   guaiFENesin (MUCINEX) 600 MG 12 hr tablet Take by mouth 2 (two) times daily as needed.   ibuprofen (ADVIL,MOTRIN) 200 MG tablet Take 600 mg by mouth every 6 (six) hours as needed. For pain   montelukast (SINGULAIR) 10 MG tablet Take 1 tablet (10 mg total) by mouth at bedtime.   naproxen (NAPROSYN) 500 MG tablet Take 500 mg by mouth as needed.   polyethylene glycol (MIRALAX / GLYCOLAX) 17 g packet Take 17 g by mouth daily. 2-3 times weekly   rosuvastatin (CRESTOR) 5 MG tablet Take 1 tablet by  mouth twice weekly.   [DISCONTINUED] budesonide-formoterol (SYMBICORT) 80-4.5 MCG/ACT inhaler Inhale 2 puffs into the lungs 2 (two) times daily.   [DISCONTINUED] doxycycline (VIBRA-TABS) 100 MG tablet Take 1 tablet (100 mg total) by mouth 2 (two) times daily.   No facility-administered medications prior to visit.    Review of Systems Review of Systems:  A fourteen system review of systems was performed and found to be positive as per HPI.  Last CBC Lab Results  Component Value Date   WBC 5.3 02/01/2022   HGB 12.1 02/01/2022   HCT 37.0 02/01/2022   MCV 89 02/01/2022   MCH 29.0 02/01/2022   RDW 13.3 02/01/2022   PLT 239 07/15/1116   Last metabolic panel Lab Results  Component Value Date   GLUCOSE 93 03/29/2022   NA 140 03/29/2022   K 4.6 03/29/2022   CL 106 03/29/2022   CO2 24 03/29/2022   BUN 16 03/29/2022   CREATININE 0.85 03/29/2022   EGFR 73 03/29/2022   CALCIUM 10.5 (H) 03/29/2022   PROT 6.4 03/29/2022   ALBUMIN 4.5 03/29/2022   LABGLOB 1.9 03/29/2022   AGRATIO 2.4 (H) 03/29/2022   BILITOT 0.3 03/29/2022   ALKPHOS 61 03/29/2022   AST 13 03/29/2022   ALT 14 03/29/2022   Last lipids Lab Results  Component Value Date   CHOL 176 03/29/2022   HDL 59 03/29/2022  LDLCALC 101 (H) 03/29/2022   LDLDIRECT 117 (H) 02/01/2022   TRIG 89 03/29/2022   CHOLHDL 3.0 03/29/2022   Last hemoglobin A1c Lab Results  Component Value Date   HGBA1C 5.6 08/14/2021   Last thyroid functions Lab Results  Component Value Date   TSH 3.540 08/14/2021   T3TOTAL 105 08/29/2018   Last vitamin D Lab Results  Component Value Date   VD25OH 40.2 07/16/2020   Last vitamin B12 and Folate Lab Results  Component Value Date   VITAMINB12 345 03/02/2017       Objective    BP 118/78   Pulse 78   Temp 97.7 F (36.5 C)   Ht 5' 10" (1.778 m)   Wt 205 lb (93 kg)   SpO2 99%   BMI 29.41 kg/m    Physical Exam  General:  Well Developed, well nourished, appropriate for stated age.   Neuro:  Alert and oriented,  extra-ocular muscles intact  HEENT:  Normocephalic, atraumatic, PERRL, no sinus tenderness, normal TM of left ear, some fluid behind right TM without redness or perforation, pink and moist nasal mucosa, mild erythema of posterior oropharynx, neck supple, no adenopathy   Skin:  no gross rash, warm, pink. Cardiac:  RRR Respiratory: mild rhonchi and dec air movement at bases, no crackles or rales. Vascular:  Ext warm, no cyanosis apprec.; cap RF less 2 sec. Psych:  No HI/SI, judgement and insight good, Euthymic mood. Full Affect.   No results found for any visits on 05/11/22.  Assessment & Plan     Patient has s/sx suggestive of bacterial lower respiratory infection and symptoms have failed to improve with home supportive care so will start antibiotic therapy with doxycycline 100 mg BID x 7 days and will start oral steroid taper to help improve breathing and reduce inflammation. Recommend to continue home supportive care. Discussed ear symptoms suggestive of ETD, less likely neuralgia, and if symptoms fail to improve or worsen we can consider referral to ENT. Also provided medication refills for Symbicort.   Return if symptoms worsen or fail to improve.        Lorrene Reid, PA-C  [facility] at [facility] [phone] (phone) [phone] (fax)  [place]

## 2022-07-05 DIAGNOSIS — Z1231 Encounter for screening mammogram for malignant neoplasm of breast: Secondary | ICD-10-CM | POA: Diagnosis not present

## 2022-07-05 LAB — HM MAMMOGRAPHY

## 2022-07-14 ENCOUNTER — Encounter: Payer: Self-pay | Admitting: Physician Assistant

## 2022-08-02 ENCOUNTER — Other Ambulatory Visit (HOSPITAL_COMMUNITY): Payer: Self-pay

## 2022-08-02 ENCOUNTER — Encounter: Payer: Self-pay | Admitting: Nurse Practitioner

## 2022-08-02 DIAGNOSIS — J45909 Unspecified asthma, uncomplicated: Secondary | ICD-10-CM

## 2022-08-02 DIAGNOSIS — J45991 Cough variant asthma: Secondary | ICD-10-CM

## 2022-08-02 DIAGNOSIS — E7800 Pure hypercholesterolemia, unspecified: Secondary | ICD-10-CM

## 2022-08-02 MED ORDER — ROSUVASTATIN CALCIUM 5 MG PO TABS: ORAL_TABLET | ORAL | 0 refills | Status: DC

## 2022-08-02 MED ORDER — MONTELUKAST SODIUM 10 MG PO TABS: 10.0000 mg | ORAL_TABLET | Freq: Every day | ORAL | 0 refills | Status: DC

## 2022-08-12 DIAGNOSIS — M1712 Unilateral primary osteoarthritis, left knee: Secondary | ICD-10-CM | POA: Diagnosis not present

## 2022-08-19 DIAGNOSIS — M1712 Unilateral primary osteoarthritis, left knee: Secondary | ICD-10-CM | POA: Diagnosis not present

## 2022-08-23 ENCOUNTER — Ambulatory Visit: Payer: PPO | Admitting: Physician Assistant

## 2022-08-24 ENCOUNTER — Other Ambulatory Visit (HOSPITAL_COMMUNITY): Payer: Self-pay

## 2022-08-24 ENCOUNTER — Ambulatory Visit (INDEPENDENT_AMBULATORY_CARE_PROVIDER_SITE_OTHER): Payer: PPO | Admitting: Nurse Practitioner

## 2022-08-24 ENCOUNTER — Encounter: Payer: Self-pay | Admitting: Nurse Practitioner

## 2022-08-24 VITALS — BP 114/67 | HR 96 | Ht 70.0 in | Wt 210.2 lb

## 2022-08-24 DIAGNOSIS — Z6830 Body mass index (BMI) 30.0-30.9, adult: Secondary | ICD-10-CM

## 2022-08-24 DIAGNOSIS — J45909 Unspecified asthma, uncomplicated: Secondary | ICD-10-CM

## 2022-08-24 DIAGNOSIS — J45991 Cough variant asthma: Secondary | ICD-10-CM

## 2022-08-24 DIAGNOSIS — Z0000 Encounter for general adult medical examination without abnormal findings: Secondary | ICD-10-CM

## 2022-08-24 DIAGNOSIS — E7800 Pure hypercholesterolemia, unspecified: Secondary | ICD-10-CM | POA: Diagnosis not present

## 2022-08-24 MED ORDER — ROSUVASTATIN CALCIUM 5 MG PO TABS
5.0000 mg | ORAL_TABLET | ORAL | 3 refills | Status: DC
  Filled 2022-08-24 – 2022-11-04 (×2): qty 24, 84d supply, fill #0
  Filled 2023-02-07: qty 24, 84d supply, fill #1
  Filled 2023-05-05: qty 24, 84d supply, fill #2
  Filled 2023-08-04: qty 24, 84d supply, fill #3

## 2022-08-24 MED ORDER — MONTELUKAST SODIUM 10 MG PO TABS
10.0000 mg | ORAL_TABLET | Freq: Every day | ORAL | 3 refills | Status: DC
  Filled 2022-08-24 – 2022-08-25 (×2): qty 90, 90d supply, fill #0
  Filled 2022-12-02: qty 90, 90d supply, fill #1
  Filled 2023-02-24: qty 90, 90d supply, fill #2
  Filled 2023-06-06: qty 90, 90d supply, fill #3

## 2022-08-24 NOTE — Progress Notes (Signed)
Subjective:   Cindy Rogers is a 71 y.o. female who presents for Medicare Annual (Subsequent) preventive examination. -hyperlipidemia  --taking rosuvastatin twice weekly to keep LDL around 100   -patent seeing orthopedic provider for left knee pain  -getting a series of gel shots. She is about to get third injection.  -also has meniscus cyst on the left side.  -scheduled for colonoscopy next month  --sees GI tomorrow  --having bowel habit changes and is to see provider prior to the colonoscopy procedure.   denies chest pain, chest pressure, or shortness of breath. She denies headaches or visual disturbances. She denies abdominal pain, nausea, vomiting, or changes in bowel or bladder habits.    Review of Systems    See HPI       Objective:    Today's Vitals   08/24/22 1404  BP: 114/67  Pulse: 96  SpO2: 98%  Weight: 210 lb 3.2 oz (95.3 kg)  Height: '5\' 10"'$  (1.778 m)   Body mass index is 30.16 kg/m.    Row Labels 09/30/2018    8:14 PM 01/26/2017    1:09 PM 12/10/2011    4:32 AM  Advanced Directives   Section Header. No data exists in this row.     Does Patient Have a Medical Advance Directive?   No Yes Patient has advance directive, copy not in chart  Type of Advance Directive     Living will  Would patient like information on creating a medical advance directive?   No - Patient declined      Current Medications (verified) Outpatient Encounter Medications as of 08/24/2022  Medication Sig   albuterol (PROAIR HFA) 108 (90 Base) MCG/ACT inhaler INHALE 2 PUFFS EVERY 4 HOURS AS NEEDED ONLY IF YOU CAN'T CATCH YOUR BREATH.   budesonide-formoterol (SYMBICORT) 80-4.5 MCG/ACT inhaler Inhale 2 puffs into the lungs 2 (two) times daily.   calcium-vitamin D (OSCAL WITH D) 500-200 MG-UNIT per tablet Take 1 tablet by mouth daily.   cholecalciferol (VITAMIN D) 1000 UNITS tablet Take 2,000 Units by mouth daily.    esomeprazole (NEXIUM) 20 MG capsule Take 20 mg by mouth daily at 12 noon.    famotidine (PEPCID) 20 MG tablet Take by mouth one at bedtime   fluticasone (FLONASE) 50 MCG/ACT nasal spray Place 2 sprays into the nose daily as needed. For allergies   guaiFENesin (MUCINEX) 600 MG 12 hr tablet Take by mouth 2 (two) times daily as needed.   ibuprofen (ADVIL,MOTRIN) 200 MG tablet Take 600 mg by mouth every 6 (six) hours as needed. For pain   naproxen (NAPROSYN) 500 MG tablet Take 500 mg by mouth as needed.   polyethylene glycol (MIRALAX / GLYCOLAX) 17 g packet Take 17 g by mouth daily. 2-3 times weekly   [DISCONTINUED] montelukast (SINGULAIR) 10 MG tablet Take 1 tablet (10 mg total) by mouth at bedtime.   [DISCONTINUED] rosuvastatin (CRESTOR) 5 MG tablet Take 1 tablet by mouth twice weekly.   montelukast (SINGULAIR) 10 MG tablet Take 1 tablet (10 mg total) by mouth at bedtime.   rosuvastatin (CRESTOR) 5 MG tablet Take 1 tablet by mouth twice weekly.   [DISCONTINUED] benzonatate (TESSALON) 200 MG capsule Take 1 capsule (200 mg total) by mouth 3 (three) times daily as needed for cough.   [DISCONTINUED] methylPREDNISolone (MEDROL DOSEPAK) 4 MG TBPK tablet Take as directed on package.   No facility-administered encounter medications on file as of 08/24/2022.    Allergies (verified) Food, Shellfish allergy, Codeine, Penicillins, Sulfa  antibiotics, and Neosporin af [miconazole]   History: Past Medical History:  Diagnosis Date   Asthma    Asthma    Phreesia 07/15/2020   GERD (gastroesophageal reflux disease)    Headache(784.0)    Pneumonia    PONV (postoperative nausea and vomiting)    Shortness of breath    Past Surgical History:  Procedure Laterality Date   ABDOMINAL HYSTERECTOMY     CHOLECYSTECTOMY     RHINOPLASTY     Family History  Problem Relation Age of Onset   Emphysema Father        smoked   Heart disease Father    Kidney cancer Father        with mets to liver and retina   Heart disease Mother    Social History   Socioeconomic History   Marital  status: Married    Spouse name: Not on file   Number of children: Not on file   Years of education: Not on file   Highest education level: Not on file  Occupational History   Occupation: Retired Associate Professor: [employer]  Tobacco Use   Smoking status: Never   Smokeless tobacco: Never  Substance and Sexual Activity   Alcohol use: Yes    Comment: glass of wine approx twice per wk   Drug use: No   Sexual activity: Yes    Birth control/protection: Post-menopausal  Other Topics Concern   Not on file  Social History Narrative   Not on file   Social Determinants of Health   Financial Resource Strain: Low Risk  (08/24/2022)   Overall Financial Resource Strain (CARDIA)    Difficulty of Paying Living Expenses: Not hard at all  Food Insecurity: No Food Insecurity (08/24/2022)   Hunger Vital Sign    Worried About Running Out of Food in the Last Year: Never true    [place] in the Last Year: Never true  Transportation Needs: No Transportation Needs (08/24/2022)   PRAPARE - Hydrologist (Medical): No    Lack of Transportation (Non-Medical): No  Physical Activity: Sufficiently Active (08/24/2022)   Exercise Vital Sign    Days of Exercise per Week: 3 days    Minutes of Exercise per Session: 50 min  Stress: No Stress Concern Present (08/24/2022)   [place]    Feeling of Stress : Only a little  Social Connections: Socially Integrated (08/24/2022)   Social Connection and Isolation Panel [NHANES]    Frequency of Communication with Friends and Family: More than three times a week    Frequency of Social Gatherings with Friends and Family: More than three times a week    Attends Religious Services: More than 4 times per year    Active Member of [employer] or Organizations: Yes    Attends Music therapist: More than 4 times per year    Marital Status: Married   Physical Exam Vitals and nursing note reviewed.  Constitutional:      Appearance: Normal appearance. She is well-developed.  HENT:     Head: Normocephalic and atraumatic.     Right Ear: Tympanic membrane, ear canal and external ear normal.     Left Ear: Tympanic membrane, ear canal and external ear normal.     Nose: Nose normal.     Mouth/Throat:     Mouth: Mucous membranes are moist.     Pharynx: Oropharynx is clear.  Eyes:     Extraocular Movements: Extraocular movements intact.     Conjunctiva/sclera: Conjunctivae normal.     Pupils: Pupils are equal, round, and reactive to light.  Neck:     Vascular: No carotid bruit.  Cardiovascular:     Rate and Rhythm: Normal rate and regular rhythm.     Pulses: Normal pulses.     Heart sounds: Normal heart sounds.  Pulmonary:     Effort: Pulmonary effort is normal.     Breath sounds: Normal breath sounds.  Abdominal:     General: Bowel sounds are normal. There is no distension.     Palpations: Abdomen is soft. There is no mass.     Tenderness: There is no abdominal tenderness. There is no right CVA tenderness, left CVA tenderness, guarding or rebound.  Musculoskeletal:        General: Normal range of motion.     Cervical back: Normal range of motion and neck supple.  Lymphadenopathy:     Cervical: No cervical adenopathy.  Skin:    General: Skin is warm and dry.     Capillary Refill: Capillary refill takes less than 2 seconds.  Neurological:     General: No focal deficit present.     Mental Status: She is alert and oriented to person, place, and time.  Psychiatric:        Mood and Affect: Mood normal.        Behavior: Behavior normal.        Thought Content: Thought content normal.        Judgment: Judgment normal.      Tobacco Counseling Counseling given: Not Answered   Clinical Intake:  Pre-visit preparation completed: Yes  Pain : No/denies pain     BMI - recorded: 30.16 Nutritional Status: BMI > 30   Obese Nutritional Risks: None Diabetes: No  How often do you need to have someone help you when you read instructions, pamphlets, or other written materials from your doctor or pharmacy?: 1 - Never  Diabetic?no  Interpreter Needed?: No      Activities of Daily Living   Row Labels 08/24/2022    2:02 PM 05/11/2022    2:51 PM  In your present state of health, do you have any difficulty performing the following activities:   Section Header. No data exists in this row.    Hearing?   0 0  Vision?   0 0  Difficulty concentrating or making decisions?   0 0  Walking or climbing stairs?   0 0  Dressing or bathing?   0 0  Doing errands, shopping?   0 0    Patient Care Team: Lorrene Reid, PA-C as PCP - General (Physician Assistant) Tanda Rockers, MD as Consulting Physician (Pulmonary Disease) Berle Mull, MD as Consulting Physician (Sports Medicine) Sydnee Levans, MD as Consulting Physician (Dermatology) Darleen Crocker, MD as Consulting Physician (Ophthalmology)  Indicate any recent Medical Services you may have received from other than [facility] providers in the past year (date may be approximate).     Assessment:  1. Encounter for Medicare annual wellness exam Annual medicare wellness visit today   2. Cough variant asthma Use singulair and inhaled medications as prescribed. Continue regular visits with pulmonology.  - montelukast (SINGULAIR) 10 MG tablet; Take 1 tablet (10 mg total) by mouth at bedtime.  Dispense: 90 tablet; Refill: 3  3. Intrinsic asthma Use singulair and inhaled medications as prescribed. Continue regular visits with pulmonology.  - montelukast (  SINGULAIR) 10 MG tablet; Take 1 tablet (10 mg total) by mouth at bedtime.  Dispense: 90 tablet; Refill: 3  4. Elevated LDL cholesterol level Continue to take crestor 5 mg twice weekly. Recheck fasting lipids in one year.  - rosuvastatin (CRESTOR) 5 MG tablet; Take 1 tablet by mouth twice weekly.  Dispense: 90  tablet; Refill: 3  5. BMI 30.0-30.9,adult Discussed lowering calorie intake to 1500 calories per day and incorporating exercise into daily routine to help lose weight.    Hearing/Vision screen No results found.   Depression Screen   Row Labels 08/24/2022    2:02 PM 05/11/2022    2:50 PM 02/15/2022    8:27 AM 02/01/2022    9:28 AM 08/17/2021    9:49 AM 02/02/2021    8:19 AM 07/16/2020    8:55 AM  PHQ 2/9 Scores   Section Header. No data exists in this row.         PHQ - 2 Score   0 0 0 0 0 0 0  PHQ- 9 Score   '1 1  2 '$ 0 0 0    Fall Risk   Row Labels 05/11/2022    2:50 PM 02/15/2022    8:26 AM 02/01/2022    9:28 AM 08/17/2021    9:48 AM 02/02/2021    8:18 AM  Fall Risk    Section Header. No data exists in this row.       Falls in the past year?   0 0 0 0 0  Number falls in past yr:   0 0 0 0 0  Injury with Fall?   0 0 0 0 0  Risk for fall due to :   No Fall Risks No Fall Risks No Fall Risks No Fall Risks No Fall Risks  Follow up   Falls evaluation completed Falls evaluation completed Falls evaluation completed Falls evaluation completed Falls evaluation completed    [place]:  Any stairs in or around the home? Yes  If so, are there any without handrails? Yes  Home free of loose throw rugs in walkways, pet beds, electrical cords, etc? Yes  Adequate lighting in your home to reduce risk of falls? Yes   ASSISTIVE DEVICES UTILIZED TO PREVENT FALLS:  Life alert? No  Use of a cane, walker or w/c? No  Grab bars in the bathroom? Yes  Shower chair or bench in shower? No  Elevated toilet seat or a handicapped toilet? Yes   TIMED UP AND GO:  Was the test performed? Yes .  Length of time to ambulate 10 feet: 10 sec.   Gait steady and fast without use of assistive device  Cognitive Function:       Row Labels 08/24/2022    2:05 PM 08/17/2021    9:38 AM 07/16/2020    8:51 AM 01/01/2019   11:08 AM 03/02/2017    9:27 AM  6CIT Screen   Section Header.  No data exists in this row.       What Year?   0 points 0 points 0 points 0 points 0 points  What month?   0 points 0 points 0 points 0 points 0 points  What time?   0 points 0 points 0 points 0 points 0 points  Count back from 20   0 points 0 points 0 points 0 points 0 points  Months in reverse   0 points 0 points 0 points 0 points 0 points  Repeat phrase   0 points 0 points 0 points 2 points 0 points  Total Score   0 points 0 points 0 points 2 points 0 points    Immunizations Immunization History  Administered Date(s) Administered   Fluad Quad(high Dose 65+) 05/31/2019, 06/18/2021   Influenza Split 06/13/2013, 06/13/2014   Influenza Whole 06/13/2016   Influenza, High Dose Seasonal PF 06/22/2018, 06/23/2022   Influenza-Unspecified 05/26/2017, 06/23/2020, 06/23/2022   PFIZER(Purple Top)SARS-COV-2 Vaccination 10/19/2019, 11/13/2019, 07/05/2020, 04/06/2021, 09/16/2021   Pfizer Covid-19 Vaccine Bivalent Booster 50yr & up 08/22/2022   Pneumococcal Conjugate-13 02/04/2016   Pneumococcal Polysaccharide-23 04/21/2011   RSV,unspecified 08/10/2022   Respiratory Syncytial Virus Vaccine,Recomb Aduvanted(Arexvy) 08/10/2022   Tdap 03/27/2009, 09/11/2018   Zoster Recombinat (Shingrix) 03/08/2017, 05/26/2017   Zoster, Live 08/02/2011    TDAP status: Up to date  Flu Vaccine status: Due, Education has been provided regarding the importance of this vaccine. Advised may receive this vaccine at local pharmacy or Health Dept. Aware to provide a copy of the vaccination record if obtained from local pharmacy or Health Dept. Verbalized acceptance and understanding.  Pneumococcal vaccine status: Due, Education has been provided regarding the importance of this vaccine. Advised may receive this vaccine at local pharmacy or Health Dept. Aware to provide a copy of the vaccination record if obtained from local pharmacy or Health Dept. Verbalized acceptance and understanding.  Covid-19 vaccine status:  Completed vaccines  Qualifies for Shingles Vaccine? Yes   Zostavax completed Yes   Shingrix Completed?: Yes  Screening Tests Health Maintenance  Topic Date Due   Pneumonia Vaccine 71 Years old (3 - PPSV23 or PCV20) 02/03/2021   COVID-19 Vaccine (7 - 2023-24 season) 10/17/2022   Medicare Annual Wellness (AWV)  08/25/2023   MAMMOGRAM  07/05/2024   COLONOSCOPY (Pts 45-422yrInsurance coverage will need to be confirmed)  06/02/2027   DTaP/Tdap/Td (3 - Td or Tdap) 09/11/2028   INFLUENZA VACCINE  Completed   DEXA SCAN  Completed   Hepatitis C Screening  Completed   Zoster Vaccines- Shingrix  Completed   HPV VACCINES  Aged Out    Health Maintenance  Health Maintenance Due  Topic Date Due   Pneumonia Vaccine [age]Years old (3 69 PPSV23 or PCV20) 02/03/2021    Colorectal cancer screening: Type of screening: Colonoscopy. Completed 2018. Repeat every 10 years  Mammogram status: Completed 2023. Repeat every year  Bone Density status: Completed 2019. Results reflect: Bone density results: NORMAL. Repeat every n/a years.  Lung Cancer Screening: (Low Dose CT Chest recommended if Age 71-80ears, 30 pack-year currently smoking OR have quit w/in 15years.) does not qualify.   Lung Cancer Screening Referral: n/a  Additional Screening:  Hepatitis C Screening: does not qualify; Completed 2019  Vision Screening: Recommended annual ophthalmology exams for early detection of glaucoma and other disorders of the eye. Is the patient up to date with their annual eye exam?  Yes  Who is the provider or what is the name of the office in which the patient attends annual eye exams? [facility] If pt is not established with a provider, would they like to be referred to a provider to establish care? No .   Dental Screening: Recommended annual dental exams for proper oral hygiene  Community Resource Referral / Chronic Care Management: CRR required this visit?  No   CCM required this visit?   No      Plan:     I have personally reviewed and noted the following in the patient's  chart:   Medical and social history Use of alcohol, tobacco or illicit drugs  Current medications and supplements including opioid prescriptions. Patient is not currently taking opioid prescriptions. Functional ability and status Nutritional status Physical activity Advanced directives List of other physicians Hospitalizations, surgeries, and ER visits in previous 12 months Vitals Screenings to include cognitive, depression, and falls Referrals and appointments  In addition, I have reviewed and discussed with patient certain preventive protocols, quality metrics, and best practice recommendations. A written personalized care plan for preventive services as well as general preventive health recommendations were provided to patient.     Ronnell Freshwater, NP   09/13/2022   Nurse Notes: face to face 25 min

## 2022-08-25 ENCOUNTER — Other Ambulatory Visit (HOSPITAL_COMMUNITY): Payer: Self-pay

## 2022-08-25 DIAGNOSIS — R194 Change in bowel habit: Secondary | ICD-10-CM | POA: Diagnosis not present

## 2022-08-25 DIAGNOSIS — R1031 Right lower quadrant pain: Secondary | ICD-10-CM | POA: Diagnosis not present

## 2022-08-25 DIAGNOSIS — K219 Gastro-esophageal reflux disease without esophagitis: Secondary | ICD-10-CM | POA: Diagnosis not present

## 2022-08-25 DIAGNOSIS — K5900 Constipation, unspecified: Secondary | ICD-10-CM | POA: Diagnosis not present

## 2022-08-25 DIAGNOSIS — R110 Nausea: Secondary | ICD-10-CM | POA: Diagnosis not present

## 2022-08-25 DIAGNOSIS — K921 Melena: Secondary | ICD-10-CM | POA: Diagnosis not present

## 2022-08-26 DIAGNOSIS — M1712 Unilateral primary osteoarthritis, left knee: Secondary | ICD-10-CM | POA: Diagnosis not present

## 2022-09-01 ENCOUNTER — Telehealth: Payer: Self-pay | Admitting: *Deleted

## 2022-09-01 NOTE — Telephone Encounter (Signed)
Spoke with patient and informed her that per Junius Creamer, FNP it is recommended that she goes to [facility] or ED for evaluation due to her symptoms worsening. Patient has agreed with advisement. All questions and concerns have been addressed.

## 2022-09-01 NOTE — Telephone Encounter (Signed)
Thank you :)

## 2022-09-01 NOTE — Telephone Encounter (Signed)
Pt calling saying she saw provider last week and on Sunday she started having fever, nasal congestion.  Said it just wont break and she is wanting to see about getting medication sent in so it does not turn into bronchitis.  She wanted to see if you could look at her history and send in medication.  I informed her that she could try a virtual visit or go to UC/ED.  And she said to ask first and then let her know. Cindy Rogers, CMA

## 2022-09-03 ENCOUNTER — Ambulatory Visit
Admission: EM | Admit: 2022-09-03 | Discharge: 2022-09-03 | Disposition: A | Discharge status: Home / Self Care | Payer: PPO | Attending: Physician Assistant | Admitting: Physician Assistant

## 2022-09-03 DIAGNOSIS — J45901 Unspecified asthma with (acute) exacerbation: Secondary | ICD-10-CM

## 2022-09-03 MED ORDER — PREDNISONE 20 MG PO TABS: 40.0000 mg | ORAL_TABLET | Freq: Every day | ORAL | 0 refills | Status: AC

## 2022-09-03 MED ORDER — AZITHROMYCIN 250 MG PO TABS: 250.0000 mg | ORAL_TABLET | Freq: Every day | ORAL | 0 refills | Status: DC

## 2022-09-03 NOTE — ED Triage Notes (Signed)
[facility] presents to [facility] with co of sore throat, sinus pain, pmh of asthma and new onset of cough with congestions and phlegm. Pt reports musinex and tylenol sinus

## 2022-09-03 NOTE — ED Provider Notes (Signed)
[facility]    CSN: [id number] Arrival date & time: 09/03/22  0816      History   Chief Complaint Chief Complaint  Patient presents with   URI    HPI Cindy Rogers is a 71 y.o. female.   Patient here today for evaluation of sore throat, sinus pain, cough and wheezing that started about 5 days ago.  She reports that this is typical for her asthma exacerbations.  She has been taking Mucinex and Tylenol with mild relief.  She does report some fever with Tmax of 10 1 at night.  She did take COVID screening at home that was negative.  The history is provided by the patient.  URI Presenting symptoms: congestion, cough, fever and sore throat   Presenting symptoms: no ear pain   Associated symptoms: wheezing     Past Medical History:  Diagnosis Date   Asthma    Asthma    Phreesia 07/15/2020   GERD (gastroesophageal reflux disease)    Headache(784.0)    Pneumonia    PONV (postoperative nausea and vomiting)    Shortness of breath     Patient Active Problem List   Diagnosis Date Noted   Dental infection 08/19/2021   Jaw pain 08/19/2021   Encounter for Medicare annual wellness exam 01/01/2019   Elevated LDL cholesterol level 09/11/2018   Elevated TSH 08/30/2018   Fever 09/12/2017   Acute otitis media 09/12/2017   Vitamin D deficiency 05/02/2017   Healthcare maintenance 05/02/2017   Hyperlipidemia 03/03/2017   Medicare annual wellness visit, subsequent 03/02/2017   Chronic idiopathic constipation 01/26/2017   Bronchitis 01/26/2017   Osteoporosis of femur without pathological fracture 05/10/2014   Cough variant asthma 10/13/2013   Intrinsic asthma 12/10/2011    Past Surgical History:  Procedure Laterality Date   ABDOMINAL HYSTERECTOMY     CHOLECYSTECTOMY     RHINOPLASTY      OB History   No obstetric history on file.      Home Medications    Prior to Admission medications   Medication Sig Start Date End Date Taking? Authorizing Provider   azithromycin (ZITHROMAX) 250 MG tablet Take 1 tablet (250 mg total) by mouth daily. Take first 2 tablets together, then 1 every day until finished. 09/03/22  Yes Francene Finders, PA-C  predniSONE (DELTASONE) 20 MG tablet Take 2 tablets (40 mg total) by mouth daily with breakfast for 5 days. 09/03/22 09/08/22 Yes Francene Finders, PA-C  albuterol (PROAIR HFA) 108 (90 Base) MCG/ACT inhaler INHALE 2 PUFFS EVERY 4 HOURS AS NEEDED ONLY IF YOU CAN'T CATCH YOUR BREATH. 09/28/19   Danford, Valetta Fuller D, NP  budesonide-formoterol (SYMBICORT) 80-4.5 MCG/ACT inhaler Inhale 2 puffs into the lungs 2 (two) times daily. 05/11/22   Lorrene Reid, PA-C  calcium-vitamin D (OSCAL WITH D) 500-200 MG-UNIT per tablet Take 1 tablet by mouth daily.    [provider]  cholecalciferol (VITAMIN D) 1000 UNITS tablet Take 2,000 Units by mouth daily.     [provider]  esomeprazole (NEXIUM) 20 MG capsule Take 20 mg by mouth daily at 12 noon.    [provider]  famotidine (PEPCID) 20 MG tablet Take by mouth one at bedtime    [provider]  fluticasone (FLONASE) 50 MCG/ACT nasal spray Place 2 sprays into the nose daily as needed. For allergies    [provider]  guaiFENesin (MUCINEX) 600 MG 12 hr tablet Take by mouth 2 (two) times daily as needed.  [provider]  ibuprofen (ADVIL,MOTRIN) 200 MG tablet Take 600 mg by mouth every 6 (six) hours as needed. For pain    [provider]  montelukast (SINGULAIR) 10 MG tablet Take 1 tablet (10 mg total) by mouth at bedtime. 08/24/22   Ronnell Freshwater, NP  naproxen (NAPROSYN) 500 MG tablet Take 500 mg by mouth as needed.    [provider]  polyethylene glycol (MIRALAX / GLYCOLAX) 17 g packet Take 17 g by mouth daily. 2-3 times weekly    [provider]  rosuvastatin (CRESTOR) 5 MG tablet Take 1 tablet by mouth twice weekly. 08/24/22   Ronnell Freshwater, NP    Family History Family History  Problem  Relation Age of Onset   Emphysema Father        smoked   Heart disease Father    Kidney cancer Father        with mets to liver and retina   Heart disease Mother     Social History Social History   Tobacco Use   Smoking status: Never   Smokeless tobacco: Never  Substance Use Topics   Alcohol use: Yes    Comment: glass of wine approx twice per wk   Drug use: No     Allergies   Food, Shellfish allergy, Codeine, Penicillins, Sulfa antibiotics, and Neosporin af [miconazole]   Review of Systems Review of Systems  Constitutional:  Positive for fever.  HENT:  Positive for congestion, sinus pressure and sore throat. Negative for ear pain.   Eyes:  Negative for discharge and redness.  Respiratory:  Positive for cough and wheezing. Negative for shortness of breath.   Gastrointestinal:  Negative for abdominal pain, diarrhea, nausea and vomiting.     Physical Exam Triage Vital Signs ED Triage Vitals  Enc Vitals Group     BP 09/03/22 0840 (!) 150/83     Pulse Rate 09/03/22 0840 66     Resp 09/03/22 0840 16     Temp 09/03/22 0840 98.7 F (37.1 C)     Temp Source 09/03/22 0840 Oral     SpO2 09/03/22 0840 98 %     Weight --      Height --      Head Circumference --      Peak Flow --      Pain Score 09/03/22 0839 3     Pain Loc --      Pain Edu? --      Excl. in [place]? --    No data found.  Updated Vital Signs BP (!) 150/83   Pulse 66   Temp 98.7 F (37.1 C) (Oral)   Resp 16   SpO2 98%      Physical Exam Vitals and nursing note reviewed.  Constitutional:      General: She is not in acute distress.    Appearance: Normal appearance. She is not ill-appearing.  HENT:     Head: Normocephalic and atraumatic.     Nose: Congestion present.     Mouth/Throat:     Mouth: Mucous membranes are moist.     Pharynx: Oropharynx is clear. No oropharyngeal exudate or posterior oropharyngeal erythema.  Eyes:     Conjunctiva/sclera: Conjunctivae normal.  Cardiovascular:      Rate and Rhythm: Normal rate and regular rhythm.     Heart sounds: Normal heart sounds.  Pulmonary:     Effort: Pulmonary effort is normal. No respiratory distress.     Breath sounds: Wheezing (  mild, diffuse) present. No rhonchi or rales.  Neurological:     Mental Status: She is alert.  Psychiatric:        Mood and Affect: Mood normal.        Behavior: Behavior normal.        Thought Content: Thought content normal.      UC Treatments / Results  Labs (all labs ordered are listed, but only abnormal results are displayed) Labs Reviewed - No data to display  EKG   Radiology No results found.  Procedures Procedures (including critical care time)  Medications Ordered in UC Medications - No data to display  Initial Impression / Assessment and Plan / UC Course  I have reviewed the triage vital signs and the nursing notes.  Pertinent labs & imaging results that were available during my care of the patient were reviewed by me and considered in my medical decision making (see chart for details).    Will treat to cover asthma exacerbation with steroid burst and Z-Pak.  Recommend symptomatic treatment otherwise.  Encouraged follow-up if no gradual improvement or with any further concerns.  Patient expresses understanding.  Final Clinical Impressions(s) / UC Diagnoses   Final diagnoses:  Asthma with acute exacerbation, unspecified asthma severity, unspecified whether persistent   Discharge Instructions   None    ED Prescriptions     Medication Sig Dispense Auth. Provider   predniSONE (DELTASONE) 20 MG tablet Take 2 tablets (40 mg total) by mouth daily with breakfast for 5 days. 10 tablet Ewell Poe F, PA-C   azithromycin (ZITHROMAX) 250 MG tablet Take 1 tablet (250 mg total) by mouth daily. Take first 2 tablets together, then 1 every day until finished. 6 tablet Francene Finders, PA-C      PDMP not reviewed this encounter.   Francene Finders, PA-C 09/03/22 1058

## 2022-09-13 DIAGNOSIS — Z6830 Body mass index (BMI) 30.0-30.9, adult: Secondary | ICD-10-CM | POA: Insufficient documentation

## 2022-10-12 DIAGNOSIS — D120 Benign neoplasm of cecum: Secondary | ICD-10-CM | POA: Diagnosis not present

## 2022-10-12 DIAGNOSIS — R194 Change in bowel habit: Secondary | ICD-10-CM | POA: Diagnosis not present

## 2022-10-12 DIAGNOSIS — K2930 Chronic superficial gastritis without bleeding: Secondary | ICD-10-CM | POA: Diagnosis not present

## 2022-10-12 DIAGNOSIS — K317 Polyp of stomach and duodenum: Secondary | ICD-10-CM | POA: Diagnosis not present

## 2022-10-12 DIAGNOSIS — K219 Gastro-esophageal reflux disease without esophagitis: Secondary | ICD-10-CM | POA: Diagnosis not present

## 2022-10-12 DIAGNOSIS — K921 Melena: Secondary | ICD-10-CM | POA: Diagnosis not present

## 2022-10-12 DIAGNOSIS — K2990 Gastroduodenitis, unspecified, without bleeding: Secondary | ICD-10-CM | POA: Diagnosis not present

## 2022-10-12 DIAGNOSIS — D123 Benign neoplasm of transverse colon: Secondary | ICD-10-CM | POA: Diagnosis not present

## 2022-10-12 DIAGNOSIS — K635 Polyp of colon: Secondary | ICD-10-CM | POA: Diagnosis not present

## 2022-10-12 DIAGNOSIS — D122 Benign neoplasm of ascending colon: Secondary | ICD-10-CM | POA: Diagnosis not present

## 2022-10-12 DIAGNOSIS — R140 Abdominal distension (gaseous): Secondary | ICD-10-CM | POA: Diagnosis not present

## 2022-10-12 DIAGNOSIS — K648 Other hemorrhoids: Secondary | ICD-10-CM | POA: Diagnosis not present

## 2022-10-12 DIAGNOSIS — K3189 Other diseases of stomach and duodenum: Secondary | ICD-10-CM | POA: Diagnosis not present

## 2022-10-12 DIAGNOSIS — D126 Benign neoplasm of colon, unspecified: Secondary | ICD-10-CM | POA: Diagnosis not present

## 2022-10-12 LAB — HM COLONOSCOPY

## 2022-10-29 DIAGNOSIS — K900 Celiac disease: Secondary | ICD-10-CM | POA: Diagnosis not present

## 2022-11-03 ENCOUNTER — Encounter: Payer: Self-pay | Admitting: Family Medicine

## 2022-11-04 ENCOUNTER — Other Ambulatory Visit: Payer: Self-pay

## 2022-11-04 ENCOUNTER — Other Ambulatory Visit (HOSPITAL_COMMUNITY): Payer: Self-pay

## 2022-11-04 DIAGNOSIS — R208 Other disturbances of skin sensation: Secondary | ICD-10-CM | POA: Diagnosis not present

## 2022-11-04 DIAGNOSIS — L918 Other hypertrophic disorders of the skin: Secondary | ICD-10-CM | POA: Diagnosis not present

## 2022-11-04 DIAGNOSIS — D225 Melanocytic nevi of trunk: Secondary | ICD-10-CM | POA: Diagnosis not present

## 2022-11-04 DIAGNOSIS — D2371 Other benign neoplasm of skin of right lower limb, including hip: Secondary | ICD-10-CM | POA: Diagnosis not present

## 2022-11-04 DIAGNOSIS — L720 Epidermal cyst: Secondary | ICD-10-CM | POA: Diagnosis not present

## 2022-11-04 DIAGNOSIS — D22121 Melanocytic nevi of left upper eyelid, including canthus: Secondary | ICD-10-CM | POA: Diagnosis not present

## 2022-11-04 DIAGNOSIS — L821 Other seborrheic keratosis: Secondary | ICD-10-CM | POA: Diagnosis not present

## 2022-11-04 DIAGNOSIS — D1801 Hemangioma of skin and subcutaneous tissue: Secondary | ICD-10-CM | POA: Diagnosis not present

## 2022-11-04 DIAGNOSIS — L814 Other melanin hyperpigmentation: Secondary | ICD-10-CM | POA: Diagnosis not present

## 2022-11-10 ENCOUNTER — Telehealth: Payer: Self-pay

## 2022-11-10 ENCOUNTER — Other Ambulatory Visit (HOSPITAL_COMMUNITY): Payer: Self-pay

## 2022-11-10 ENCOUNTER — Other Ambulatory Visit: Payer: Self-pay

## 2022-11-10 DIAGNOSIS — J45991 Cough variant asthma: Secondary | ICD-10-CM

## 2022-11-10 DIAGNOSIS — J45909 Unspecified asthma, uncomplicated: Secondary | ICD-10-CM

## 2022-11-10 MED ORDER — BUDESONIDE-FORMOTEROL FUMARATE 80-4.5 MCG/ACT IN AERO
2.0000 | INHALATION_SPRAY | Freq: Two times a day (BID) | RESPIRATORY_TRACT | 1 refills | Status: DC
  Filled 2022-11-10: qty 30.6, 90d supply, fill #0
  Filled 2022-11-12: qty 10.2, 30d supply, fill #0

## 2022-11-10 NOTE — Telephone Encounter (Signed)
Pt is asking if she can continue seeing Nira Conn since she saw you after Maritza left?

## 2022-11-10 NOTE — Telephone Encounter (Signed)
Refill sent to [facility]

## 2022-11-10 NOTE — Telephone Encounter (Signed)
Pt is requesting refill on: budesonide-formoterol (SYMBICORT) 80-4.5 MCG/ACT inhaler   Pharmacy: [facility]   LOV 08/24/22 ROV 02/23/23

## 2022-11-10 NOTE — Telephone Encounter (Signed)
I don't have a problem with that

## 2022-11-10 NOTE — Telephone Encounter (Signed)
Called pt to let her know provider was ok with continuing care for pt. I switched PCP and also switch provider for scheduled appt in June.

## 2022-11-11 DIAGNOSIS — K219 Gastro-esophageal reflux disease without esophagitis: Secondary | ICD-10-CM | POA: Diagnosis not present

## 2022-11-11 DIAGNOSIS — K581 Irritable bowel syndrome with constipation: Secondary | ICD-10-CM | POA: Diagnosis not present

## 2022-11-12 ENCOUNTER — Other Ambulatory Visit (HOSPITAL_COMMUNITY): Payer: Self-pay

## 2022-11-12 ENCOUNTER — Other Ambulatory Visit: Payer: Self-pay

## 2022-11-16 DIAGNOSIS — M1712 Unilateral primary osteoarthritis, left knee: Secondary | ICD-10-CM | POA: Diagnosis not present

## 2022-11-16 DIAGNOSIS — M25562 Pain in left knee: Secondary | ICD-10-CM | POA: Diagnosis not present

## 2022-11-22 DIAGNOSIS — M23007 Cystic meniscus, unspecified meniscus, left knee: Secondary | ICD-10-CM | POA: Diagnosis not present

## 2022-12-02 ENCOUNTER — Other Ambulatory Visit (HOSPITAL_COMMUNITY): Payer: Self-pay

## 2022-12-02 ENCOUNTER — Other Ambulatory Visit: Payer: Self-pay

## 2022-12-09 ENCOUNTER — Telehealth: Payer: Self-pay

## 2022-12-09 NOTE — Telephone Encounter (Signed)
For this patient, will you call her to see if the insurance gave her something that has the alternatives they will cover? They should have. If not, she will have to contact her insurance to see what they will cover.

## 2022-12-09 NOTE — Telephone Encounter (Signed)
Pt is calling after being advised that her insurance will no longer cover budesonide-formoterol (SYMBICORT) 80-4.5 MCG/ACT inhaler.  Pt is requesting to speak to [facility] or Katrina to discuss what they will cover. Pt didn't want to discuss the new options with me.  Please call [phone]

## 2022-12-09 NOTE — Telephone Encounter (Signed)
Called pt she stated the insurance gave her a list but none of them were for asthma, so she wanted to know was there an alternative for Symbicort.or can she just ween herself off. She stated she wanted to speak with the provider

## 2022-12-16 ENCOUNTER — Other Ambulatory Visit: Payer: Self-pay | Admitting: Nurse Practitioner

## 2022-12-16 DIAGNOSIS — J45909 Unspecified asthma, uncomplicated: Secondary | ICD-10-CM

## 2022-12-16 MED ORDER — FLUTICASONE FUROATE-VILANTEROL 100-25 MCG/ACT IN AEPB: 1.0000 | INHALATION_SPRAY | Freq: Every day | RESPIRATORY_TRACT | 5 refills | Status: DC

## 2022-12-16 NOTE — Telephone Encounter (Signed)
Please let the patient know that Adair Patter is one that was listed and is used for asthma at the lower dose. I have sent a new prescription for this to [facility]. She should let me know if she has further concern.  Thanks so much.   -HB

## 2022-12-17 NOTE — Telephone Encounter (Signed)
Called pt she is advised of he Rx that was sent to the [facility]

## 2023-01-26 ENCOUNTER — Other Ambulatory Visit: Payer: Self-pay | Admitting: Nurse Practitioner

## 2023-01-26 DIAGNOSIS — E559 Vitamin D deficiency, unspecified: Secondary | ICD-10-CM

## 2023-01-26 DIAGNOSIS — E785 Hyperlipidemia, unspecified: Secondary | ICD-10-CM

## 2023-01-26 DIAGNOSIS — R7989 Other specified abnormal findings of blood chemistry: Secondary | ICD-10-CM

## 2023-01-26 DIAGNOSIS — Z0000 Encounter for general adult medical examination without abnormal findings: Secondary | ICD-10-CM

## 2023-02-08 ENCOUNTER — Other Ambulatory Visit (HOSPITAL_COMMUNITY): Payer: Self-pay

## 2023-02-16 ENCOUNTER — Other Ambulatory Visit: Payer: PPO

## 2023-02-16 DIAGNOSIS — Z0000 Encounter for general adult medical examination without abnormal findings: Secondary | ICD-10-CM

## 2023-02-16 DIAGNOSIS — E785 Hyperlipidemia, unspecified: Secondary | ICD-10-CM

## 2023-02-16 DIAGNOSIS — R7989 Other specified abnormal findings of blood chemistry: Secondary | ICD-10-CM | POA: Diagnosis not present

## 2023-02-16 DIAGNOSIS — E559 Vitamin D deficiency, unspecified: Secondary | ICD-10-CM

## 2023-02-17 DIAGNOSIS — M25562 Pain in left knee: Secondary | ICD-10-CM | POA: Diagnosis not present

## 2023-02-17 LAB — CBC WITH DIFFERENTIAL/PLATELET
Basophils Absolute: 0.1 10*3/uL (ref 0.0–0.2)
Basos: 1 %
EOS (ABSOLUTE): 0.2 10*3/uL (ref 0.0–0.4)
Eos: 5 %
Hematocrit: 39.7 % (ref 34.0–46.6)
Hemoglobin: 12.6 g/dL (ref 11.1–15.9)
Immature Grans (Abs): 0 10*3/uL (ref 0.0–0.1)
Immature Granulocytes: 0 %
Lymphocytes Absolute: 1.4 10*3/uL (ref 0.7–3.1)
Lymphs: 36 %
MCH: 28.8 pg (ref 26.6–33.0)
MCHC: 31.7 g/dL (ref 31.5–35.7)
MCV: 91 fL (ref 79–97)
Monocytes Absolute: 0.4 10*3/uL (ref 0.1–0.9)
Monocytes: 9 %
Neutrophils Absolute: 1.8 10*3/uL (ref 1.4–7.0)
Neutrophils: 49 %
Platelets: 188 10*3/uL (ref 150–450)
RBC: 4.37 x10E6/uL (ref 3.77–5.28)
RDW: 14.4 % (ref 11.7–15.4)
WBC: 3.8 10*3/uL (ref 3.4–10.8)

## 2023-02-17 LAB — COMPREHENSIVE METABOLIC PANEL
ALT: 13 IU/L (ref 0–32)
AST: 13 IU/L (ref 0–40)
Albumin/Globulin Ratio: 2.5 — ABNORMAL HIGH (ref 1.2–2.2)
Albumin: 4.5 g/dL (ref 3.8–4.8)
Alkaline Phosphatase: 65 IU/L (ref 44–121)
BUN/Creatinine Ratio: 25 (ref 12–28)
BUN: 24 mg/dL (ref 8–27)
Bilirubin Total: 0.5 mg/dL (ref 0.0–1.2)
CO2: 23 mmol/L (ref 20–29)
Calcium: 10.3 mg/dL (ref 8.7–10.3)
Chloride: 107 mmol/L — ABNORMAL HIGH (ref 96–106)
Creatinine, Ser: 0.97 mg/dL (ref 0.57–1.00)
Globulin, Total: 1.8 g/dL (ref 1.5–4.5)
Glucose: 90 mg/dL (ref 70–99)
Potassium: 4.8 mmol/L (ref 3.5–5.2)
Sodium: 142 mmol/L (ref 134–144)
Total Protein: 6.3 g/dL (ref 6.0–8.5)
eGFR: 62 mL/min/{1.73_m2} (ref >59)

## 2023-02-17 LAB — LIPID PANEL
Chol/HDL Ratio: 2.7 ratio (ref 0.0–4.4)
Cholesterol, Total: 175 mg/dL (ref 100–199)
HDL: 66 mg/dL (ref >39)
LDL Chol Calc (NIH): 94 mg/dL (ref 0–99)
Triglycerides: 78 mg/dL (ref 0–149)
VLDL Cholesterol Cal: 15 mg/dL (ref 5–40)

## 2023-02-17 LAB — VITAMIN D 25 HYDROXY (VIT D DEFICIENCY, FRACTURES): Vit D, 25-Hydroxy: 37.5 ng/mL (ref 30.0–100.0)

## 2023-02-17 LAB — HEMOGLOBIN A1C
Est. average glucose Bld gHb Est-mCnc: 120 mg/dL
Hgb A1c MFr Bld: 5.8 % — ABNORMAL HIGH (ref 4.8–5.6)

## 2023-02-17 LAB — TSH: TSH: 6.22 u[IU]/mL — ABNORMAL HIGH (ref 0.450–4.500)

## 2023-02-23 ENCOUNTER — Encounter: Payer: Self-pay | Admitting: Nurse Practitioner

## 2023-02-23 ENCOUNTER — Ambulatory Visit (INDEPENDENT_AMBULATORY_CARE_PROVIDER_SITE_OTHER): Payer: PPO | Admitting: Nurse Practitioner

## 2023-02-23 VITALS — BP 103/67 | HR 70 | Ht 70.0 in | Wt 213.1 lb

## 2023-02-23 DIAGNOSIS — E782 Mixed hyperlipidemia: Secondary | ICD-10-CM | POA: Diagnosis not present

## 2023-02-23 DIAGNOSIS — B370 Candidal stomatitis: Secondary | ICD-10-CM | POA: Diagnosis not present

## 2023-02-23 DIAGNOSIS — J45991 Cough variant asthma: Secondary | ICD-10-CM | POA: Diagnosis not present

## 2023-02-23 MED ORDER — NYSTATIN 100000 UNIT/ML MT SUSP
5.0000 mL | Freq: Three times a day (TID) | OROMUCOSAL | 1 refills | Status: DC
Start: 2023-02-23 — End: 2023-08-25

## 2023-02-23 NOTE — Progress Notes (Signed)
Established patient visit   Patient: Cindy Rogers   DOB: 01/06/1951   71 y.o. Female  MRN: [id number] Visit Date: 02/23/2023   Chief Complaint  Patient presents with   Medical Management of Chronic Issues   Subjective    HPI  Follow up  -HLD. Currently on crestor 5 mg daily  --fasting lipid panel collected 02/16/2023 was normal  -slight  elevation of TSH.  -HgbA1c 5.8 with glucose of 90 -Has noticed plaques of thrush in her mouth despite rinsing each time she uses inhalers. -She denies chest pain, chest pressure, or shortness of breath. She denies headaches or visual disturbances. She denies abdominal pain, nausea, vomiting, or changes in bowel or bladder habits.    Medications: Outpatient Medications Prior to Visit  Medication Sig   albuterol (PROAIR HFA) 108 (90 Base) MCG/ACT inhaler INHALE 2 PUFFS EVERY 4 HOURS AS NEEDED ONLY IF YOU CAN'T CATCH YOUR BREATH.   calcium-vitamin D (OSCAL WITH D) 500-200 MG-UNIT per tablet Take 1 tablet by mouth daily.   cholecalciferol (VITAMIN D) 1000 UNITS tablet Take 2,000 Units by mouth daily.    esomeprazole (NEXIUM) 20 MG capsule Take 20 mg by mouth daily at 12 noon.   famotidine (PEPCID) 20 MG tablet Take by mouth one at bedtime   fluticasone (FLONASE) 50 MCG/ACT nasal spray Place 2 sprays into the nose daily as needed. For allergies   fluticasone furoate-vilanterol (BREO ELLIPTA) 100-25 MCG/ACT AEPB Inhale 1 puff into the lungs daily.   guaiFENesin (MUCINEX) 600 MG 12 hr tablet Take by mouth 2 (two) times daily as needed.   ibuprofen (ADVIL,MOTRIN) 200 MG tablet Take 600 mg by mouth every 6 (six) hours as needed. For pain   montelukast (SINGULAIR) 10 MG tablet Take 1 tablet (10 mg total) by mouth at bedtime.   naproxen (NAPROSYN) 500 MG tablet Take 500 mg by mouth as needed.   polyethylene glycol (MIRALAX / GLYCOLAX) 17 g packet Take 17 g by mouth daily. 2-3 times weekly   rosuvastatin (CRESTOR) 5 MG tablet Take 1 tablet (5 mg total) by mouth  2 (two) times a week.   [DISCONTINUED] azithromycin (ZITHROMAX) 250 MG tablet Take 1 tablet (250 mg total) by mouth daily. Take first 2 tablets together, then 1 every day until finished.   No facility-administered medications prior to visit.    Review of Systems See HPI    Last CBC Lab Results  Component Value Date   WBC 3.8 02/16/2023   HGB 12.6 02/16/2023   HCT 39.7 02/16/2023   MCV 91 02/16/2023   MCH 28.8 02/16/2023   RDW 14.4 02/16/2023   PLT 188 02/16/2023   Last metabolic panel Lab Results  Component Value Date   GLUCOSE 90 02/16/2023   NA 142 02/16/2023   K 4.8 02/16/2023   CL 107 (H) 02/16/2023   CO2 23 02/16/2023   BUN 24 02/16/2023   CREATININE 0.97 02/16/2023   EGFR 62 02/16/2023   CALCIUM 10.3 02/16/2023   PROT 6.3 02/16/2023   ALBUMIN 4.5 02/16/2023   LABGLOB 1.8 02/16/2023   AGRATIO 2.5 (H) 02/16/2023   BILITOT 0.5 02/16/2023   ALKPHOS 65 02/16/2023   AST 13 02/16/2023   ALT 13 02/16/2023   Last lipids Lab Results  Component Value Date   CHOL 175 02/16/2023   HDL 66 02/16/2023   LDLCALC 94 02/16/2023   LDLDIRECT 117 (H) 02/01/2022   TRIG 78 02/16/2023   CHOLHDL 2.7 02/16/2023   Last hemoglobin A1c Lab Results  Component Value Date   HGBA1C 5.8 (H) 02/16/2023   Last thyroid functions Lab Results  Component Value Date   TSH 6.220 (H) 02/16/2023   T3TOTAL 105 08/29/2018   Last vitamin D Lab Results  Component Value Date   VD25OH 37.5 02/16/2023       Objective     Today's Vitals   02/23/23 0933  BP: 103/67  Pulse: 70  SpO2: 96%  Weight: 213 lb 1.9 oz (96.7 kg)  Height: 5\' 10"  (1.778 m)   Body mass index is 30.58 kg/m.  BP Readings from Last 3 Encounters:  02/23/23 103/67  09/03/22 (Abnormal) 150/83  08/24/22 114/67    Wt Readings from Last 3 Encounters:  02/23/23 213 lb 1.9 oz (96.7 kg)  08/24/22 210 lb 3.2 oz (95.3 kg)  05/11/22 205 lb (93 kg)    Physical Exam Vitals and nursing note reviewed.  Constitutional:       Appearance: Normal appearance. She is well-developed.  HENT:     Head: Normocephalic and atraumatic.     Nose: Nose normal.     Mouth/Throat:     Mouth: Mucous membranes are moist.     Pharynx: Oropharynx is clear.     Comments: There are white plaques of thrush present on the tongue and buccal mucosa. Eyes:     Extraocular Movements: Extraocular movements intact.     Conjunctiva/sclera: Conjunctivae normal.     Pupils: Pupils are equal, round, and reactive to light.  Neck:     Vascular: No carotid bruit.  Cardiovascular:     Rate and Rhythm: Normal rate and regular rhythm.     Pulses: Normal pulses.     Heart sounds: Normal heart sounds.  Pulmonary:     Effort: Pulmonary effort is normal.     Breath sounds: Normal breath sounds.  Abdominal:     Palpations: Abdomen is soft.  Musculoskeletal:        General: Normal range of motion.     Cervical back: Normal range of motion and neck supple.  Lymphadenopathy:     Cervical: No cervical adenopathy.  Skin:    General: Skin is warm and dry.     Capillary Refill: Capillary refill takes less than 2 seconds.  Neurological:     General: No focal deficit present.     Mental Status: She is alert and oriented to person, place, and time.  Psychiatric:        Mood and Affect: Mood normal.        Behavior: Behavior normal.        Thought Content: Thought content normal.        Judgment: Judgment normal.      Assessment & Plan    Mixed hyperlipidemia Assessment & Plan: Most recent lipid panel -   Lipid Panel     Component Value Date/Time   CHOL 175 02/16/2023 0817   TRIG 78 02/16/2023 0817   HDL 66 02/16/2023 0817   CHOLHDL 2.7 02/16/2023 0817   LDLCALC 94 02/16/2023 0817   LDLDIRECT 117 (H) 02/01/2022 1000   LABVLDL 15 02/16/2023 0817   Conitnue Crestor as prescribed    Cough variant asthma Assessment & Plan: Continue to use inhalers as prescribed. -Rinse mouth thoroughly after each use of inhaled  medications.   Oral candidiasis Assessment & Plan: New prescription for nystatin swish and swallow given -Advised to use 3 times daily and after use of inhaled medications. -Use for 7 to 10 days and as needed.  Orders: -     Nystatin; Take 5 mLs (500,000 Units total) by mouth 3 (three) times daily.  Dispense: 200 mL; Refill: 1     Return in about 6 months (around 08/25/2023) for HLD, asthma - MWV in 6 months .         Carlean Jews, NP  [facility] [phone] (phone) [phone] (fax)  [facility]

## 2023-02-24 ENCOUNTER — Other Ambulatory Visit (HOSPITAL_COMMUNITY): Payer: Self-pay

## 2023-02-28 DIAGNOSIS — M25562 Pain in left knee: Secondary | ICD-10-CM | POA: Diagnosis not present

## 2023-03-13 DIAGNOSIS — B370 Candidal stomatitis: Secondary | ICD-10-CM | POA: Insufficient documentation

## 2023-03-13 NOTE — Assessment & Plan Note (Signed)
Continue to use inhalers as prescribed. -Rinse mouth thoroughly after each use of inhaled medications.

## 2023-03-13 NOTE — Assessment & Plan Note (Signed)
Most recent lipid panel -   Lipid Panel     Component Value Date/Time   CHOL 175 02/16/2023 0817   TRIG 78 02/16/2023 0817   HDL 66 02/16/2023 0817   CHOLHDL 2.7 02/16/2023 0817   LDLCALC 94 02/16/2023 0817   LDLDIRECT 117 (H) 02/01/2022 1000   LABVLDL 15 02/16/2023 0817   Conitnue Crestor as prescribed

## 2023-03-13 NOTE — Assessment & Plan Note (Signed)
New prescription for nystatin swish and swallow given -Advised to use 3 times daily and after use of inhaled medications. -Use for 7 to 10 days and as needed.

## 2023-03-31 DIAGNOSIS — S83242A Other tear of medial meniscus, current injury, left knee, initial encounter: Secondary | ICD-10-CM | POA: Diagnosis not present

## 2023-03-31 DIAGNOSIS — S83272A Complex tear of lateral meniscus, current injury, left knee, initial encounter: Secondary | ICD-10-CM | POA: Diagnosis not present

## 2023-03-31 DIAGNOSIS — M2242 Chondromalacia patellae, left knee: Secondary | ICD-10-CM | POA: Diagnosis not present

## 2023-03-31 DIAGNOSIS — S83282A Other tear of lateral meniscus, current injury, left knee, initial encounter: Secondary | ICD-10-CM | POA: Diagnosis not present

## 2023-03-31 DIAGNOSIS — G8918 Other acute postprocedural pain: Secondary | ICD-10-CM | POA: Diagnosis not present

## 2023-03-31 DIAGNOSIS — S83232A Complex tear of medial meniscus, current injury, left knee, initial encounter: Secondary | ICD-10-CM | POA: Diagnosis not present

## 2023-03-31 DIAGNOSIS — M67462 Ganglion, left knee: Secondary | ICD-10-CM | POA: Diagnosis not present

## 2023-04-04 DIAGNOSIS — R269 Unspecified abnormalities of gait and mobility: Secondary | ICD-10-CM | POA: Diagnosis not present

## 2023-04-04 DIAGNOSIS — M23042 Cystic meniscus, anterior horn of lateral meniscus, left knee: Secondary | ICD-10-CM | POA: Diagnosis not present

## 2023-04-04 DIAGNOSIS — M25662 Stiffness of left knee, not elsewhere classified: Secondary | ICD-10-CM | POA: Diagnosis not present

## 2023-04-04 DIAGNOSIS — S83222D Peripheral tear of medial meniscus, current injury, left knee, subsequent encounter: Secondary | ICD-10-CM | POA: Diagnosis not present

## 2023-04-04 DIAGNOSIS — M6281 Muscle weakness (generalized): Secondary | ICD-10-CM | POA: Diagnosis not present

## 2023-04-11 DIAGNOSIS — M23042 Cystic meniscus, anterior horn of lateral meniscus, left knee: Secondary | ICD-10-CM | POA: Diagnosis not present

## 2023-04-12 DIAGNOSIS — R269 Unspecified abnormalities of gait and mobility: Secondary | ICD-10-CM | POA: Diagnosis not present

## 2023-04-12 DIAGNOSIS — S83222D Peripheral tear of medial meniscus, current injury, left knee, subsequent encounter: Secondary | ICD-10-CM | POA: Diagnosis not present

## 2023-04-12 DIAGNOSIS — M23042 Cystic meniscus, anterior horn of lateral meniscus, left knee: Secondary | ICD-10-CM | POA: Diagnosis not present

## 2023-04-12 DIAGNOSIS — M6281 Muscle weakness (generalized): Secondary | ICD-10-CM | POA: Diagnosis not present

## 2023-04-12 DIAGNOSIS — M25662 Stiffness of left knee, not elsewhere classified: Secondary | ICD-10-CM | POA: Diagnosis not present

## 2023-04-18 DIAGNOSIS — S83222D Peripheral tear of medial meniscus, current injury, left knee, subsequent encounter: Secondary | ICD-10-CM | POA: Diagnosis not present

## 2023-04-18 DIAGNOSIS — R269 Unspecified abnormalities of gait and mobility: Secondary | ICD-10-CM | POA: Diagnosis not present

## 2023-04-18 DIAGNOSIS — M6281 Muscle weakness (generalized): Secondary | ICD-10-CM | POA: Diagnosis not present

## 2023-04-18 DIAGNOSIS — M23042 Cystic meniscus, anterior horn of lateral meniscus, left knee: Secondary | ICD-10-CM | POA: Diagnosis not present

## 2023-04-18 DIAGNOSIS — M25662 Stiffness of left knee, not elsewhere classified: Secondary | ICD-10-CM | POA: Diagnosis not present

## 2023-04-22 DIAGNOSIS — M23042 Cystic meniscus, anterior horn of lateral meniscus, left knee: Secondary | ICD-10-CM | POA: Diagnosis not present

## 2023-05-04 DIAGNOSIS — M6281 Muscle weakness (generalized): Secondary | ICD-10-CM | POA: Diagnosis not present

## 2023-05-04 DIAGNOSIS — S83222D Peripheral tear of medial meniscus, current injury, left knee, subsequent encounter: Secondary | ICD-10-CM | POA: Diagnosis not present

## 2023-05-04 DIAGNOSIS — R269 Unspecified abnormalities of gait and mobility: Secondary | ICD-10-CM | POA: Diagnosis not present

## 2023-05-04 DIAGNOSIS — M23042 Cystic meniscus, anterior horn of lateral meniscus, left knee: Secondary | ICD-10-CM | POA: Diagnosis not present

## 2023-05-04 DIAGNOSIS — M25662 Stiffness of left knee, not elsewhere classified: Secondary | ICD-10-CM | POA: Diagnosis not present

## 2023-05-05 ENCOUNTER — Other Ambulatory Visit: Payer: Self-pay

## 2023-06-06 ENCOUNTER — Other Ambulatory Visit (HOSPITAL_COMMUNITY): Payer: Self-pay

## 2023-06-08 DIAGNOSIS — S86812D Strain of other muscle(s) and tendon(s) at lower leg level, left leg, subsequent encounter: Secondary | ICD-10-CM | POA: Diagnosis not present

## 2023-06-15 DIAGNOSIS — S86812D Strain of other muscle(s) and tendon(s) at lower leg level, left leg, subsequent encounter: Secondary | ICD-10-CM | POA: Diagnosis not present

## 2023-06-24 DIAGNOSIS — S86812D Strain of other muscle(s) and tendon(s) at lower leg level, left leg, subsequent encounter: Secondary | ICD-10-CM | POA: Diagnosis not present

## 2023-07-11 ENCOUNTER — Other Ambulatory Visit (HOSPITAL_COMMUNITY): Payer: Self-pay

## 2023-07-11 ENCOUNTER — Other Ambulatory Visit: Payer: Self-pay

## 2023-07-11 ENCOUNTER — Telehealth: Payer: Self-pay

## 2023-07-11 DIAGNOSIS — Z1231 Encounter for screening mammogram for malignant neoplasm of breast: Secondary | ICD-10-CM | POA: Diagnosis not present

## 2023-07-11 DIAGNOSIS — J45909 Unspecified asthma, uncomplicated: Secondary | ICD-10-CM

## 2023-07-11 LAB — HM MAMMOGRAPHY

## 2023-07-11 MED ORDER — FLUTICASONE FUROATE-VILANTEROL 100-25 MCG/ACT IN AEPB
1.0000 | INHALATION_SPRAY | Freq: Every day | RESPIRATORY_TRACT | 1 refills | Status: DC
Start: 2023-07-11 — End: 2023-08-25
  Filled 2023-07-11: qty 60, 30d supply, fill #0
  Filled 2023-08-04: qty 60, 30d supply, fill #1

## 2023-07-11 NOTE — Telephone Encounter (Signed)
90 day sent 

## 2023-07-11 NOTE — Addendum Note (Signed)
Addended by: Tonny Bollman on: 07/11/2023 11:03 AM   Modules accepted: Orders

## 2023-07-11 NOTE — Telephone Encounter (Signed)
Prescription Request  07/11/2023  LOV: 02/23/23 Pt is requesting a 3 mo supply to get insurance discount with refills  What is the name of the medication or equipment?   fluticasone furoate-vilanterol (BREO ELLIPTA) 100-25 MCG/ACT AEPB    Have you contacted your pharmacy to request a refill? No   Which pharmacy would you like this sent to?   [facility] [address] [place] [address] Phone: [phone] Fax: [phone]    Patient notified that their request is being sent to the clinical staff for review and that they should receive a response within 2 business days.   Please advise at Mobile [phone] (mobile)

## 2023-07-12 ENCOUNTER — Encounter: Payer: Self-pay | Admitting: Family Medicine

## 2023-07-13 ENCOUNTER — Other Ambulatory Visit (HOSPITAL_COMMUNITY): Payer: Self-pay

## 2023-08-04 ENCOUNTER — Other Ambulatory Visit: Payer: Self-pay

## 2023-08-25 ENCOUNTER — Ambulatory Visit (INDEPENDENT_AMBULATORY_CARE_PROVIDER_SITE_OTHER): Payer: PPO | Admitting: Family Medicine

## 2023-08-25 ENCOUNTER — Other Ambulatory Visit: Payer: Self-pay

## 2023-08-25 ENCOUNTER — Other Ambulatory Visit (HOSPITAL_COMMUNITY): Payer: Self-pay

## 2023-08-25 ENCOUNTER — Encounter: Payer: Self-pay | Admitting: Family Medicine

## 2023-08-25 VITALS — BP 129/83 | HR 68 | Resp 18 | Ht 70.0 in | Wt 209.0 lb

## 2023-08-25 DIAGNOSIS — J45909 Unspecified asthma, uncomplicated: Secondary | ICD-10-CM

## 2023-08-25 DIAGNOSIS — R7303 Prediabetes: Secondary | ICD-10-CM

## 2023-08-25 DIAGNOSIS — E782 Mixed hyperlipidemia: Secondary | ICD-10-CM

## 2023-08-25 DIAGNOSIS — J45991 Cough variant asthma: Secondary | ICD-10-CM

## 2023-08-25 DIAGNOSIS — R7989 Other specified abnormal findings of blood chemistry: Secondary | ICD-10-CM | POA: Diagnosis not present

## 2023-08-25 MED ORDER — ROSUVASTATIN CALCIUM 5 MG PO TABS
5.0000 mg | ORAL_TABLET | ORAL | 3 refills | Status: DC
  Filled 2023-08-25: qty 90, 315d supply, fill #0
  Filled 2023-10-20: qty 24, 84d supply, fill #0
  Filled 2024-01-14: qty 24, 84d supply, fill #1
  Filled 2024-04-07: qty 24, 84d supply, fill #2
  Filled 2024-06-13: qty 24, 84d supply, fill #3
  Filled 2024-08-19: qty 24, 84d supply, fill #4

## 2023-08-25 MED ORDER — ALBUTEROL SULFATE HFA 108 (90 BASE) MCG/ACT IN AERS
INHALATION_SPRAY | RESPIRATORY_TRACT | 1 refills | Status: AC
  Filled 2023-08-25: qty 6.7, 17d supply, fill #0
  Filled 2024-08-19: qty 6.7, 17d supply, fill #1

## 2023-08-25 MED ORDER — MONTELUKAST SODIUM 10 MG PO TABS
10.0000 mg | ORAL_TABLET | Freq: Every day | ORAL | 3 refills | Status: DC
  Filled 2023-08-25: qty 90, 90d supply, fill #0
  Filled 2023-12-11: qty 90, 90d supply, fill #1
  Filled 2024-03-12: qty 90, 90d supply, fill #2
  Filled 2024-06-13: qty 90, 90d supply, fill #3

## 2023-08-25 NOTE — Progress Notes (Signed)
Established Patient Office Visit  Subjective   Patient ID: Cindy Rogers, female    DOB: [birth year]  Age: 72 y.o. MRN: [id number]  Chief Complaint  Patient presents with   Asthma   Hyperlipidemia    HPI Cindy Rogers is a 72 y.o. female presenting today for follow up of hyperlipidemia, asthma. Hyperlipidemia: tolerating rosuvastatin well with no myalgias or significant side effects.  She goes to several workout classes each week, golfs a couple of times each week, and stays active chasing after her 44 and 78-1/2-year-old grandchildren.  She has been making efforts to lose weight and has lost about 12 pounds over the past few months. The 10-year ASCVD risk score (Arnett DK, et al., 2019) is: 11.3% Asthma Follow-up: She presents for an asthma follow-up; she is not currently in exacerbation.  She is currently using Breo Ellipta 1 puff daily but would like to try coming off of it as her asthma has been well-controlled for quite some time.  She would also like to avoid using any inhaled powder in the future if she does need a different maintenance medication.  Outpatient Medications Prior to Visit  Medication Sig   calcium-vitamin D (OSCAL WITH D) 500-200 MG-UNIT per tablet Take 1 tablet by mouth daily.   cholecalciferol (VITAMIN D) 1000 UNITS tablet Take 2,000 Units by mouth daily.    esomeprazole (NEXIUM) 20 MG capsule Take 20 mg by mouth daily at 12 noon.   famotidine (PEPCID) 20 MG tablet Take by mouth one at bedtime   fluticasone (FLONASE) 50 MCG/ACT nasal spray Place 2 sprays into the nose daily as needed. For allergies   ibuprofen (ADVIL,MOTRIN) 200 MG tablet Take 600 mg by mouth every 6 (six) hours as needed. For pain   naproxen (NAPROSYN) 500 MG tablet Take 500 mg by mouth as needed.   polyethylene glycol (MIRALAX / GLYCOLAX) 17 g packet Take 17 g by mouth daily. 2-3 times weekly   [DISCONTINUED] albuterol (PROAIR HFA) 108 (90 Base) MCG/ACT inhaler INHALE 2 PUFFS EVERY 4 HOURS AS  NEEDED ONLY IF YOU CAN'T CATCH YOUR BREATH.   [DISCONTINUED] fluticasone furoate-vilanterol (BREO ELLIPTA) 100-25 MCG/ACT AEPB Inhale 1 puff into the lungs daily.   [DISCONTINUED] guaiFENesin (MUCINEX) 600 MG 12 hr tablet Take by mouth 2 (two) times daily as needed.   [DISCONTINUED] montelukast (SINGULAIR) 10 MG tablet Take 1 tablet (10 mg total) by mouth at bedtime.   [DISCONTINUED] nystatin (MYCOSTATIN) 100000 UNIT/ML suspension Take 5 mLs (500,000 Units total) by mouth 3 (three) times daily.   [DISCONTINUED] rosuvastatin (CRESTOR) 5 MG tablet Take 1 tablet (5 mg total) by mouth 2 (two) times a week.   No facility-administered medications prior to visit.    ROS Negative unless otherwise noted in HPI   Objective:     BP 129/83 (BP Location: Left Arm, Patient Position: Sitting, Cuff Size: Normal)   Pulse 68   Resp 18   Ht 5\' 10"  (1.778 m)   Wt 209 lb (94.8 kg)   SpO2 99%   BMI 29.99 kg/m   Physical Exam Constitutional:      General: She is not in acute distress.    Appearance: Normal appearance.  HENT:     Head: Normocephalic and atraumatic.  Cardiovascular:     Rate and Rhythm: Normal rate and regular rhythm.     Heart sounds: No murmur heard.    No friction rub. No gallop.  Pulmonary:     Effort: Pulmonary effort is normal.  No respiratory distress.     Breath sounds: No wheezing, rhonchi or rales.  Skin:    General: Skin is warm and dry.  Neurological:     Mental Status: She is alert and oriented to person, place, and time.     Assessment & Plan:  Mixed hyperlipidemia Assessment & Plan: Last lipid panel: LDL 94, HDL 66, triglycerides 78.  Rechecking lipid panel and hepatic function today.  Continue rosuvastatin 5 mg unless lab results warrant change.  Orders: -     Lipid panel; Future -     Hepatic function panel; Future -     Rosuvastatin Calcium; Take 1 tablet (5 mg total) by mouth 2 (two) times a week.  Dispense: 90 tablet; Refill: 3  Cough variant asthma -      Albuterol Sulfate HFA; INHALE 2 PUFFS EVERY 4 HOURS AS NEEDED ONLY IF YOU CAN'T CATCH YOUR BREATH.  Dispense: 8.5 g; Refill: 1 -     Montelukast Sodium; Take 1 tablet (10 mg total) by mouth at bedtime.  Dispense: 90 tablet; Refill: 3  Intrinsic asthma Assessment & Plan: No adventitious lung sounds on exam.  Trial of discontinuing Breo Ellipta daily inhaler, refilling albuterol rescue inhaler.  Continue montelukast 10 mg daily.  If after 1 week of discontinuing maintenance inhaler symptoms begin to return, we may need to try a different nonpowder rescue inhaler.  Patient verbalized understanding and is agreeable to this plan.  Orders: -     Albuterol Sulfate HFA; INHALE 2 PUFFS EVERY 4 HOURS AS NEEDED ONLY IF YOU CAN'T CATCH YOUR BREATH.  Dispense: 8.5 g; Refill: 1 -     Montelukast Sodium; Take 1 tablet (10 mg total) by mouth at bedtime.  Dispense: 90 tablet; Refill: 3  Elevated TSH -     TSH Rfx on Abnormal to Free T4; Future  Prediabetes -     Hemoglobin A1c; Future    Return in about 6 months (around 02/23/2024) for annual physical, fasting blood work 1 week before.    Melida Quitter, PA

## 2023-08-25 NOTE — Patient Instructions (Signed)
Try coming off of the Breo inhaler for the next week or so.  Please let me know if you do have any issues and need to restart it, otherwise we will plan on just keeping your rescue inhaler prescription up-to-date!  The lab results will be in MyChart once they have resulted.  If you do not see a note from me attached to them, check back in 1-2 days.

## 2023-08-25 NOTE — Assessment & Plan Note (Signed)
Last lipid panel: LDL 94, HDL 66, triglycerides 78.  Rechecking lipid panel and hepatic function today.  Continue rosuvastatin 5 mg unless lab results warrant change.

## 2023-08-25 NOTE — Assessment & Plan Note (Signed)
No adventitious lung sounds on exam.  Trial of discontinuing Breo Ellipta daily inhaler, refilling albuterol rescue inhaler.  Continue montelukast 10 mg daily.  If after 1 week of discontinuing maintenance inhaler symptoms begin to return, we may need to try a different nonpowder rescue inhaler.  Patient verbalized understanding and is agreeable to this plan.

## 2023-08-26 LAB — TSH RFX ON ABNORMAL TO FREE T4: TSH: 3.1 u[IU]/mL (ref 0.450–4.500)

## 2023-08-26 LAB — HEPATIC FUNCTION PANEL
ALT: 13 [IU]/L (ref 0–32)
AST: 14 [IU]/L (ref 0–40)
Albumin: 4.3 g/dL (ref 3.8–4.8)
Alkaline Phosphatase: 67 [IU]/L (ref 44–121)
Bilirubin Total: 0.6 mg/dL (ref 0.0–1.2)
Bilirubin, Direct: 0.2 mg/dL (ref 0.00–0.40)
Total Protein: 6.8 g/dL (ref 6.0–8.5)

## 2023-08-26 LAB — HEMOGLOBIN A1C
Est. average glucose Bld gHb Est-mCnc: 114 mg/dL
Hgb A1c MFr Bld: 5.6 % (ref 4.8–5.6)

## 2023-08-26 LAB — LIPID PANEL
Chol/HDL Ratio: 3 {ratio} (ref 0.0–4.4)
Cholesterol, Total: 175 mg/dL (ref 100–199)
HDL: 59 mg/dL (ref >39)
LDL Chol Calc (NIH): 101 mg/dL — ABNORMAL HIGH (ref 0–99)
Triglycerides: 78 mg/dL (ref 0–149)
VLDL Cholesterol Cal: 15 mg/dL (ref 5–40)

## 2023-08-30 ENCOUNTER — Ambulatory Visit: Payer: PPO

## 2023-08-30 DIAGNOSIS — Z0000 Encounter for general adult medical examination without abnormal findings: Secondary | ICD-10-CM

## 2023-08-30 NOTE — Patient Instructions (Signed)
Cindy Rogers , Thank you for taking time to come for your Medicare Wellness Visit. I appreciate your ongoing commitment to your health goals. Please review the following plan we discussed and let me know if I can assist you in the future.   Referrals/Orders/Follow-Ups/Clinician Recommendations: none  This is a list of the screening recommended for you and due dates:  Health Maintenance  Topic Date Due   COVID-19 Vaccine (7 - 2024-25 season) 09/10/2023*   Medicare Annual Wellness Visit  08/29/2024   Mammogram  07/10/2025   DTaP/Tdap/Td vaccine (3 - Td or Tdap) 09/11/2028   Colon Cancer Screening  10/12/2032   Pneumonia Vaccine  Completed   Flu Shot  Completed   DEXA scan (bone density measurement)  Completed   Hepatitis C Screening  Completed   Zoster (Shingles) Vaccine  Completed   HPV Vaccine  Aged Out  *Topic was postponed. The date shown is not the original due date.    Advanced directives: (Copy Requested) Please bring a copy of your health care power of attorney and living will to the office to be added to your chart at your convenience.  Next Medicare Annual Wellness Visit scheduled for next year: Yes  Insert Preventive Care attachment Insert FALL PREVENTION attachment if needed

## 2023-08-30 NOTE — Progress Notes (Signed)
Subjective:   Cindy Rogers is a 72 y.o. female who presents for Medicare Annual (Subsequent) preventive examination.  Visit Complete: Virtual I connected with  Earney Hamburg on 08/30/23 by a audio enabled telemedicine application and verified that I am speaking with the correct person using two identifiers.  Patient Location: Home  Provider Location: Office/Clinic  I discussed the limitations of evaluation and management by telemedicine. The patient expressed understanding and agreed to proceed.  Vital Signs: Because this visit was a virtual/telehealth visit, some criteria may be missing or patient reported. Any vitals not documented were not able to be obtained and vitals that have been documented are patient reported.  Patient Medicare AWV questionnaire was completed by the patient on 08/24/2023; I have confirmed that all information answered by patient is correct and no changes since this date.  Cardiac Risk Factors include: advanced age (>22men, >1 women);dyslipidemia     Objective:    Today's Vitals   There is no height or weight on file to calculate BMI.     08/30/2023   10:45 AM 09/30/2018    8:14 PM 01/26/2017    1:09 PM 12/10/2011    4:32 AM  Advanced Directives  Does Patient Have a Medical Advance Directive? Yes No Yes Patient has advance directive, copy not in chart  Type of Advance Directive Healthcare Power of [place];Living will   Living will  Copy of Healthcare Power of Attorney in Chart? No - copy requested     Would patient like information on creating a medical advance directive?  No - Patient declined      Current Medications (verified) Outpatient Encounter Medications as of 08/30/2023  Medication Sig   albuterol (PROAIR HFA) 108 (90 Base) MCG/ACT inhaler INHALE 2 PUFFS EVERY 4 HOURS AS NEEDED ONLY IF YOU CAN'T CATCH YOUR BREATH.   calcium-vitamin D (OSCAL WITH D) 500-200 MG-UNIT per tablet Take 1 tablet by mouth daily.   cholecalciferol (VITAMIN D)  1000 UNITS tablet Take 2,000 Units by mouth daily.    esomeprazole (NEXIUM) 20 MG capsule Take 20 mg by mouth daily at 12 noon.   famotidine (PEPCID) 20 MG tablet Take by mouth one at bedtime   fluticasone (FLONASE) 50 MCG/ACT nasal spray Place 2 sprays into the nose daily as needed. For allergies   ibuprofen (ADVIL,MOTRIN) 200 MG tablet Take 600 mg by mouth every 6 (six) hours as needed. For pain   montelukast (SINGULAIR) 10 MG tablet Take 1 tablet (10 mg total) by mouth at bedtime.   naproxen (NAPROSYN) 500 MG tablet Take 500 mg by mouth as needed.   polyethylene glycol (MIRALAX / GLYCOLAX) 17 g packet Take 17 g by mouth daily. 2-3 times weekly   rosuvastatin (CRESTOR) 5 MG tablet Take 1 tablet (5 mg total) by mouth 2 (two) times a week.   No facility-administered encounter medications on file as of 08/30/2023.    Allergies (verified) Food, Shellfish allergy, Codeine, Penicillins, Sulfa antibiotics, and Neosporin af [miconazole]   History: Past Medical History:  Diagnosis Date   Asthma    Asthma    Phreesia 07/15/2020   Bronchitis 01/26/2017   GERD (gastroesophageal reflux disease)    Headache(784.0)    Pneumonia    PONV (postoperative nausea and vomiting)    Shortness of breath    Past Surgical History:  Procedure Laterality Date   ABDOMINAL HYSTERECTOMY     CHOLECYSTECTOMY     KNEE SURGERY Left    03/2023 meniscus   RHINOPLASTY  Family History  Problem Relation Age of Onset   Emphysema Father        smoked   Heart disease Father    Kidney cancer Father        with mets to liver and retina   Heart disease Mother    Social History   Socioeconomic History   Marital status: Married    Spouse name: Not on file   Number of children: Not on file   Years of education: Not on file   Highest education level: Not on file  Occupational History   Occupation: Retired Administrator, arts: [employer]  Tobacco Use   Smoking status: Never    Passive exposure:  Never   Smokeless tobacco: Never  Vaping Use   Vaping status: Never Used  Substance and Sexual Activity   Alcohol use: Yes    Comment: glass of wine approx twice per wk   Drug use: No   Sexual activity: Yes    Birth control/protection: Post-menopausal  Other Topics Concern   Not on file  Social History Narrative   Not on file   Social Drivers of Health   Financial Resource Strain: Low Risk  (08/24/2023)   Overall Financial Resource Strain (CARDIA)    Difficulty of Paying Living Expenses: Not hard at all  Food Insecurity: No Food Insecurity (08/24/2023)   Hunger Vital Sign    Worried About Running Out of Food in the Last Year: Never true    Ran Out of Food in the Last Year: Never true  Transportation Needs: No Transportation Needs (08/24/2023)   PRAPARE - Administrator, Civil Service (Medical): No    Lack of Transportation (Non-Medical): No  Physical Activity: Sufficiently Active (08/24/2023)   Exercise Vital Sign    Days of Exercise per Week: 4 days    Minutes of Exercise per Session: 60 min  Stress: Stress Concern Present (08/24/2023)   [employer] of Occupational Health - Occupational Stress Questionnaire    Feeling of Stress : To some extent  Social Connections: Unknown (08/24/2023)   Social Connection and Isolation Panel [NHANES]    Frequency of Communication with Friends and Family: More than three times a week    Frequency of Social Gatherings with Friends and Family: More than three times a week    Attends Religious Services: Not on Marketing executive or Organizations: Yes    Attends Engineer, structural: More than 4 times per year    Marital Status: Married    Tobacco Counseling Counseling given: Not Answered   Clinical Intake:  Pre-visit preparation completed: Yes  Pain : No/denies pain     Nutritional Risks: None Diabetes: No  How often do you need to have someone help you when you read instructions,  pamphlets, or other written materials from your doctor or pharmacy?: 1 - Never  Interpreter Needed?: No  Information entered by :: NAllen LPN   Activities of Daily Living    08/24/2023   11:10 PM  In your present state of health, do you have any difficulty performing the following activities:  Hearing? 0  Vision? 0  Difficulty concentrating or making decisions? 0  Walking or climbing stairs? 0  Dressing or bathing? 0  Doing errands, shopping? 0  Preparing Food and eating ? N  Using the Toilet? N  In the past six months, have you accidently leaked urine? N  Do you have problems with  loss of bowel control? N  Managing your Medications? N  Managing your Finances? N  Housekeeping or managing your Housekeeping? N    Patient Care Team: Melida Quitter, PA as PCP - General (Family Medicine) Nyoka Cowden, MD as Consulting Physician (Pulmonary Disease) Pati Gallo, MD as Consulting Physician (Sports Medicine) Bufford Buttner, MD as Consulting Physician (Dermatology) Mia Creek, MD as Consulting Physician (Ophthalmology)  Indicate any recent Medical Services you may have received from other than [facility] providers in the past year (date may be approximate).     Assessment:   This is a routine wellness examination for [place].  Hearing/Vision screen Hearing Screening - Comments:: Denies hearing issues Vision Screening - Comments:: Regular eye exams, [facility], Dr. Fredrich Birks   Goals Addressed             This Visit's Progress    Patient Stated       08/30/2023, continue to lose weight       Depression Screen    08/30/2023   10:46 AM 08/25/2023    8:42 AM 02/23/2023    9:34 AM 08/24/2022    2:02 PM 05/11/2022    2:50 PM 02/15/2022    8:27 AM 02/01/2022    9:28 AM  PHQ 2/9 Scores  PHQ - 2 Score 0 0 0 0 0 0 0  PHQ- 9 Score 1 1 0 1 1  2     Fall Risk    08/24/2023   11:10 PM 05/11/2022    2:50 PM 02/15/2022    8:26 AM 02/01/2022    9:28 AM  08/17/2021    9:48 AM  Fall Risk   Falls in the past year? 0 0 0 0 0  Number falls in past yr: 0 0 0 0 0  Injury with Fall? 0 0 0 0 0  Risk for fall due to : Medication side effect No Fall Risks No Fall Risks No Fall Risks No Fall Risks  Follow up Falls prevention discussed;Falls evaluation completed Falls evaluation completed Falls evaluation completed Falls evaluation completed Falls evaluation completed    MEDICARE RISK AT HOME: Medicare Risk at Home Any stairs in or around the home?: Yes If so, are there any without handrails?: No Home free of loose throw rugs in walkways, pet beds, electrical cords, etc?: Yes Adequate lighting in your home to reduce risk of falls?: Yes Life alert?: No Use of a cane, walker or w/c?: No Grab bars in the bathroom?: Yes Shower chair or bench in shower?: No Elevated toilet seat or a handicapped toilet?: Yes  TIMED UP AND GO:  Was the test performed?  No    Cognitive Function:        08/30/2023   10:47 AM 08/24/2022    2:05 PM 08/17/2021    9:38 AM 07/16/2020    8:51 AM 01/01/2019   11:08 AM  6CIT Screen  What Year? 0 points 0 points 0 points 0 points 0 points  What month? 0 points 0 points 0 points 0 points 0 points  What time? 0 points 0 points 0 points 0 points 0 points  Count back from 20 0 points 0 points 0 points 0 points 0 points  Months in reverse 0 points 0 points 0 points 0 points 0 points  Repeat phrase 0 points 0 points 0 points 0 points 2 points  Total Score 0 points 0 points 0 points 0 points 2 points    Immunizations Immunization History  Administered Date(s)  Administered   Fluad Quad(high Dose 65+) 05/31/2019, 06/18/2021, 05/31/2023   H1N1 10/01/2008   Influenza Split 06/13/2013, 06/13/2014   Influenza Whole 06/13/2016   Influenza, High Dose Seasonal PF 06/22/2018, 06/23/2022   Influenza-Unspecified 05/26/2017, 06/23/2020, 06/23/2022   PFIZER(Purple Top)SARS-COV-2 Vaccination 10/19/2019, 11/13/2019, 07/05/2020,  04/06/2021, 09/16/2021   PNEUMOCOCCAL CONJUGATE-20 06/07/2023   Pfizer Covid-19 Vaccine Bivalent Booster 8yrs & up 08/22/2022   Pneumococcal Conjugate-13 02/04/2016   Pneumococcal Polysaccharide-23 04/21/2011   RSV,unspecified 08/10/2022   Respiratory Syncytial Virus Vaccine,Recomb Aduvanted(Arexvy) 08/10/2022   Tdap 03/27/2009, 09/11/2018   Zoster Recombinant(Shingrix) 03/08/2017, 05/26/2017   Zoster, Live 08/02/2011    TDAP status: Up to date  Flu Vaccine status: Up to date  Pneumococcal vaccine status: Up to date  Covid-19 vaccine status: Information provided on how to obtain vaccines.   Qualifies for Shingles Vaccine? Yes   Zostavax completed Yes   Shingrix Completed?: Yes  Screening Tests Health Maintenance  Topic Date Due   COVID-19 Vaccine (7 - 2024-25 season) 09/10/2023 (Originally 05/15/2023)   Medicare Annual Wellness (AWV)  08/29/2024   MAMMOGRAM  07/10/2025   DTaP/Tdap/Td (3 - Td or Tdap) 09/11/2028   Colonoscopy  10/12/2032   Pneumonia Vaccine 17+ Years old  Completed   INFLUENZA VACCINE  Completed   DEXA SCAN  Completed   Hepatitis C Screening  Completed   Zoster Vaccines- Shingrix  Completed   HPV VACCINES  Aged Out    Health Maintenance  There are no preventive care reminders to display for this patient.  Colorectal cancer screening: Type of screening: Colonoscopy. Completed 10/12/2022. Repeat every 5 years  Mammogram status: Completed 07/11/2023. Repeat every year  Bone Density status: Completed 06/19/2018.   Lung Cancer Screening: (Low Dose CT Chest recommended if Age 9-80 years, 20 pack-year currently smoking OR have quit w/in 15years.) does not qualify.   Lung Cancer Screening Referral: no  Additional Screening:  Hepatitis C Screening: does qualify; Completed 03/02/2017  Vision Screening: Recommended annual ophthalmology exams for early detection of glaucoma and other disorders of the eye. Is the patient up to date with their annual eye  exam?  Yes  Who is the provider or what is the name of the office in which the patient attends annual eye exams? Dr. Lorin Picket If pt is not established with a provider, would they like to be referred to a provider to establish care? No .   Dental Screening: Recommended annual dental exams for proper oral hygiene  Diabetic Foot Exam: n/a  Community Resource Referral / Chronic Care Management: CRR required this visit?  No   CCM required this visit?  No     Plan:     I have personally reviewed and noted the following in the patient's chart:   Medical and social history Use of alcohol, tobacco or illicit drugs  Current medications and supplements including opioid prescriptions. Patient is not currently taking opioid prescriptions. Functional ability and status Nutritional status Physical activity Advanced directives List of other physicians Hospitalizations, surgeries, and ER visits in previous 12 months Vitals Screenings to include cognitive, depression, and falls Referrals and appointments  In addition, I have reviewed and discussed with patient certain preventive protocols, quality metrics, and best practice recommendations. A written personalized care plan for preventive services as well as general preventive health recommendations were provided to patient.     SAGEN VOILS, LPN   09/15/7251   After Visit Summary: (MyChart) Due to this being a telephonic visit, the after visit summary with patients  personalized plan was offered to patient via MyChart   Nurse Notes: none

## 2023-10-06 ENCOUNTER — Ambulatory Visit (INDEPENDENT_AMBULATORY_CARE_PROVIDER_SITE_OTHER): Payer: PPO | Admitting: Family Medicine

## 2023-10-06 ENCOUNTER — Encounter: Payer: Self-pay | Admitting: Family Medicine

## 2023-10-06 VITALS — BP 131/82 | HR 70 | Ht 70.0 in | Wt 213.1 lb

## 2023-10-06 DIAGNOSIS — N898 Other specified noninflammatory disorders of vagina: Secondary | ICD-10-CM

## 2023-10-06 DIAGNOSIS — R300 Dysuria: Secondary | ICD-10-CM | POA: Diagnosis not present

## 2023-10-06 LAB — POCT URINALYSIS DIP (CLINITEK)
Bilirubin, UA: NEGATIVE
Blood, UA: NEGATIVE
Glucose, UA: NEGATIVE mg/dL
Ketones, POC UA: NEGATIVE mg/dL
Nitrite, UA: NEGATIVE
POC PROTEIN,UA: NEGATIVE
Spec Grav, UA: 1.025 (ref 1.010–1.025)
Urobilinogen, UA: 0.2 U/dL
pH, UA: 6 (ref 5.0–8.0)

## 2023-10-06 NOTE — Assessment & Plan Note (Signed)
1 week of symptoms including odor, itching, dysuria, urgency.  Urine dipstick only showed small leukocyte Estrace, otherwise normal.  Will send off for culture.  Wet prep also ordered due to her complaints of itching.  Will hold off on antibiotics pending lab results.

## 2023-10-06 NOTE — Progress Notes (Signed)
   Acute Office Visit  Subjective:     Patient ID: Cindy Rogers, female    DOB: July 01, 1951, 73 y.o.   MRN: [id number]  Chief Complaint  Patient presents with   Urinary Tract Infection    HPI Patient is in today for complaints of possible UTI.  Patient states that since last Thursday she has had pressure in the suprapubic area, most notably with urination.  Strong odor.  Some vaginal itching, burning after urination.  Increased urgency but not increased frequency.  Denies any vaginal discharge.  Is sexually active with the same long-term partner.  Has never had a UTI before but has had occasional yeast infections.   ROS      Objective:    BP 131/82   Pulse 70   Ht 5\' 10"  (1.778 m)   Wt 213 lb 1.9 oz (96.7 kg)   SpO2 100%   BMI 30.58 kg/m    Physical Exam General: Alert, oriented CV: Rate rhythm Pulmonary: Lungs clear bilaterally GU: Normal-appearing labia.  No discharge noted.  Results for orders placed or performed in visit on 10/06/23  POCT URINALYSIS DIP (CLINITEK)  Result Value Ref Range   Color, UA yellow yellow   Clarity, UA clear clear   Glucose, UA negative negative mg/dL   Bilirubin, UA negative negative   Ketones, POC UA negative negative mg/dL   Spec Grav, UA 3.244 0.102 - 1.025   Blood, UA negative negative   pH, UA 6.0 5.0 - 8.0   POC PROTEIN,UA negative negative, trace   Urobilinogen, UA 0.2 0.2 or 1.0 E.U./dL   Nitrite, UA Negative Negative   Leukocytes, UA Small (1+) (A) Negative        Assessment & Plan:   Dysuria Assessment & Plan: 1 week of symptoms including odor, itching, dysuria, urgency.  Urine dipstick only showed small leukocyte Estrace, otherwise normal.  Will send off for culture.  Wet prep also ordered due to her complaints of itching.  Will hold off on antibiotics pending lab results.   Vaginal itching -     POCT URINALYSIS DIP (CLINITEK) -     WET PREP FOR TRICH, YEAST, CLUE -     Urine Culture; Future     Return if  symptoms worsen or fail to improve.  Sandre Kitty, MD

## 2023-10-06 NOTE — Patient Instructions (Signed)
It was nice to see you today,  We addressed the following topics today: -I am sending off your urine for culture and I am sending in the swab to test for BV and yeast infection. - Depending on results I will either send in an antifungal or a antibiotic. - If you develop any signs of systemic infection like fever chills nausea vomiting please seek medical attention either urgent care or call the nurse triage line  Have a great day,  Frederic Jericho, MD

## 2023-10-07 ENCOUNTER — Telehealth: Payer: Self-pay | Admitting: *Deleted

## 2023-10-07 LAB — SPECIMEN STATUS REPORT

## 2023-10-07 NOTE — Addendum Note (Signed)
Addended by: Lamonte Sakai, Adriene Knipfer D on: 10/07/2023 01:32 PM   Modules accepted: Orders

## 2023-10-07 NOTE — Telephone Encounter (Signed)
Contacted [facility] and changed the order

## 2023-10-07 NOTE — Telephone Encounter (Signed)
Copied from CRM [id number]. Topic: General - Other >> Oct 07, 2023  1:12 PM Fonda Kinder J wrote: Reason for CRM: [employer] said they received the wrong sample for the pt and is requesting a callback at [phone] to have this corrected

## 2023-10-08 LAB — WET PREP FOR TRICH, YEAST, CLUE

## 2023-10-09 ENCOUNTER — Other Ambulatory Visit: Payer: Self-pay | Admitting: Family Medicine

## 2023-10-09 ENCOUNTER — Encounter: Payer: Self-pay | Admitting: Family Medicine

## 2023-10-09 LAB — URINE CULTURE

## 2023-10-09 MED ORDER — AMOXICILLIN 500 MG PO CAPS: 500.0000 mg | ORAL_CAPSULE | Freq: Two times a day (BID) | ORAL | 0 refills | Status: AC

## 2023-10-20 ENCOUNTER — Other Ambulatory Visit (HOSPITAL_COMMUNITY): Payer: Self-pay

## 2023-10-20 ENCOUNTER — Encounter: Payer: Self-pay | Admitting: Family Medicine

## 2023-10-24 LAB — NUSWAB VG+, CANDIDA 6SP
C PARAPSILOSIS/TROPICALIS: NEGATIVE
Candida albicans, NAA: NEGATIVE
Candida glabrata, NAA: NEGATIVE
Candida krusei, NAA: NEGATIVE
Candida lusitaniae, NAA: NEGATIVE
Chlamydia trachomatis, NAA: NEGATIVE
Neisseria gonorrhoeae, NAA: NEGATIVE
Trich vag by NAA: NEGATIVE

## 2023-10-24 LAB — SPECIMEN STATUS REPORT

## 2023-10-31 ENCOUNTER — Ambulatory Visit (INDEPENDENT_AMBULATORY_CARE_PROVIDER_SITE_OTHER): Payer: PPO | Admitting: Family Medicine

## 2023-10-31 VITALS — BP 111/75 | HR 87 | Temp 100.2°F | Ht 70.0 in | Wt 210.1 lb

## 2023-10-31 DIAGNOSIS — U071 COVID-19: Secondary | ICD-10-CM | POA: Diagnosis not present

## 2023-10-31 LAB — POC SOFIA 2 FLU + SARS ANTIGEN FIA
Influenza A, POC: NEGATIVE
Influenza B, POC: NEGATIVE
SARS Coronavirus 2 Ag: POSITIVE — AB

## 2023-10-31 MED ORDER — PREDNISONE 20 MG PO TABS: 40.0000 mg | ORAL_TABLET | Freq: Every day | ORAL | 0 refills | Status: AC

## 2023-10-31 MED ORDER — AZITHROMYCIN 250 MG PO TABS: ORAL_TABLET | ORAL | 0 refills | Status: AC

## 2023-10-31 NOTE — Progress Notes (Signed)
  Established Patient Office Visit  Subjective   Patient ID: Cindy Rogers, female    DOB: Oct 27, 1950  Age: 73 y.o. MRN: [id number]  Chief Complaint  Patient presents with   Sore Throat    HPI Patient states her symptoms started Saturday with tightness in her chest.  Patient feels like she has to cough but cannot.  Also complaining of headache, no bodyaches.  Complaining of scratchy throat.  No nausea vomiting diarrhea or shortness of breath..  Had some chills last night.  Had a temperature of 100.5 this morning.  Has not been around anybody that has been sick as far she knows.  Patient had the flu vaccine this year.  Patient treated with amoxicillin within the past month for a UTI.  Patient has a history of asthma.  She no longer uses a daily maintenance Hailer but does use albuterol as needed.  Patient is leaving for the [place] on Friday.  The 10-year ASCVD risk score (Arnett DK, et al., 2019) is: 8.7%  Health Maintenance Due  Topic Date Due   COVID-19 Vaccine (7 - 2024-25 season) 05/15/2023      Objective:     BP 111/75   Pulse 87   Temp 100.2 F (37.9 C)   Ht 5\' 10"  (1.778 m)   Wt 210 lb 1.9 oz (95.3 kg)   SpO2 98%   BMI 30.15 kg/m    Physical Exam General: Alert, oriented CV: Regular rate rhythm no murmurs Pulmonary: Lungs clear bilaterally no wheeze or crackles.   No results found for any visits on 10/31/23.      Assessment & Plan:   COVID Assessment & Plan: COVID-positive.  Discussed symptomatic management with patient.  Prescribing azithromycin and prednisone due to her asthma and possibility of asthma exacerbation triggered by her COVID infection.  Discussed isolation precautions.   Other orders -     predniSONE; Take 2 tablets (40 mg total) by mouth daily with breakfast for 5 days.  Dispense: 10 tablet; Refill: 0 -     Azithromycin; Take 2 tablets on day 1, then 1 tablet daily on days 2 through 5  Dispense: 6 tablet; Refill: 0     Return if  symptoms worsen or fail to improve.    Sandre Kitty, MD

## 2023-10-31 NOTE — Patient Instructions (Signed)
It was nice to see you today,  We addressed the following topics today: -You tested positive for COVID.  I would recommend trying to isolate yourself from others for 5 days after the onset of symptoms which started on Saturday.  This would put you through till Wednesday.  After Wednesday I would wear a mask for an additional 5 days.  Please wash your hands frequently especially when coming into contact with others. - I have sent in azithromycin and prednisone for 5 days in case the COVID is aggravating your asthma and causing an exacerbation.  Have a great day,  Frederic Jericho, MD

## 2023-10-31 NOTE — Assessment & Plan Note (Signed)
COVID-positive.  Discussed symptomatic management with patient.  Prescribing azithromycin and prednisone due to her asthma and possibility of asthma exacerbation triggered by her COVID infection.  Discussed isolation precautions.

## 2023-12-12 ENCOUNTER — Other Ambulatory Visit (HOSPITAL_COMMUNITY): Payer: Self-pay

## 2023-12-19 ENCOUNTER — Ambulatory Visit (INDEPENDENT_AMBULATORY_CARE_PROVIDER_SITE_OTHER): Admitting: Internal Medicine

## 2023-12-19 ENCOUNTER — Encounter: Payer: Self-pay | Admitting: Internal Medicine

## 2023-12-19 ENCOUNTER — Ambulatory Visit: Payer: Self-pay | Admitting: Family Medicine

## 2023-12-19 VITALS — BP 122/77 | HR 77 | Temp 98.7°F

## 2023-12-19 DIAGNOSIS — R600 Localized edema: Secondary | ICD-10-CM | POA: Diagnosis not present

## 2023-12-19 MED ORDER — TRIAMTERENE-HCTZ 75-50 MG PO TABS: ORAL_TABLET | ORAL | 1 refills | Status: DC

## 2023-12-19 NOTE — Telephone Encounter (Signed)
 Copied from CRM [id number]. Topic: Clinical - Red Word Triage >> Dec 19, 2023 10:34 AM Elle L wrote: Red Word that prompted transfer to Nurse Triage: The patient is having swelling in her legs and hands off and on for a month. It started while she was on a plane trip and came back while she was on a long car ride over the weekend.   Chief Complaint: leg swelling Symptoms: swelling from ankle to calf Frequency: intermittent Pertinent Negatives: Patient denies fever or shortness of breath Disposition: [] ED /[] Urgent Care (no appt availability in office) / [x] Appointment(In office/virtual)/ []  [facility]/ [] Home Care/ [] Refused Recommended Disposition /[] [facility]/ []  Follow-up with PCP Additional Notes: Reports swelling after plane ride and car trip. No avail appts at [facility], scheduled at [facility] today at 2pm Reason for Disposition  [1] MODERATE leg swelling (e.g., swelling extends up to knees) AND [2] new-onset or worsening  Answer Assessment - Initial Assessment Questions 1. ONSET: "When did the swelling start?" (e.g., minutes, hours, days)     Started after plane trip one month ago and noticed again after long car trip  2. LOCATION: "What part of the leg is swollen?"  "Are both legs swollen or just one leg?"     Both ankles and calves  3. SEVERITY: "How bad is the swelling?" (e.g., localized; mild, moderate, severe)   - Localized: Small area of swelling localized to one leg.   - MILD pedal edema: Swelling limited to foot and ankle, pitting edema < 1/4 inch (6 mm) deep, rest and elevation eliminate most or all swelling.   - MODERATE edema: Swelling of lower leg to knee, pitting edema > 1/4 inch (6 mm) deep, rest and elevation only partially reduce swelling.   - SEVERE edema: Swelling extends above knee, facial or hand swelling present.      Mild to moderate  4. REDNESS: "Does the swelling look red or infected?"     In one spot on each ankle  5. PAIN: "Is the swelling  painful to touch?" If Yes, ask: "How painful is it?"   (Scale 1-10; mild, moderate or severe)     Not painful at this moment, but was painful to walk before  6. FEVER: "Do you have a fever?" If Yes, ask: "What is it, how was it measured, and when did it start?"      No  7. CAUSE: "What do you think is causing the leg swelling?"     Unsure of cause, happened before after long plan trip but resolved quicly  8. MEDICAL HISTORY: "Do you have a history of blood clots (e.g., DVT), cancer, heart failure, kidney disease, or liver failure?"     *No Answer* 9. RECURRENT SYMPTOM: "Have you had leg swelling before?" If Yes, ask: "When was the last time?" "What happened that time?"     *No Answer* 10. OTHER SYMPTOMS: "Do you have any other symptoms?" (e.g., chest pain, difficulty breathing)       Hands feel stiff 11. PREGNANCY: "Is there any chance you are pregnant?" "When was your last menstrual period?"       *No Answer*  Protocols used: Leg Swelling and Edema-A-AH

## 2023-12-19 NOTE — Progress Notes (Signed)
 Patient Care Team: Melida Quitter, PA as PCP - General (Family Medicine) Nyoka Cowden, MD as Consulting Physician (Pulmonary Disease) Pati Gallo, MD as Consulting Physician (Sports Medicine) Bufford Buttner, MD as Consulting Physician (Dermatology) Mia Creek, MD as Consulting Physician (Ophthalmology)  Visit Date: 12/19/23  Subjective:   Chief Complaint  Patient presents with   Leg Swelling  Patient EX:BMWUX L Cindy Rogers,Female DOB:1951-08-27,72 y.o. LKG:[id number]   73 y.o. Female presents today for acute  visit for Bilateral LE Edema. Blood pressure is normal today at 122/77. She says that about 1 month ago she was on a plane for 7 hours to the [place] and afterwards her right leg was swollen.  She notes that while her left leg does swell mildly, especially since breaking her left leg 10+ years ago it had been more swollen.  Then on Saturday she was in the car for 10 hours and her legs swelled again. Since then she has played golf and noticed swelling afterwards. Says that the only other time she experienced something similar was after being on a 14 hours flight  some 10 years ago. Denies Aleve and Advil use during extended trips, but has bought some compression stockings to wear on her next flight in 2 weeks.  Past Medical History:  Diagnosis Date   Asthma    Asthma    Phreesia 07/15/2020   Bronchitis 01/26/2017   GERD (gastroesophageal reflux disease)    Headache(784.0)    Pneumonia    PONV (postoperative nausea and vomiting)    Shortness of breath     Allergies  Allergen Reactions   Food Anaphylaxis    "tree nuts"   Shellfish Allergy Shortness Of Breath   Codeine     "wild dreams"   Penicillins     "ashmatic" 09-30-2018 pt has taken in the past year and had no problems was told not to take since asthmatic.    Sulfa Antibiotics Nausea And Vomiting   Neosporin Af [Miconazole]     Rash    Family History  Problem Relation Age of Onset   Emphysema Father         smoked   Heart disease Father    Kidney cancer Father        with mets to liver and retina   Heart disease Mother    Social History   Social History Narrative   Not on file  Husband recently in remission from prostate cancer. Going on a trip to [place] to celebrate.  Review of Systems  Cardiovascular:  Positive for leg swelling (bilateral).  All other systems reviewed and are negative.    Objective:  Vitals: BP 122/77   Pulse 77   Temp 98.7 F (37.1 C)   SpO2 98%   Physical Exam Vitals and nursing note reviewed.  Constitutional:      General: She is not in acute distress.    Appearance: Normal appearance. She is not toxic-appearing.  HENT:     Head: Normocephalic and atraumatic.  Cardiovascular:     Rate and Rhythm: Normal rate and regular rhythm. No extrasystoles are present.    Pulses: Normal pulses.     Heart sounds: Normal heart sounds. No murmur heard.    No friction rub. No gallop.  Pulmonary:     Effort: Pulmonary effort is normal. No respiratory distress.     Breath sounds: Normal breath sounds. No wheezing or rales.  Musculoskeletal:     Right lower leg: 2+ Pitting Edema present.  Left lower leg: 2+ Pitting Edema present.  Skin:    General: Skin is warm and dry.  Neurological:     Mental Status: She is alert and oriented to person, place, and time. Mental status is at baseline.  Psychiatric:        Mood and Affect: Mood normal.        Behavior: Behavior normal.        Thought Content: Thought content normal.        Judgment: Judgment normal.     Results:  Studies Obtained And Personally Reviewed By Me: Labs:     Component Value Date/Time   NA 142 02/16/2023 0817   K 4.8 02/16/2023 0817   CL 107 (H) 02/16/2023 0817   CO2 23 02/16/2023 0817   GLUCOSE 90 02/16/2023 0817   GLUCOSE 127 (H) 12/10/2011 0328   BUN 24 02/16/2023 0817   CREATININE 0.97 02/16/2023 0817   CALCIUM 10.3 02/16/2023 0817   PROT 6.8 08/25/2023 0904   ALBUMIN 4.3  08/25/2023 0904   AST 14 08/25/2023 0904   ALT 13 08/25/2023 0904   ALKPHOS 67 08/25/2023 0904   BILITOT 0.6 08/25/2023 0904   GFRNONAA 66 07/16/2020 0933   GFRAA 76 07/16/2020 0933    Lab Results  Component Value Date   WBC 3.8 02/16/2023   HGB 12.6 02/16/2023   HCT 39.7 02/16/2023   MCV 91 02/16/2023   PLT 188 02/16/2023   Lab Results  Component Value Date   CHOL 175 08/25/2023   HDL 59 08/25/2023   LDLCALC 101 (H) 08/25/2023   LDLDIRECT 117 (H) 02/01/2022   TRIG 78 08/25/2023   CHOLHDL 3.0 08/25/2023   Lab Results  Component Value Date   HGBA1C 5.6 08/25/2023    Lab Results  Component Value Date   TSH 3.100 08/25/2023   Assessment & Plan:  Lower Extremity Edema, Bilateral: Recently returned from two consecutive extended trips and  edema has not resolved yet, and she is going on another trip in 2 weeks. Only other time she experienced similar symptoms was following a 14 hour flight, though apparently her left leg does swell mildly since breaking her leg. Ordering basic metabolic panel to check electrolytes and kidney functions kidney functions. Sending in Maxzide 75-50 mg to take daily for dependent edema. Keep legs elevated. Avoid prolonged standing. It may take a few days for edema to resolve. Watch salt intake. Avoid NSAIDS which can aggravate fluid retention. Your lungs are clear today. Your oxygen level is excellent. It may help to walk some if weather is not very hot. Avoid salt. Contact your primary care provider if not improving within a few days or sooner if worse.   I,Emily Lagle,acting as a Neurosurgeon for [name], MD.,have documented all relevant documentation on the behalf of [name], MD,as directed by  [name], MD while in the presence of [name], MD.   I, [name], MD, have reviewed all documentation for this visit. The documentation on 12/19/23 for the exam, diagnosis, procedures, and orders are all accurate and complete.

## 2023-12-19 NOTE — Patient Instructions (Signed)
 You have been diagnosed with dependent edema of both lower extremities.  We have prescribed Maxide 75/50 to take daily as a diuretic.  Please keep legs elevated is much as possible and avoid prolonged standing.  It may take several days for lower extremity edema to resolve.  Stay well-hydrated and watch salt intake.  Avoid nonsteroidal anti-inflammatory medications such as Aleve or Advil which can aggravate fluid retention.  Your lungs are clear today so I do not think you have congestive heart failure.  Your oxygen level is excellent.  It may help to walk some if the weather is not very hot.  Avoid excessive salt intake.  Please contact your primary care provider if not improving within a few days or sooner if worse.

## 2023-12-20 LAB — BASIC METABOLIC PANEL WITH GFR
BUN: 20 mg/dL (ref 7–25)
CO2: 28 mmol/L (ref 20–32)
Calcium: 10.5 mg/dL — ABNORMAL HIGH (ref 8.6–10.4)
Chloride: 108 mmol/L (ref 98–110)
Creat: 0.83 mg/dL (ref 0.60–1.00)
Glucose, Bld: 108 mg/dL — ABNORMAL HIGH (ref 65–99)
Potassium: 4.1 mmol/L (ref 3.5–5.3)
Sodium: 143 mmol/L (ref 135–146)
eGFR: 75 mL/min/{1.73_m2} (ref >=60)

## 2024-01-14 ENCOUNTER — Other Ambulatory Visit (HOSPITAL_COMMUNITY): Payer: Self-pay

## 2024-01-20 ENCOUNTER — Ambulatory Visit: Payer: Self-pay

## 2024-01-20 DIAGNOSIS — R600 Localized edema: Secondary | ICD-10-CM

## 2024-01-20 NOTE — Telephone Encounter (Signed)
 Copied from CRM [id number]. Topic: Clinical - Red Word Triage >> Jan 20, 2024 11:18 AM Zipporah Him wrote: Red Word that prompted transfer to Nurse Triage: Diuretic that was prescribed is causing the patient to be very dizzy, swelling has not improved  Chief Complaint: leg swelling Symptoms: wants referral to Cardiology Frequency: constant Pertinent Negatives: Patient denies cp, sob Disposition: [] ED /[] Urgent Care (no appt availability in office) / [] Appointment(In office/virtual)/ []  [facility]/ [x] Home Care/ [] Refused Recommended Disposition /[] [facility]/ []  Follow-up with PCP Additional Notes: Wants referral to cardiology.  Care advice given, denies questions; instructed to go to ER if becomes worse.   Reason for Disposition  [1] Localized swelling (e.g., small area of puffy or swollen skin) AND [2] itchy  Answer Assessment - Initial Assessment Questions 1. ONSET: "When did the swelling start?" (e.g., minutes, hours, days)     Last month and was prescribed a diuretic 2. LOCATION: "What part of the leg is swollen?"  "Are both legs swollen or just one leg?"     Both legs 3. SEVERITY: "How bad is the swelling?" (e.g., localized; mild, moderate, severe)   - Localized: Small area of swelling localized to one leg.   - MILD pedal edema: Swelling limited to foot and ankle, pitting edema < 1/4 inch (6 mm) deep, rest and elevation eliminate most or all swelling.   - MODERATE edema: Swelling of lower leg to knee, pitting edema > 1/4 inch (6 mm) deep, rest and elevation only partially reduce swelling.   - SEVERE edema: Swelling extends above knee, facial or hand swelling present.      States swelling goes down and becomes dizzy 4. REDNESS: "Does the swelling look red or infected?"     no 5. PAIN: "Is the swelling painful to touch?" If Yes, ask: "How painful is it?"   (Scale 1-10; mild, moderate or severe)     no 6. FEVER: "Do you have a fever?" If Yes, ask: "What is it, how  was it measured, and when did it start?"      no 7. CAUSE: "What do you think is causing the leg swelling?"     unknown 8. MEDICAL HISTORY: "Do you have a history of blood clots (e.g., DVT), cancer, heart failure, kidney disease, or liver failure?"     no 9. RECURRENT SYMPTOM: "Have you had leg swelling before?" If Yes, ask: "When was the last time?" "What happened that time?"     no 10. OTHER SYMPTOMS: "Do you have any other symptoms?" (e.g., chest pain, difficulty breathing)       no 11. PREGNANCY: "Is there any chance you are pregnant?" "When was your last menstrual period?"       no  Protocols used: Leg Swelling and Edema-A-AH

## 2024-01-22 NOTE — Telephone Encounter (Signed)
 Can you let the patient know she would need to get some lab tests before we decide if cardiology is appropriate for referral or if the vascular specialist is more appropriate? I have placed the orders for the labs.

## 2024-01-22 NOTE — Addendum Note (Signed)
 Addended by: Laneta Pintos on: 01/22/2024 02:12 PM   Modules accepted: Orders

## 2024-01-23 ENCOUNTER — Telehealth: Payer: Self-pay

## 2024-01-23 NOTE — Telephone Encounter (Signed)
 Per Dr. Arabella Beach response to Nurse triage message: Can you let the patient know she would need to get some lab tests before we decide if cardiology is appropriate for referral or if the vascular specialist is more appropriate? I have placed the orders for the labs.  LVM to call back to get appt for labs

## 2024-01-24 ENCOUNTER — Other Ambulatory Visit

## 2024-01-24 DIAGNOSIS — R600 Localized edema: Secondary | ICD-10-CM

## 2024-01-25 ENCOUNTER — Ambulatory Visit: Payer: Self-pay | Admitting: Family Medicine

## 2024-01-25 LAB — COMPREHENSIVE METABOLIC PANEL WITH GFR
ALT: 30 IU/L (ref 0–32)
AST: 27 IU/L (ref 0–40)
Albumin: 4.2 g/dL (ref 3.8–4.8)
Alkaline Phosphatase: 73 IU/L (ref 44–121)
BUN/Creatinine Ratio: 21 (ref 12–28)
BUN: 19 mg/dL (ref 8–27)
Bilirubin Total: 0.3 mg/dL (ref 0.0–1.2)
CO2: 23 mmol/L (ref 20–29)
Calcium: 10.1 mg/dL (ref 8.7–10.3)
Chloride: 106 mmol/L (ref 96–106)
Creatinine, Ser: 0.91 mg/dL (ref 0.57–1.00)
Globulin, Total: 1.9 g/dL (ref 1.5–4.5)
Glucose: 89 mg/dL (ref 70–99)
Potassium: 4.5 mmol/L (ref 3.5–5.2)
Sodium: 144 mmol/L (ref 134–144)
Total Protein: 6.1 g/dL (ref 6.0–8.5)
eGFR: 67 mL/min/{1.73_m2} (ref >59)

## 2024-01-25 LAB — CBC
Hematocrit: 41.1 % (ref 34.0–46.6)
Hemoglobin: 13.5 g/dL (ref 11.1–15.9)
MCH: 31.8 pg (ref 26.6–33.0)
MCHC: 32.8 g/dL (ref 31.5–35.7)
MCV: 97 fL (ref 79–97)
Platelets: 187 10*3/uL (ref 150–450)
RBC: 4.24 x10E6/uL (ref 3.77–5.28)
RDW: 13.2 % (ref 11.7–15.4)
WBC: 4.1 10*3/uL (ref 3.4–10.8)

## 2024-01-25 LAB — BRAIN NATRIURETIC PEPTIDE: BNP: 15 pg/mL (ref 0.0–100.0)

## 2024-02-01 NOTE — Addendum Note (Signed)
 Addended by: Laneta Pintos on: 02/01/2024 06:58 AM   Modules accepted: Orders

## 2024-02-01 NOTE — Telephone Encounter (Signed)
 Can you call the patient and let her know I want to get one additional blood test to rule out a blood clot in her leg.  I do not think a blood clot is likely but this test can determine if we need to get an ultrasound of her legs.  If the test is negative I will send in a referral to the vein and vascular specialist since her cardiac workup was negative

## 2024-02-02 ENCOUNTER — Other Ambulatory Visit

## 2024-02-02 DIAGNOSIS — R600 Localized edema: Secondary | ICD-10-CM

## 2024-02-03 LAB — D-DIMER, QUANTITATIVE: D-DIMER: 0.45 mg{FEU}/L (ref 0.00–0.49)

## 2024-02-13 ENCOUNTER — Telehealth: Payer: Self-pay | Admitting: *Deleted

## 2024-02-13 DIAGNOSIS — I872 Venous insufficiency (chronic) (peripheral): Secondary | ICD-10-CM

## 2024-02-13 NOTE — Telephone Encounter (Signed)
 Copied from CRM [id number]. Topic: Clinical - Medical Advice >> Feb 13, 2024  2:53 PM Eleanore Grey wrote: Reason for CRM: Patient wants to leave a message for Dr. Arabella Beach, wants to know what is next since all the labs ordered for the swelling are negative, call back number is [phone]

## 2024-02-14 NOTE — Telephone Encounter (Signed)
 Please let the patient know I will send in a referral to the vein and vascular specialist for evaluation of chronic venous insufficiency since her d-dimer and cardiac tests were negative.

## 2024-02-14 NOTE — Telephone Encounter (Signed)
 Spoke with patient and she's aware someone should be reaching out from the vein and vascular speciality clinic. Referred by Dr. Arabella Beach.

## 2024-02-16 ENCOUNTER — Other Ambulatory Visit: Payer: PPO

## 2024-02-21 ENCOUNTER — Other Ambulatory Visit (HOSPITAL_COMMUNITY): Payer: Self-pay

## 2024-02-23 ENCOUNTER — Encounter: Payer: PPO | Admitting: Family Medicine

## 2024-03-02 ENCOUNTER — Other Ambulatory Visit: Payer: Self-pay | Admitting: *Deleted

## 2024-03-02 DIAGNOSIS — I872 Venous insufficiency (chronic) (peripheral): Secondary | ICD-10-CM

## 2024-03-12 ENCOUNTER — Other Ambulatory Visit: Payer: Self-pay

## 2024-03-13 ENCOUNTER — Ambulatory Visit (HOSPITAL_COMMUNITY)
Admission: RE | Admit: 2024-03-13 | Discharge: 2024-03-13 | Disposition: A | Discharge status: Home / Self Care | Source: Ambulatory Visit | Attending: Vascular Surgery | Admitting: Vascular Surgery

## 2024-03-13 DIAGNOSIS — I872 Venous insufficiency (chronic) (peripheral): Secondary | ICD-10-CM | POA: Diagnosis present

## 2024-03-27 ENCOUNTER — Other Ambulatory Visit: Payer: Self-pay

## 2024-03-27 DIAGNOSIS — E785 Hyperlipidemia, unspecified: Secondary | ICD-10-CM

## 2024-03-27 DIAGNOSIS — R7303 Prediabetes: Secondary | ICD-10-CM

## 2024-03-27 DIAGNOSIS — Z6830 Body mass index (BMI) 30.0-30.9, adult: Secondary | ICD-10-CM

## 2024-03-28 ENCOUNTER — Other Ambulatory Visit

## 2024-03-28 DIAGNOSIS — R7303 Prediabetes: Secondary | ICD-10-CM

## 2024-03-28 DIAGNOSIS — Z6830 Body mass index (BMI) 30.0-30.9, adult: Secondary | ICD-10-CM

## 2024-03-28 DIAGNOSIS — E785 Hyperlipidemia, unspecified: Secondary | ICD-10-CM

## 2024-03-29 ENCOUNTER — Ambulatory Visit: Payer: Self-pay

## 2024-03-29 LAB — CBC WITH DIFFERENTIAL/PLATELET
Basophils Absolute: 0 x10E3/uL (ref 0.0–0.2)
Basos: 1 %
EOS (ABSOLUTE): 0.2 x10E3/uL (ref 0.0–0.4)
Eos: 5 %
Hematocrit: 41.9 % (ref 34.0–46.6)
Hemoglobin: 13.5 g/dL (ref 11.1–15.9)
Immature Grans (Abs): 0 x10E3/uL (ref 0.0–0.1)
Immature Granulocytes: 0 %
Lymphocytes Absolute: 1.6 x10E3/uL (ref 0.7–3.1)
Lymphs: 35 %
MCH: 30.8 pg (ref 26.6–33.0)
MCHC: 32.2 g/dL (ref 31.5–35.7)
MCV: 95 fL (ref 79–97)
Monocytes Absolute: 0.5 x10E3/uL (ref 0.1–0.9)
Monocytes: 10 %
Neutrophils Absolute: 2.2 x10E3/uL (ref 1.4–7.0)
Neutrophils: 49 %
Platelets: 193 x10E3/uL (ref 150–450)
RBC: 4.39 x10E6/uL (ref 3.77–5.28)
RDW: 12.7 % (ref 11.7–15.4)
WBC: 4.5 x10E3/uL (ref 3.4–10.8)

## 2024-03-29 LAB — COMPREHENSIVE METABOLIC PANEL WITH GFR
ALT: 18 IU/L (ref 0–32)
AST: 26 IU/L (ref 0–40)
Albumin: 4.5 g/dL (ref 3.8–4.8)
Alkaline Phosphatase: 72 IU/L (ref 44–121)
BUN/Creatinine Ratio: 22 (ref 12–28)
BUN: 22 mg/dL (ref 8–27)
Bilirubin Total: 0.6 mg/dL (ref 0.0–1.2)
CO2: 18 mmol/L — ABNORMAL LOW (ref 20–29)
Calcium: 10.4 mg/dL — ABNORMAL HIGH (ref 8.7–10.3)
Chloride: 107 mmol/L — ABNORMAL HIGH (ref 96–106)
Creatinine, Ser: 0.99 mg/dL (ref 0.57–1.00)
Globulin, Total: 2.4 g/dL (ref 1.5–4.5)
Glucose: 85 mg/dL (ref 70–99)
Potassium: 4.7 mmol/L (ref 3.5–5.2)
Sodium: 142 mmol/L (ref 134–144)
Total Protein: 6.9 g/dL (ref 6.0–8.5)
eGFR: 60 mL/min/1.73 (ref >59)

## 2024-03-29 LAB — LIPID PANEL
Chol/HDL Ratio: 2.9 ratio (ref 0.0–4.4)
Cholesterol, Total: 166 mg/dL (ref 100–199)
HDL: 58 mg/dL (ref >39)
LDL Chol Calc (NIH): 95 mg/dL (ref 0–99)
Triglycerides: 70 mg/dL (ref 0–149)
VLDL Cholesterol Cal: 13 mg/dL (ref 5–40)

## 2024-03-29 LAB — HEMOGLOBIN A1C
Est. average glucose Bld gHb Est-mCnc: 111 mg/dL
Hgb A1c MFr Bld: 5.5 % (ref 4.8–5.6)

## 2024-03-29 LAB — TSH: TSH: 3.28 u[IU]/mL (ref 0.450–4.500)

## 2024-03-29 LAB — VITAMIN D 25 HYDROXY (VIT D DEFICIENCY, FRACTURES): Vit D, 25-Hydroxy: 40.1 ng/mL (ref 30.0–100.0)

## 2024-04-04 ENCOUNTER — Telehealth: Payer: Self-pay

## 2024-04-04 ENCOUNTER — Other Ambulatory Visit: Payer: Self-pay

## 2024-04-04 ENCOUNTER — Ambulatory Visit (INDEPENDENT_AMBULATORY_CARE_PROVIDER_SITE_OTHER)

## 2024-04-04 VITALS — BP 112/75 | HR 70 | Temp 98.0°F | Ht 70.0 in | Wt 214.1 lb

## 2024-04-04 DIAGNOSIS — R600 Localized edema: Secondary | ICD-10-CM | POA: Diagnosis not present

## 2024-04-04 DIAGNOSIS — J4520 Mild intermittent asthma, uncomplicated: Secondary | ICD-10-CM

## 2024-04-04 DIAGNOSIS — E782 Mixed hyperlipidemia: Secondary | ICD-10-CM | POA: Diagnosis not present

## 2024-04-04 DIAGNOSIS — Z0000 Encounter for general adult medical examination without abnormal findings: Secondary | ICD-10-CM | POA: Diagnosis not present

## 2024-04-04 MED ORDER — TRIAMTERENE 50 MG PO CAPS: 50.0000 mg | ORAL_CAPSULE | Freq: Every day | ORAL | 1 refills | Status: DC | PRN

## 2024-04-04 MED ORDER — SPIRONOLACTONE 25 MG PO TABS: 25.0000 mg | ORAL_TABLET | Freq: Every day | ORAL | 0 refills | Status: AC

## 2024-04-04 NOTE — Telephone Encounter (Signed)
 Copied from CRM [id number]. Topic: Clinical - Prescription Issue >> Apr 04, 2024 10:55 AM Rosaria BRAVO wrote: Reason for CRM: Corean from [facility] called to report that they do not have triamterene  (DYRENIUM ) 50 MG capsule in stock and her copay would be a 100 dollars. Corean wants to know if there is an alternative option due to cost and availability.

## 2024-04-04 NOTE — Assessment & Plan Note (Signed)
 No adventitious lung sounds on exam today.  Patient reports that her symptoms are well-controlled with montelukast  10 mg daily.  No longer requiring Breo Ellipta  inhaler.  Patient has albuterol  inhaler on hand if needed for chest tightness/wheezing, however she does not require this often.  Asthma symptoms well-controlled and stable.  Will continue to monitor.

## 2024-04-04 NOTE — Assessment & Plan Note (Signed)
 Went over lab work in detail with the patient. Answered all patient questions. Went over and encouraged/ordered age-appropriate health screenings. Patient is up-to-date on all other screenings. No vaccines today. Encouraged patient to aim for 150+ minutes of physical activity per week or just increase their physical activity in general in combination with a well balanced diet that prioritizes protein and fiber to support a healthy lifestyle. Will plan for next physical in 1 year, sooner PRN. Patient verbalized understanding and was in agreement with the plan.

## 2024-04-04 NOTE — Assessment & Plan Note (Signed)
 Last lipid panel: LDL 95, HDL 58, triglycerides 70.  Continue rosuvastatin  5 mg daily.  CMP within normal limits except for mildly elevated calcium  and chloride. Will cont to monitor

## 2024-04-04 NOTE — Patient Instructions (Addendum)
 It was nice to see you today!  As we discussed in clinic:  - I have sent in triamterene  50 mg for you to take as needed for swelling. This medication does not contain the hydrochlorothiazide component, so it may help decrease the dizziness.  -Continue all of your current medications as prescribed -Your blood work was all improved/stable from the last time we checked it -I will plan to see you back in 6 months for a follow up! It was nice to meet you!   If you have any problems before your next visit feel free to message me via MyChart (minor issues or questions) or call the office, otherwise you may reach out to schedule an office visit.  Thank you! Saddie Sacks, PA-C

## 2024-04-04 NOTE — Progress Notes (Signed)
 Complete physical exam  Patient: Cindy Rogers   DOB: 01/09/1951   73 y.o. Female  MRN: [id number]  Subjective:    Chief Complaint  Patient presents with   Annual Exam    Physical    CHANON LONEY is a 73 y.o. female who presents today for a complete physical exam. She reports consuming a general diet. Patient works out at [employer] several times/week and also goes golfing 2-4 times per week for exercise. She reports she has lost 50 lbs over the last several years through lifestyle changes. Would like to lose about 20 more lbs before she is at her goal. She generally feels well. She reports sleeping well. Reports that she does have regular bowel movements when she uses Miralax a few times per week. She does not have additional problems to discuss today.   Patient reports that she has an appointment with her vascular specialist in August to evaluate her venous insufficiency. She has been taking triamterene -hydrochlorothiazide as needed for swelling, which she reports keeps the swelling well controlled but makes her very dizzy. She read online that hydrochlorothiazide contains Sulfa and she is allergic to sulfa because it does make her dizzy.   Most recent fall risk assessment:    04/04/2024    9:00 AM  Fall Risk   Falls in the past year? 0  Risk for fall due to : No Fall Risks  Follow up Falls evaluation completed     Most recent depression screenings:    04/04/2024    9:00 AM 12/19/2023    2:20 PM  PHQ 2/9 Scores  PHQ - 2 Score 0 0    Vision:Within last year and Dental: No current dental problems and Receives regular dental care    Patient Care Team: Gayle Saddie JULIANNA DEVONNA as PCP - General (Physician Assistant) Darlean Ozell NOVAK, MD as Consulting Physician (Pulmonary Disease) Tess Agent, MD as Consulting Physician (Sports Medicine) Court Pulling, MD as Consulting Physician (Dermatology) Lavonia Lye, MD as Consulting Physician (Ophthalmology)   Outpatient Medications  Prior to Visit  Medication Sig   albuterol  (PROAIR  HFA) 108 (90 Base) MCG/ACT inhaler INHALE 2 PUFFS EVERY 4 HOURS AS NEEDED ONLY IF YOU CAN'T CATCH YOUR BREATH. (Patient taking differently: as needed. INHALE 2 PUFFS EVERY 4 HOURS AS NEEDED ONLY IF YOU CAN'T CATCH YOUR BREATH.)   calcium -vitamin D  (OSCAL WITH D) 500-200 MG-UNIT per tablet Take 1 tablet by mouth daily.   cholecalciferol (VITAMIN D ) 1000 UNITS tablet Take 2,000 Units by mouth daily.    esomeprazole (NEXIUM) 20 MG capsule Take 20 mg by mouth daily at 12 noon. (Patient taking differently: Take 20 mg by mouth 3 (three) times a week.)   famotidine (PEPCID) 20 MG tablet Take by mouth one at bedtime   fluticasone  (FLONASE ) 50 MCG/ACT nasal spray Place 2 sprays into the nose daily as needed. For allergies   ibuprofen (ADVIL,MOTRIN) 200 MG tablet Take 600 mg by mouth every 6 (six) hours as needed. For pain   montelukast  (SINGULAIR ) 10 MG tablet Take 1 tablet (10 mg total) by mouth at bedtime. (Patient taking differently: Take 10 mg by mouth as needed.)   naproxen (NAPROSYN) 500 MG tablet Take 500 mg by mouth as needed.   polyethylene glycol (MIRALAX / GLYCOLAX) 17 g packet Take 17 g by mouth daily. 2-3 times weekly   rosuvastatin  (CRESTOR ) 5 MG tablet Take 1 tablet (5 mg total) by mouth 2 (two) times a week.   [DISCONTINUED]  triamterene -hydrochlorothiazide (MAXZIDE) 75-50 MG tablet One tab by mouth daily for dependent edema   No facility-administered medications prior to visit.    ROS  Per HPI      Objective:     BP 112/75   Pulse 70   Temp 98 F (36.7 C) (Oral)   Ht 5' 10 (1.778 m)   Wt 214 lb 1.9 oz (97.1 kg)   SpO2 95%   BMI 30.72 kg/m    Physical Exam Constitutional:      General: She is not in acute distress.    Appearance: Normal appearance.  HENT:     Right Ear: Tympanic membrane normal.     Left Ear: Tympanic membrane normal.     Mouth/Throat:     Mouth: Mucous membranes are moist.     Pharynx:  Oropharynx is clear.  Eyes:     Pupils: Pupils are equal, round, and reactive to light.  Cardiovascular:     Rate and Rhythm: Normal rate and regular rhythm.     Heart sounds: Normal heart sounds. No murmur heard.    No friction rub. No gallop.  Pulmonary:     Effort: Pulmonary effort is normal. No respiratory distress.     Breath sounds: Normal breath sounds.  Abdominal:     General: Abdomen is flat. Bowel sounds are normal.     Palpations: Abdomen is soft.  Musculoskeletal:        General: No swelling.     Cervical back: Normal range of motion.  Lymphadenopathy:     Cervical: No cervical adenopathy.  Skin:    General: Skin is warm and dry.  Neurological:     General: No focal deficit present.     Mental Status: She is alert.  Psychiatric:        Mood and Affect: Mood normal.        Behavior: Behavior normal.        Thought Content: Thought content normal.       No results found for any visits on 04/04/24. Last CBC Lab Results  Component Value Date   WBC 4.5 03/28/2024   HGB 13.5 03/28/2024   HCT 41.9 03/28/2024   MCV 95 03/28/2024   MCH 30.8 03/28/2024   RDW 12.7 03/28/2024   PLT 193 03/28/2024   Last metabolic panel Lab Results  Component Value Date   GLUCOSE 85 03/28/2024   NA 142 03/28/2024   K 4.7 03/28/2024   CL 107 (H) 03/28/2024   CO2 18 (L) 03/28/2024   BUN 22 03/28/2024   CREATININE 0.99 03/28/2024   EGFR 60 03/28/2024   CALCIUM  10.4 (H) 03/28/2024   PROT 6.9 03/28/2024   ALBUMIN 4.5 03/28/2024   LABGLOB 2.4 03/28/2024   AGRATIO 2.5 (H) 02/16/2023   BILITOT 0.6 03/28/2024   ALKPHOS 72 03/28/2024   AST 26 03/28/2024   ALT 18 03/28/2024   Last lipids Lab Results  Component Value Date   CHOL 166 03/28/2024   HDL 58 03/28/2024   LDLCALC 95 03/28/2024   LDLDIRECT 117 (H) 02/01/2022   TRIG 70 03/28/2024   CHOLHDL 2.9 03/28/2024   Last hemoglobin A1c Lab Results  Component Value Date   HGBA1C 5.5 03/28/2024   Last thyroid   functions Lab Results  Component Value Date   TSH 3.280 03/28/2024   T3TOTAL 105 08/29/2018   Last vitamin D  Lab Results  Component Value Date   VD25OH 40.1 03/28/2024        Assessment & Plan:  Routine Health Maintenance and Physical Exam  Health Maintenance  Topic Date Due   COVID-19 Vaccine (7 - 2024-25 season) 05/15/2023   Flu Shot  04/13/2024   Medicare Annual Wellness Visit  08/29/2024   Mammogram  07/10/2025   DTaP/Tdap/Td vaccine (3 - Td or Tdap) 09/11/2028   Colon Cancer Screening  10/12/2032   Pneumococcal Vaccine for age over 8  Completed   DEXA scan (bone density measurement)  Completed   Hepatitis C Screening  Completed   Zoster (Shingles) Vaccine  Completed   Hepatitis B Vaccine  Aged Out   HPV Vaccine  Aged Out   Meningitis B Vaccine  Aged Out    Discussed health benefits of physical activity, and encouraged her to engage in regular exercise appropriate for her age and condition.  Mixed hyperlipidemia Assessment & Plan: Last lipid panel: LDL 95, HDL 58, triglycerides 70.  Continue rosuvastatin  5 mg daily.  CMP within normal limits except for mildly elevated calcium  and chloride. Will cont to monitor   Mild intermittent intrinsic asthma without complication Assessment & Plan: No adventitious lung sounds on exam today.  Patient reports that her symptoms are well-controlled with montelukast  10 mg daily.  No longer requiring Breo Ellipta  inhaler.  Patient has albuterol  inhaler on hand if needed for chest tightness/wheezing, however she does not require this often.  Asthma symptoms well-controlled and stable.  Will continue to monitor.   General medical exam Assessment & Plan: Santina over lab work in detail with the patient. Answered all patient questions. Went over and encouraged/ordered age-appropriate health screenings. Patient is up-to-date on all other screenings. No vaccines today. Encouraged patient to aim for 150+ minutes of physical activity per  week or just increase their physical activity in general in combination with a well balanced diet that prioritizes protein and fiber to support a healthy lifestyle. Will plan for next physical in 1 year, sooner PRN. Patient verbalized understanding and was in agreement with the plan.    Lower extremity edema Assessment & Plan: Have discontinued her Maxide 75-50 mg as needed medication due to extreme dizziness/weakness when she takes this medication.  Suspect dizziness likely related to low blood pressure after taking this medication and the sulfa component and HCTZ.  Will replace with triamterene  50 mg daily as needed for swelling.  Advised patient to follow-up if dizziness/weakness is still occurring with this medication.  Advised her to keep her follow-up appointment with vascular specialist in August.  Patient had ultrasound of bilateral lower extremity which ruled out DVT/clot, however did confirm leaky veins. Will continue to monitor.   Other orders -     Triamterene ; Take 1 capsule (50 mg total) by mouth daily as needed (Swelling).  Dispense: 30 capsule; Refill: 1    Return in about 6 months (around 10/05/2024) for HLD, pre-DM, edema.     Saddie JULIANNA Sacks, PA-C

## 2024-04-04 NOTE — Assessment & Plan Note (Signed)
 Have discontinued her Maxide 75-50 mg as needed medication due to extreme dizziness/weakness when she takes this medication.  Suspect dizziness likely related to low blood pressure after taking this medication and the sulfa component and HCTZ.  Will replace with triamterene  50 mg daily as needed for swelling.  Advised patient to follow-up if dizziness/weakness is still occurring with this medication.  Advised her to keep her follow-up appointment with vascular specialist in August.  Patient had ultrasound of bilateral lower extremity which ruled out DVT/clot, however did confirm leaky veins. Will continue to monitor.

## 2024-04-07 ENCOUNTER — Other Ambulatory Visit (HOSPITAL_COMMUNITY): Payer: Self-pay

## 2024-04-25 NOTE — Progress Notes (Signed)
 [facility]           [facility] [place]  History and Physical   Cindy Rogers is a 73 y.o. female who presents with BLE swelling.    She was seen by her PCP on 04/04/2024 at which time she reported working out at the gym several times per week and golfs 2-4 times per week.  She reported a 50 lb weight loss over the past few years and working on losing another 20 lbs to be at her goal.  She was taking triamterene /hydrochlorothiazide for swelling, which was working well but did make her dizzy when she would take it.    She states that she has intermittent leg swelling and the right is worse than the left.  She states that her legs feel heavy at times.  She states that it gets worse with prolonged sitting (long plane rides).  She states that she measures her ankle on the right and most of the time in is 9.25 but can get above 10.  She states she bought some compression socks to wear for long plane rides and this has helped.  She does water aerobics once a week and plays 9-18 holes of golf weekly as well.  She did go to the beach last week and had a little more leg swelling that normal.    She states that several years ago, she had lost about 50 lbs and states she definitely felt better afterwards.  She has hx of left fibula fx but did not require any surgery.  She has hx of left knee surgery.  She recently had a skin lesion removed from the medial aspect of the right knee.  She does not have any hx of DVT or vein procedures on her legs.  She does not have any skin color changes.  She thinks her mom had hx of varicose veins but does not remember her legs swelling.  She states that shen she took the triamterene /hydrochlorothiazide, it did help with the swelling but made her feel lethargic.  When she looked it up, it contained sulfa, which she is allergic to.  She was then prescribed spironolactone  and she does not feel this has really helped with her leg swelling.     The pt is on  a statin for cholesterol management.  The pt is not on a daily aspirin.   Other AC:  none The pt is on diuretic for hypertension.   The pt is not on medication for diabetes.   Tobacco hx:  never  Pt does not have family hx of AAA.  Past Medical History:  Diagnosis Date   Asthma    Asthma    Phreesia 07/15/2020   Bronchitis 01/26/2017   GERD (gastroesophageal reflux disease)    Headache(784.0)    Pneumonia    PONV (postoperative nausea and vomiting)    Shortness of breath     Past Surgical History:  Procedure Laterality Date   ABDOMINAL HYSTERECTOMY     CHOLECYSTECTOMY     KNEE SURGERY Left    03/2023 meniscus   RHINOPLASTY      Social History   Socioeconomic History   Marital status: Married    Spouse name: Not on file   Number of children: Not on file   Years of education: Not on file   Highest education level: Bachelor's degree (e.g., BA, AB, BS)  Occupational History   Occupation: Retired Runner, broadcasting/film/video  Employer: [employer]  Tobacco Use   Smoking status: Never    Passive exposure: Never   Smokeless tobacco: Never  Vaping Use   Vaping status: Never Used  Substance and Sexual Activity   Alcohol use: Yes    Comment: glass of wine approx twice per wk   Drug use: No   Sexual activity: Yes    Birth control/protection: Post-menopausal  Other Topics Concern   Not on file  Social History Narrative   Not on file   Social Drivers of Health   Financial Resource Strain: Low Risk  (10/31/2023)   Overall Financial Resource Strain (CARDIA)    Difficulty of Paying Living Expenses: Not hard at all  Food Insecurity: No Food Insecurity (10/31/2023)   Hunger Vital Sign    Worried About Running Out of Food in the Last Year: Never true    Ran Out of Food in the Last Year: Never true  Transportation Needs: No Transportation Needs (10/31/2023)   PRAPARE - Administrator, Civil Service (Medical): No    Lack of Transportation (Non-Medical): No  Physical  Activity: Insufficiently Active (10/31/2023)   Exercise Vital Sign    Days of Exercise per Week: 3 days    Minutes of Exercise per Session: 40 min  Stress: No Stress Concern Present (10/31/2023)   [employer] of Occupational Health - Occupational Stress Questionnaire    Feeling of Stress : Only a little  Recent Concern: Stress - Stress Concern Present (08/24/2023)   [employer] of Occupational Health - Occupational Stress Questionnaire    Feeling of Stress : To some extent  Social Connections: Socially Integrated (10/31/2023)   Social Connection and Isolation Panel    Frequency of Communication with Friends and Family: More than three times a week    Frequency of Social Gatherings with Friends and Family: Three times a week    Attends Religious Services: More than 4 times per year    Active Member of Clubs or Organizations: Yes    Attends Banker Meetings: More than 4 times per year    Marital Status: Married  Catering manager Violence: Not At Risk (08/30/2023)   Humiliation, Afraid, Rape, and Kick questionnaire    Fear of Current or Ex-Partner: No    Emotionally Abused: No    Physically Abused: No    Sexually Abused: No     Family History  Problem Relation Age of Onset   Emphysema Father        smoked   Heart disease Father    Kidney cancer Father        with mets to liver and retina   Heart disease Mother     Current Outpatient Medications  Medication Sig Dispense Refill   albuterol  (PROAIR  HFA) 108 (90 Base) MCG/ACT inhaler INHALE 2 PUFFS EVERY 4 HOURS AS NEEDED ONLY IF YOU CAN'T CATCH YOUR BREATH. (Patient taking differently: as needed. INHALE 2 PUFFS EVERY 4 HOURS AS NEEDED ONLY IF YOU CAN'T CATCH YOUR BREATH.) 6.7 g 1   calcium -vitamin D  (OSCAL WITH D) 500-200 MG-UNIT per tablet Take 1 tablet by mouth daily.     cholecalciferol (VITAMIN D ) 1000 UNITS tablet Take 2,000 Units by mouth daily.      esomeprazole (NEXIUM) 20 MG capsule Take 20 mg by  mouth daily at 12 noon. (Patient taking differently: Take 20 mg by mouth 3 (three) times a week.)     famotidine (PEPCID) 20 MG tablet Take by mouth one at  bedtime     fluticasone  (FLONASE ) 50 MCG/ACT nasal spray Place 2 sprays into the nose daily as needed. For allergies     ibuprofen (ADVIL,MOTRIN) 200 MG tablet Take 600 mg by mouth every 6 (six) hours as needed. For pain     montelukast  (SINGULAIR ) 10 MG tablet Take 1 tablet (10 mg total) by mouth at bedtime. (Patient taking differently: Take 10 mg by mouth as needed.) 90 tablet 3   naproxen (NAPROSYN) 500 MG tablet Take 500 mg by mouth as needed.     polyethylene glycol (MIRALAX / GLYCOLAX) 17 g packet Take 17 g by mouth daily. 2-3 times weekly     rosuvastatin  (CRESTOR ) 5 MG tablet Take 1 tablet (5 mg total) by mouth 2 (two) times a week. 90 tablet 3   spironolactone  (ALDACTONE ) 25 MG tablet Take 1 tablet (25 mg total) by mouth daily. 30 tablet 0   No current facility-administered medications for this visit.    Allergies  Allergen Reactions   Food Anaphylaxis    tree nuts   Shellfish Allergy  Shortness Of Breath   Codeine     wild dreams   Penicillins     ashmatic 09-30-2018 pt has taken in the past year and had no problems was told not to take since asthmatic.    Sulfa Antibiotics Nausea And Vomiting   Neosporin Af [Miconazole]     Rash    REVIEW OF SYSTEMS:   [X]  denotes positive finding, [ ]  denotes negative finding Cardiac  Comments:  Chest pain or chest pressure:    Shortness of breath upon exertion:    Short of breath when lying flat:    Irregular heart rhythm:        Vascular    Pain in calf, thigh, or hip brought on by ambulation:    Pain in feet at night that wakes you up from your sleep:     Blood clot in your veins:    Leg swelling:  x See HPI      Pulmonary    Oxygen at home:    Productive cough:     Wheezing:         Neurologic    Sudden weakness in arms or legs:     Sudden numbness in arms or  legs:     Sudden onset of difficulty speaking or slurred speech:    Temporary loss of vision in one eye:     Problems with dizziness:         Gastrointestinal    Blood in stool:     Vomited blood:         Genitourinary    Burning when urinating:     Blood in urine:        Psychiatric    Major depression:         Hematologic    Bleeding problems:    Problems with blood clotting too easily:        Skin    Rashes or ulcers:        Constitutional    Fever or chills:      PHYSICAL EXAMINATION:  Today's Vitals   04/26/24 0848  BP: 136/67  Pulse: 73  Temp: 98.4 F (36.9 C)  TempSrc: Temporal   There is no height or weight on file to calculate BMI.   General:  WDWN in NAD; vital signs documented above Gait: Not observed HENT: WNL, normocephalic Pulmonary: normal non-labored breathing without wheezing Cardiac: regular HR; without  carotid bruits Abdomen: soft, NT, aortic pulse is not palpable Skin: without rashes Vascular Exam/Pulses:  Right Left  Radial 2+ (normal) 2+ (normal)  DP 2+ (normal) 2+ (normal)   Extremities: mild BLE swelling; no skin color changes or ulcerations.   Neurologic: A&O X 3;  moving all extremities equally Psychiatric:  The pt has Normal affect.   Non-Invasive Vascular Imaging:   Venous duplex on 03/13/2024: Venous Reflux Times  +--------------+---------+------+-----------+------------+--------+  RIGHT        Reflux NoRefluxReflux TimeDiameter cmsComments                          Yes                                   +--------------+---------+------+-----------+------------+--------+  CFV          no                                              +--------------+---------+------+-----------+------------+--------+  FV mid        no                                              +--------------+---------+------+-----------+------------+--------+  Popliteal    no                                               +--------------+---------+------+-----------+------------+--------+  GSV at [facility]              yes    >500 ms      0.75              +--------------+---------+------+-----------+------------+--------+  GSV prox thigh          yes    >500 ms      0.62              +--------------+---------+------+-----------+------------+--------+  GSV mid thigh           yes    >500 ms      0.4               +--------------+---------+------+-----------+------------+--------+  GSV dist thighno                            0.44              +--------------+---------+------+-----------+------------+--------+  GSV at knee   no                            0.45              +--------------+---------+------+-----------+------------+--------+  GSV prox calf no                            0.37              +--------------+---------+------+-----------+------------+--------+  GSV mid calf            yes    >  500 ms      0.23              +--------------+---------+------+-----------+------------+--------+  SSV at [facility]    no                            0.27              +--------------+---------+------+-----------+------------+--------+  SSV prox calf no                            0.42              +--------------+---------+------+-----------+------------+--------+     +--------------+---------+------+-----------+------------+-------------+  LEFT         Reflux NoRefluxReflux TimeDiameter cmsComments                               Yes                                        +--------------+---------+------+-----------+------------+-------------+  CFV          no                                                   +--------------+---------+------+-----------+------------+-------------+  FV mid        no                                                   +--------------+---------+------+-----------+------------+-------------+  Popliteal     no                                                   +--------------+---------+------+-----------+------------+-------------+  GSV at [facility]              yes    >500 ms      0.68                   +--------------+---------+------+-----------+------------+-------------+  GSV prox thighno                            0.49                   +--------------+---------+------+-----------+------------+-------------+  GSV mid thigh no                            0.32                   +--------------+---------+------+-----------+------------+-------------+  GSV dist thighno                            0.23                   +--------------+---------+------+-----------+------------+-------------+  GSV at knee  no                            0.37                   +--------------+---------+------+-----------+------------+-------------+  GSV prox calf no                            0.37                   +--------------+---------+------+-----------+------------+-------------+  GSV mid calf                                        out of fascia  +--------------+---------+------+-----------+------------+-------------+  SSV at [facility]    no                            0.25                   +--------------+---------+------+-----------+------------+-------------+  SSV prox calf no                            0.28                   +--------------+---------+------+-----------+------------+-------------+   Summary:  Right:  - No evidence of deep vein thrombosis seen in the right lower extremity,  from the common femoral through the popliteal veins.  - No evidence of superficial venous thrombosis in the right lower  extremity.  - No evidence of superficial venous reflux seen in the right short  saphenous vein.  - Venous reflux is noted in the right sapheno-femoral junction.  - Venous reflux is noted in the right greater saphenous vein in the thigh.  -  Venous reflux is noted in the right greater saphenous vein in the calf.    Left:  - No evidence of deep vein thrombosis seen in the left lower extremity,  from the common femoral through the popliteal veins.  - No evidence of superficial venous thrombosis in the left lower  extremity.  - No evidence of superficial venous reflux seen in the left short  saphenous vein.  - Venous reflux is noted in the left sapheno-femoral junction.     Cindy Rogers is a 73 y.o. female who presents with: BLE with right more symptomatic than the left.   -pt has easily palpable DP pedal pulses bilaterally -in the right lower extremity, the pt does not have evidence of DVT.  Pt does have venous reflux in the GSV at the [facility] as well as the proximal and mid thigh that measures 0.4-0.62cm.  she also has some venous reflux in the GSV in the mid calf that measures 0.23cm.   -in the left lower extremity, there is no evidence of DVT.  She does have venous reflux only in the GSV at the [facility]. -discussed with pt about wearing knee high 15-20 mmHg compression stockings and pt was measured for these today.    -discussed the importance of leg elevation and how to elevate properly - pt is advised to elevate their legs and a diagram is given to them to demonstrate for pt to lay flat on their back with knees elevated and slightly bent with their  feet higher than their knees, which puts their feet higher than their heart for 15 minutes per day.  If pt cannot lay flat, advised to lay as flat as possible.  -pt is advised to continue as much walking as possible and avoid sitting or standing for long periods of time.  -discussed importance of weight loss and exercise and that water aerobics would also be beneficial.  Several years ago, she did lose ~ 50 lbs and she continues to stay active exercising, playing golf and water aerobics.  -handout with recommendations given -pt will f/u as needed.  She knows to call if her symptoms worsen  and we can re-evaluate.    Lucie Apt, [facility] [phone]  Clinic MD:  [place]

## 2024-04-26 ENCOUNTER — Encounter: Payer: Self-pay | Admitting: Physician Assistant

## 2024-04-26 ENCOUNTER — Ambulatory Visit: Attending: Vascular Surgery | Admitting: Physician Assistant

## 2024-04-26 VITALS — BP 136/67 | HR 73 | Temp 98.4°F

## 2024-04-26 DIAGNOSIS — M7989 Other specified soft tissue disorders: Secondary | ICD-10-CM | POA: Diagnosis not present

## 2024-04-26 DIAGNOSIS — I8393 Asymptomatic varicose veins of bilateral lower extremities: Secondary | ICD-10-CM

## 2024-06-14 ENCOUNTER — Other Ambulatory Visit (HOSPITAL_COMMUNITY): Payer: Self-pay

## 2024-07-16 LAB — HM MAMMOGRAPHY

## 2024-08-20 ENCOUNTER — Other Ambulatory Visit (HOSPITAL_COMMUNITY): Payer: Self-pay

## 2024-09-20 ENCOUNTER — Ambulatory Visit (INDEPENDENT_AMBULATORY_CARE_PROVIDER_SITE_OTHER): Payer: PPO

## 2024-09-20 VITALS — Ht 70.0 in | Wt 213.0 lb

## 2024-09-20 DIAGNOSIS — Z0000 Encounter for general adult medical examination without abnormal findings: Secondary | ICD-10-CM | POA: Diagnosis not present

## 2024-09-20 NOTE — Patient Instructions (Addendum)
 Ms. Cummiskey,  Thank you for taking the time for your Medicare Wellness Visit. I appreciate your continued commitment to your health goals. Please review the care plan we discussed, and feel free to reach out if I can assist you further.  Please note that Annual Wellness Visits do not include a physical exam. Some assessments may be limited, especially if the visit was conducted virtually. If needed, we may recommend an in-person follow-up with your provider.  Ongoing Care Seeing your primary care provider every 3 to 6 months helps us  monitor your health and provide consistent, personalized care.   Referrals If a referral was made during today's visit and you haven't received any updates within two weeks, please contact the referred provider directly to check on the status.  Recommended Screenings:  Health Maintenance  Topic Date Due   COVID-19 Vaccine (7 - 2025-26 season) 05/14/2024   Medicare Annual Wellness Visit  09/20/2025   Breast Cancer Screening  07/16/2026   DTaP/Tdap/Td vaccine (3 - Td or Tdap) 09/11/2028   Colon Cancer Screening  10/12/2032   Pneumococcal Vaccine for age over 73  Completed   Flu Shot  Completed   Osteoporosis screening with Bone Density Scan  Completed   Hepatitis C Screening  Completed   Zoster (Shingles) Vaccine  Completed   Meningitis B Vaccine  Aged Out       09/20/2024   10:54 AM  Advanced Directives  Does Patient Have a Medical Advance Directive? Yes  Type of Estate Agent of [place];Living will  Does patient want to make changes to medical advance directive? Yes (Inpatient - patient requests chaplain consult to change a medical advance directive)  Copy of Healthcare Power of Attorney in Chart? No - copy requested    Vision: Annual vision screenings are recommended for early detection of glaucoma, cataracts, and diabetic retinopathy. These exams can also reveal signs of chronic conditions such as diabetes and high blood  pressure.  Dental: Annual dental screenings help detect early signs of oral cancer, gum disease, and other conditions linked to overall health, including heart disease and diabetes.

## 2024-09-20 NOTE — Progress Notes (Addendum)
 "  Chief Complaint  Patient presents with   Medicare Wellness     Subjective:   Cindy Rogers is a 74 y.o. female who presents for a Medicare Annual Wellness Visit.  Visit info / Clinical Intake: Medicare Wellness Visit Type:: Subsequent Annual Wellness Visit Persons participating in visit and providing information:: patient Medicare Wellness Visit Mode:: Video Since this visit was completed virtually, some vitals may be partially provided or unavailable. Missing vitals are due to the limitations of the virtual format.: Documented vitals are patient reported If Telephone or Video please confirm:: I connected with patient using audio/video enable telemedicine. I verified patient identity with two identifiers, discussed telehealth limitations, and patient agreed to proceed. Patient Location:: Home Provider Location:: Office Interpreter Needed?: No Pre-visit prep was completed: yes AWV questionnaire completed by patient prior to visit?: yes Date:: 09/19/24 Living arrangements:: lives with spouse/significant other Patient's Overall Health Status Rating: very good Typical amount of pain: some Does pain affect daily life?: no Are you currently prescribed opioids?: no  Dietary Habits and Nutritional Risks How many meals a day?: 3 Eats fruit and vegetables daily?: yes Most meals are obtained by: preparing own meals In the last 2 weeks, have you had any of the following?: none Diabetic:: no  Functional Status Activities of Daily Living (to include ambulation/medication): Independent Ambulation: Independent with device- listed below Home Assistive Devices/Equipment: Eyeglasses Medication Administration: Independent Home Management (perform basic housework or laundry): Independent Manage your own finances?: yes Primary transportation is: driving Concerns about vision?: no *vision screening is required for WTM* Concerns about hearing?: no  Fall Screening Falls in the past year?:  0 Number of falls in past year: 0 Was there an injury with Fall?: 0 Fall Risk Category Calculator: 0 Patient Fall Risk Level: Low Fall Risk  Fall Risk Patient at Risk for Falls Due to: No Fall Risks Fall risk Follow up: Falls evaluation completed; Falls prevention discussed  Home and Transportation Safety: All rugs have non-skid backing?: (!) no All stairs or steps have railings?: yes Grab bars in the bathtub or shower?: yes Have non-skid surface in bathtub or shower?: yes Good home lighting?: yes Regular seat belt use?: yes Hospital stays in the last year:: no  Cognitive Assessment Difficulty concentrating, remembering, or making decisions? : no Will 6CIT or Mini Cog be Completed: yes What year is it?: 0 points What month is it?: 0 points Give patient an address phrase to remember (5 components): [address] [place], [place] About what time is it?: 0 points Count backwards from 20 to 1: 0 points Say the months of the year in reverse: 0 points Repeat the address phrase from earlier: 0 points 6 CIT Score: 0 points  Advance Directives (For Healthcare) Does Patient Have a Medical Advance Directive?: Yes Does patient want to make changes to medical advance directive?: Yes (Inpatient - patient requests chaplain consult to change a medical advance directive) Type of Advance Directive: Healthcare Power of [place]; Living will Copy of Healthcare Power of Attorney in Chart?: No - copy requested Copy of Living Will in Chart?: No - copy requested  Reviewed/Updated  Reviewed/Updated: Reviewed All (Medical, Surgical, Family, Medications, Allergies, Care Teams, Patient Goals)    Allergies (verified) Food, Shellfish allergy , Codeine, Penicillins, Sulfa antibiotics, and Neosporin af [miconazole]   Current Medications (verified) Outpatient Encounter Medications as of 09/20/2024  Medication Sig   albuterol  (PROAIR  HFA) 108 (90 Base) MCG/ACT inhaler INHALE 2 PUFFS EVERY 4 HOURS AS NEEDED  ONLY IF YOU  CAN'T CATCH YOUR BREATH.   calcium -vitamin D  (OSCAL WITH D) 500-200 MG-UNIT per tablet Take 1 tablet by mouth daily.   cholecalciferol (VITAMIN D ) 1000 UNITS tablet Take 2,000 Units by mouth daily.    famotidine (PEPCID) 20 MG tablet Take by mouth one at bedtime   fluticasone  (FLONASE ) 50 MCG/ACT nasal spray Place 2 sprays into the nose daily as needed. For allergies   ibuprofen (ADVIL,MOTRIN) 200 MG tablet Take 600 mg by mouth every 6 (six) hours as needed. For pain   montelukast  (SINGULAIR ) 10 MG tablet Take 1 tablet (10 mg total) by mouth at bedtime. (Patient taking differently: Take 10 mg by mouth as needed.)   naproxen (NAPROSYN) 500 MG tablet Take 500 mg by mouth as needed.   Perfluorohexyloctane (MIEBO) 1.338 GM/ML SOLN Apply 1 vial to eye in the morning, at noon, in the evening, and at bedtime.   polyethylene glycol (MIRALAX / GLYCOLAX) 17 g packet Take 17 g by mouth daily. 2-3 times weekly   rosuvastatin  (CRESTOR ) 5 MG tablet Take 1 tablet (5 mg total) by mouth 2 (two) times a week.   spironolactone  (ALDACTONE ) 25 MG tablet Take 1 tablet (25 mg total) by mouth daily.   esomeprazole (NEXIUM) 20 MG capsule Take 20 mg by mouth daily at 12 noon. (Patient not taking: Reported on 09/20/2024)   No facility-administered encounter medications on file as of 09/20/2024.    History: Past Medical History:  Diagnosis Date   Asthma    Asthma    Phreesia 07/15/2020   Bronchitis 01/26/2017   GERD (gastroesophageal reflux disease)    Headache(784.0)    Pneumonia    PONV (postoperative nausea and vomiting)    Shortness of breath    Past Surgical History:  Procedure Laterality Date   ABDOMINAL HYSTERECTOMY     CHOLECYSTECTOMY     KNEE SURGERY Left    03/2023 meniscus   RHINOPLASTY     Family History  Problem Relation Age of Onset   Emphysema Father        smoked   Heart disease Father    Kidney cancer Father        with mets to liver and retina   Heart disease Mother     Social History   Occupational History   Occupation: Retired Administrator, Arts: [employer]  Tobacco Use   Smoking status: Never    Passive exposure: Never   Smokeless tobacco: Never  Vaping Use   Vaping status: Never Used  Substance and Sexual Activity   Alcohol use: Yes    Alcohol/week: 1.0 standard drink of alcohol    Types: 1 Glasses of wine per week    Comment: glass of wine approx twice per wk   Drug use: No   Sexual activity: Yes    Birth control/protection: Post-menopausal   Tobacco Counseling Counseling given: No  SDOH Screenings   Food Insecurity: No Food Insecurity (09/20/2024)  Housing: Low Risk (09/20/2024)  Transportation Needs: No Transportation Needs (09/20/2024)  Utilities: Not At Risk (09/20/2024)  Alcohol Screen: Low Risk (09/19/2024)  Depression (PHQ2-9): Low Risk (09/20/2024)  Financial Resource Strain: Low Risk (09/19/2024)  Physical Activity: Sufficiently Active (09/20/2024)  Social Connections: Socially Integrated (09/20/2024)  Stress: No Stress Concern Present (09/20/2024)  Tobacco Use: Low Risk (09/20/2024)  Health Literacy: Adequate Health Literacy (09/20/2024)   See flowsheets for full screening details  Depression Screen PHQ 2 & 9 Depression Scale- Over the past 2 weeks, how often have you  been bothered by any of the following problems? Little interest or pleasure in doing things: 0 Feeling down, depressed, or hopeless (PHQ Adolescent also includes...irritable): 0 PHQ-2 Total Score: 0 Trouble falling or staying asleep, or sleeping too much: 0 Feeling tired or having little energy: 0 Poor appetite or overeating (PHQ Adolescent also includes...weight loss): 0 Feeling bad about yourself - or that you are a failure or have let yourself or your family down: 0 Trouble concentrating on things, such as reading the newspaper or watching television (PHQ Adolescent also includes...like school work): 0 Moving or speaking so slowly that other people could have  noticed. Or the opposite - being so fidgety or restless that you have been moving around a lot more than usual: 0 Thoughts that you would be better off dead, or of hurting yourself in some way: 0 PHQ-9 Total Score: 0 If you checked off any problems, how difficult have these problems made it for you to do your work, take care of things at home, or get along with other people?: Not difficult at all  Depression Treatment Depression Interventions/Treatment : EYV7-0 Score <4 Follow-up Not Indicated     Goals Addressed               This Visit's Progress     Patient Stated (pt-stated)        Patient stated she plans to continue exercising - wants to lose weight (10lbs)             Objective:    Today's Vitals   09/20/24 1051  Weight: 213 lb (96.6 kg)  Height: 5' 10 (1.778 m)   Body mass index is 30.56 kg/m.  Hearing/Vision screen Hearing Screening - Comments:: Denies hearing difficulties   Vision Screening - Comments:: Wears rx glasses - up to date with routine eye exams with  [facility] Immunizations and Health Maintenance Health Maintenance  Topic Date Due   COVID-19 Vaccine (7 - 2025-26 season) 05/14/2024   Medicare Annual Wellness (AWV)  09/20/2025   Mammogram  07/16/2026   DTaP/Tdap/Td (3 - Td or Tdap) 09/11/2028   Colonoscopy  10/12/2032   Pneumococcal Vaccine: 50+ Years  Completed   Influenza Vaccine  Completed   Bone Density Scan  Completed   Hepatitis C Screening  Completed   Zoster Vaccines- Shingrix  Completed   Meningococcal B Vaccine  Aged Out        Assessment/Plan:  This is a routine wellness examination for Cindy Rogers.  Patient Care Team: Gayle Saddie JULIANNA DEVONNA as PCP - General (Physician Assistant) Darlean Ozell NOVAK, MD as Consulting Physician (Pulmonary Disease) Tess Agent, MD as Consulting Physician (Sports Medicine) Court Pulling, MD as Consulting Physician (Dermatology) Lavonia Lye, MD as Consulting Physician (Ophthalmology)  I have  personally reviewed and noted the following in the patients chart:   Medical and social history Use of alcohol, tobacco or illicit drugs  Current medications and supplements including opioid prescriptions. Functional ability and status Nutritional status Physical activity Advanced directives List of other physicians Hospitalizations, surgeries, and ER visits in previous 12 months Vitals Screenings to include cognitive, depression, and falls Referrals and appointments  No orders of the defined types were placed in this encounter.  In addition, I have reviewed and discussed with patient certain preventive protocols, quality metrics, and best practice recommendations. A written personalized care plan for preventive services as well as general preventive health recommendations were provided to patient.   Cindy Rogers, CMA   09/20/2024  Return in 1 year (on 09/20/2025).  After Visit Summary: (MyChart) Due to this being a telephonic visit, the after visit summary with patients personalized plan was offered to patient via MyChart   Nurse Notes: scheduled 2027 AWV appt "

## 2024-09-21 ENCOUNTER — Other Ambulatory Visit: Payer: Self-pay

## 2024-09-21 DIAGNOSIS — Z6830 Body mass index (BMI) 30.0-30.9, adult: Secondary | ICD-10-CM

## 2024-09-21 DIAGNOSIS — R7303 Prediabetes: Secondary | ICD-10-CM

## 2024-09-21 DIAGNOSIS — E785 Hyperlipidemia, unspecified: Secondary | ICD-10-CM

## 2024-09-21 DIAGNOSIS — E559 Vitamin D deficiency, unspecified: Secondary | ICD-10-CM

## 2024-09-27 ENCOUNTER — Other Ambulatory Visit

## 2024-09-27 DIAGNOSIS — Z6830 Body mass index (BMI) 30.0-30.9, adult: Secondary | ICD-10-CM

## 2024-09-27 DIAGNOSIS — E785 Hyperlipidemia, unspecified: Secondary | ICD-10-CM

## 2024-09-27 DIAGNOSIS — R7303 Prediabetes: Secondary | ICD-10-CM

## 2024-09-27 DIAGNOSIS — E559 Vitamin D deficiency, unspecified: Secondary | ICD-10-CM

## 2024-09-28 LAB — COMPREHENSIVE METABOLIC PANEL WITH GFR
ALT: 13 IU/L (ref 0–32)
AST: 17 IU/L (ref 0–40)
Albumin: 4.5 g/dL (ref 3.8–4.8)
Alkaline Phosphatase: 58 IU/L (ref 49–135)
BUN/Creatinine Ratio: 20 (ref 12–28)
BUN: 18 mg/dL (ref 8–27)
Bilirubin Total: 0.5 mg/dL (ref 0.0–1.2)
CO2: 23 mmol/L (ref 20–29)
Calcium: 10.6 mg/dL — ABNORMAL HIGH (ref 8.7–10.3)
Chloride: 108 mmol/L — ABNORMAL HIGH (ref 96–106)
Creatinine, Ser: 0.92 mg/dL (ref 0.57–1.00)
Globulin, Total: 1.8 g/dL (ref 1.5–4.5)
Glucose: 93 mg/dL (ref 70–99)
Potassium: 4.4 mmol/L (ref 3.5–5.2)
Sodium: 144 mmol/L (ref 134–144)
Total Protein: 6.3 g/dL (ref 6.0–8.5)
eGFR: 66 mL/min/1.73

## 2024-09-28 LAB — CBC WITH DIFFERENTIAL/PLATELET
Basophils Absolute: 0 x10E3/uL (ref 0.0–0.2)
Basos: 1 %
EOS (ABSOLUTE): 0.2 x10E3/uL (ref 0.0–0.4)
Eos: 4 %
Hematocrit: 41 % (ref 34.0–46.6)
Hemoglobin: 13.2 g/dL (ref 11.1–15.9)
Immature Grans (Abs): 0 x10E3/uL (ref 0.0–0.1)
Immature Granulocytes: 0 %
Lymphocytes Absolute: 1.5 x10E3/uL (ref 0.7–3.1)
Lymphs: 39 %
MCH: 30.6 pg (ref 26.6–33.0)
MCHC: 32.2 g/dL (ref 31.5–35.7)
MCV: 95 fL (ref 79–97)
Monocytes Absolute: 0.4 x10E3/uL (ref 0.1–0.9)
Monocytes: 10 %
Neutrophils Absolute: 1.8 x10E3/uL (ref 1.4–7.0)
Neutrophils: 46 %
Platelets: 192 x10E3/uL (ref 150–450)
RBC: 4.32 x10E6/uL (ref 3.77–5.28)
RDW: 12.9 % (ref 11.7–15.4)
WBC: 3.9 x10E3/uL (ref 3.4–10.8)

## 2024-09-28 LAB — LIPID PANEL
Chol/HDL Ratio: 2.6 ratio (ref 0.0–4.4)
Cholesterol, Total: 151 mg/dL (ref 100–199)
HDL: 58 mg/dL
LDL Chol Calc (NIH): 80 mg/dL (ref 0–99)
Triglycerides: 66 mg/dL (ref 0–149)
VLDL Cholesterol Cal: 13 mg/dL (ref 5–40)

## 2024-09-28 LAB — VITAMIN D 25 HYDROXY (VIT D DEFICIENCY, FRACTURES): Vit D, 25-Hydroxy: 44 ng/mL (ref 30.0–100.0)

## 2024-09-28 LAB — HEMOGLOBIN A1C
Est. average glucose Bld gHb Est-mCnc: 105 mg/dL
Hgb A1c MFr Bld: 5.3 % (ref 4.8–5.6)

## 2024-09-28 LAB — TSH: TSH: 3.34 u[IU]/mL (ref 0.450–4.500)

## 2024-09-30 ENCOUNTER — Ambulatory Visit: Payer: Self-pay

## 2024-10-04 ENCOUNTER — Other Ambulatory Visit

## 2024-10-04 ENCOUNTER — Ambulatory Visit (INDEPENDENT_AMBULATORY_CARE_PROVIDER_SITE_OTHER)

## 2024-10-04 ENCOUNTER — Other Ambulatory Visit: Payer: Self-pay

## 2024-10-04 ENCOUNTER — Other Ambulatory Visit (HOSPITAL_COMMUNITY): Payer: Self-pay

## 2024-10-04 DIAGNOSIS — R600 Localized edema: Secondary | ICD-10-CM | POA: Diagnosis not present

## 2024-10-04 DIAGNOSIS — K5904 Chronic idiopathic constipation: Secondary | ICD-10-CM | POA: Diagnosis not present

## 2024-10-04 DIAGNOSIS — J45991 Cough variant asthma: Secondary | ICD-10-CM | POA: Diagnosis not present

## 2024-10-04 DIAGNOSIS — E782 Mixed hyperlipidemia: Secondary | ICD-10-CM | POA: Diagnosis not present

## 2024-10-04 MED ORDER — MONTELUKAST SODIUM 10 MG PO TABS
10.0000 mg | ORAL_TABLET | ORAL | 2 refills | Status: AC | PRN
  Filled 2024-10-04: qty 90, 90d supply, fill #0

## 2024-10-04 MED ORDER — ROSUVASTATIN CALCIUM 5 MG PO TABS
5.0000 mg | ORAL_TABLET | ORAL | 3 refills | Status: AC
  Filled 2024-10-04: qty 90, 315d supply, fill #0

## 2024-10-04 NOTE — Assessment & Plan Note (Signed)
 Has not needed spironolactone  recently - only needs for lower leg swelling when traveling. Cont as needed use.

## 2024-10-04 NOTE — Assessment & Plan Note (Signed)
 Last lipid panel: LDL 80, HDL 58, Trig 66. The 10-year ASCVD risk score (Arnett DK, et al., 2019) is: 8.7% LDL continues to improve. Normal LFTs. Cont rosuvastatin  5 mg 3 times weekly

## 2024-10-04 NOTE — Patient Instructions (Signed)
 VISIT SUMMARY: During your visit, we reviewed your electrolyte levels, hyperlipidemia management, dry eye symptoms, constipation, and medication management. We made adjustments to your calcium  supplement intake and provided refills for your medications.  YOUR PLAN: COUGH VARIANT ASTHMA: Your asthma is managed with Singulair . -A refill for Singulair  has been sent to your pharmacy.  MIXED HYPERLIPIDEMIA: Your cholesterol levels have improved with rosuvastatin . -Continue taking rosuvastatin  twice a week. -A refill for rosuvastatin  has been sent to your pharmacy.  HYPERCALCEMIA: Your calcium  levels are slightly elevated, likely due to your calcium  supplements. -Reduce your calcium  supplement intake to one chew daily. -We will recheck your calcium  and parathyroid hormone levels in a couple of weeks.  CONSTIPATION: You have intermittent constipation managed with Miralax. -Add a fiber supplement like Metamucil to help bulk up your stools. -Adjust Miralax frequency as needed to achieve the desired stool consistency.  DRY EYE SYNDROME: You experience dry eyes and use TheraTears. -Continue using TheraTears eye drops four times a day.  If you have any problems before your next visit feel free to message me via MyChart (minor issues or questions) or call the office, otherwise you may reach out to schedule an office visit.  Thank you! Saddie Sacks, PA-C

## 2024-10-04 NOTE — Assessment & Plan Note (Signed)
 Patient reports that her symptoms are well-controlled with montelukast  10 mg daily, albuterol  prn. Cont these medications.

## 2024-10-04 NOTE — Progress Notes (Signed)
 "  Established Patient Office Visit  Subjective   Patient ID: Cindy Rogers, female    DOB: 19-Sep-1950  Age: 74 y.o. MRN: [id number]  Chief Complaint  Patient presents with   Medical Management of Chronic Issues    HPI  History of Present Illness   Cindy Rogers is a 74 year old female who presents for a routine follow-up visit.  Electrolyte abnormalities and supplement use - Slightly elevated calcium  and chloride levels on recent labs - Currently takes two calcium  chews daily and occasionally uses Tums - Fasting prior to laboratory testing - No significant symptoms related to electrolyte abnormalities  Hyperlipidemia management - Takes rosuvastatin  twice weekly since July and tolerating medication well  - Cholesterol levels have improved with current regimen  Dry eye symptoms - Experiences dry eyes - Uses TheraTears four times daily recommended by her retina specialist  - Previously used Miebo but discontinued due to cost and lack of significant benefit  Constipation - Ongoing constipation managed with Miralax three times weekly - Miralax use sometimes results in very soft stools and gas - Previously used fiber supplements but not currently using them  Edema and diuretic use - Uses spironolactone  as needed, particularly when traveling - No significant issues with fluid retention recently - Plans to wear compression socks during upcoming travel to [place]  Medication management - Takes vitamin D  supplement - Takes Singulair  and requires a refill - Occasionally uses Nexium          ROS Per HPI.    Objective:     BP 106/71   Pulse 70   Temp 98.1 F (36.7 C) (Oral)   Ht 5' 10 (1.778 m)   Wt 215 lb 1.9 oz (97.6 kg)   SpO2 97%   BMI 30.87 kg/m    Physical Exam Constitutional:      General: She is not in acute distress.    Appearance: Normal appearance.  Cardiovascular:     Rate and Rhythm: Normal rate and regular rhythm.     Heart sounds: Normal  heart sounds. No murmur heard.    No friction rub. No gallop.  Pulmonary:     Effort: Pulmonary effort is normal. No respiratory distress.     Breath sounds: Normal breath sounds.  Musculoskeletal:        General: No swelling.     Cervical back: Neck supple.     Right lower leg: No edema.     Left lower leg: No edema.  Lymphadenopathy:     Cervical: No cervical adenopathy.  Skin:    General: Skin is warm and dry.  Neurological:     General: No focal deficit present.     Mental Status: She is alert.  Psychiatric:        Mood and Affect: Mood normal.        Behavior: Behavior normal.        Thought Content: Thought content normal.      No results found for any visits on 10/04/24.  Last CBC Lab Results  Component Value Date   WBC 3.9 09/27/2024   HGB 13.2 09/27/2024   HCT 41.0 09/27/2024   MCV 95 09/27/2024   MCH 30.6 09/27/2024   RDW 12.9 09/27/2024   PLT 192 09/27/2024   Last metabolic panel Lab Results  Component Value Date   GLUCOSE 93 09/27/2024   NA 144 09/27/2024   K 4.4 09/27/2024   CL 108 (H) 09/27/2024   CO2 23 09/27/2024  BUN 18 09/27/2024   CREATININE 0.92 09/27/2024   EGFR 66 09/27/2024   CALCIUM  10.6 (H) 09/27/2024   PROT 6.3 09/27/2024   ALBUMIN 4.5 09/27/2024   LABGLOB 1.8 09/27/2024   AGRATIO 2.5 (H) 02/16/2023   BILITOT 0.5 09/27/2024   ALKPHOS 58 09/27/2024   AST 17 09/27/2024   ALT 13 09/27/2024   Last lipids Lab Results  Component Value Date   CHOL 151 09/27/2024   HDL 58 09/27/2024   LDLCALC 80 09/27/2024   LDLDIRECT 117 (H) 02/01/2022   TRIG 66 09/27/2024   CHOLHDL 2.6 09/27/2024   Last hemoglobin A1c Lab Results  Component Value Date   HGBA1C 5.3 09/27/2024   Last thyroid  functions Lab Results  Component Value Date   TSH 3.340 09/27/2024   T3TOTAL 105 08/29/2018   FREET4 1.09 08/29/2018   Last vitamin D  Lab Results  Component Value Date   VD25OH 44.0 09/27/2024      The 10-year ASCVD risk score (Arnett DK,  et al., 2019) is: 8.7%    Assessment & Plan:   Hypercalcemia Assessment & Plan: Persistently mildly elevated Ca2+ level. Most recent 10.6. Advised to reduce calcium  chews from 2 to 1 per day. Rechecking in 4 weeks with PTH. Will cont to monitor.  Orders: -     PTH, intact and calcium ; Future  Cough variant asthma -     Montelukast  Sodium; Take 1 tablet (10 mg total) by mouth as needed.  Dispense: 90 tablet; Refill: 2  Intrinsic asthma Assessment & Plan: Patient reports that her symptoms are well-controlled with montelukast  10 mg daily, albuterol  prn. Cont these medications.  Orders: -     Montelukast  Sodium; Take 1 tablet (10 mg total) by mouth as needed.  Dispense: 90 tablet; Refill: 2  Mixed hyperlipidemia Assessment & Plan: Last lipid panel: LDL 80, HDL 58, Trig 66. The 10-year ASCVD risk score (Arnett DK, et al., 2019) is: 8.7% LDL continues to improve. Normal LFTs. Cont rosuvastatin  5 mg 3 times weekly  Orders: -     Rosuvastatin  Calcium ; Take 1 tablet (5 mg total) by mouth 2 (two) times a week.  Dispense: 90 tablet; Refill: 3  Chronic idiopathic constipation Assessment & Plan: Intermittent constipation managed with Miralax three times a week. Occasional soft stools causing difficulty in expulsion. - Add fiber supplement such as Metamucil to help bulk up stools. - Adjust Miralax frequency as needed to achieve desired stool consistency.   Lower extremity edema Assessment & Plan: Has not needed spironolactone  recently - only needs for lower leg swelling when traveling. Cont as needed use.       Return in about 6 months (around 04/03/2025) for HLD, pre-DM.    Cindy JULIANNA Sacks, PA-C "

## 2024-10-04 NOTE — Assessment & Plan Note (Signed)
 Persistently mildly elevated Ca2+ level. Most recent 10.6. Advised to reduce calcium  chews from 2 to 1 per day. Rechecking in 4 weeks with PTH. Will cont to monitor.

## 2024-10-04 NOTE — Assessment & Plan Note (Signed)
 Intermittent constipation managed with Miralax three times a week. Occasional soft stools causing difficulty in expulsion. - Add fiber supplement such as Metamucil to help bulk up stools. - Adjust Miralax frequency as needed to achieve desired stool consistency.

## 2024-10-11 ENCOUNTER — Ambulatory Visit

## 2024-10-26 ENCOUNTER — Other Ambulatory Visit

## 2025-03-28 ENCOUNTER — Other Ambulatory Visit

## 2025-04-04 ENCOUNTER — Ambulatory Visit

## 2025-10-03 ENCOUNTER — Ambulatory Visit
